# Patient Record
Sex: Female | Born: 1945
Health system: Southern US, Community
[De-identification: ages and names within clinical notes are randomized; demographics above are authoritative.]

## PROBLEM LIST (undated history)

## (undated) DIAGNOSIS — IMO0001 Reserved for inherently not codable concepts without codable children: Secondary | ICD-10-CM

## (undated) DIAGNOSIS — I10 Essential (primary) hypertension: Secondary | ICD-10-CM

## (undated) DIAGNOSIS — I739 Peripheral vascular disease, unspecified: Secondary | ICD-10-CM

## (undated) DIAGNOSIS — N289 Disorder of kidney and ureter, unspecified: Secondary | ICD-10-CM

## (undated) DIAGNOSIS — J439 Emphysema, unspecified: Secondary | ICD-10-CM

## (undated) DIAGNOSIS — K5790 Diverticulosis of intestine, part unspecified, without perforation or abscess without bleeding: Secondary | ICD-10-CM

## (undated) DIAGNOSIS — J449 Chronic obstructive pulmonary disease, unspecified: Secondary | ICD-10-CM

## (undated) DIAGNOSIS — F419 Anxiety disorder, unspecified: Secondary | ICD-10-CM

## (undated) HISTORY — DX: Essential (primary) hypertension: I10

## (undated) HISTORY — DX: Emphysema, unspecified: J43.9

## (undated) HISTORY — DX: Anxiety disorder, unspecified: F41.9

## (undated) HISTORY — DX: Peripheral vascular disease, unspecified: I73.9

---

## 2012-05-26 ENCOUNTER — Emergency Department (INDEPENDENT_AMBULATORY_CARE_PROVIDER_SITE_OTHER)
Admission: EM | Admit: 2012-05-26 | Discharge: 2012-05-26 | Disposition: A | Discharge status: Home / Self Care | Payer: Managed Care, Other (non HMO) | Source: Home / Self Care | Attending: Emergency Medicine | Admitting: Emergency Medicine

## 2012-05-26 DIAGNOSIS — J0100 Acute maxillary sinusitis, unspecified: Secondary | ICD-10-CM

## 2012-05-26 HISTORY — DX: Essential (primary) hypertension: I10

## 2012-05-26 HISTORY — DX: Chronic obstructive pulmonary disease, unspecified: J44.9

## 2012-05-26 MED ORDER — FLUTICASONE PROPIONATE 50 MCG/ACT NA SUSP: NASAL | Status: DC

## 2012-05-26 MED ORDER — SULFAMETHOXAZOLE-TRIMETHOPRIM 800-160 MG PO TABS: 1.0000 | ORAL_TABLET | Freq: Two times a day (BID) | ORAL | Status: AC

## 2012-05-26 NOTE — ED Provider Notes (Signed)
History     CSN: [id number]  Arrival date & time 05/26/12  1402   First MD Initiated Contact with Patient 05/26/12 1405      Chief Complaint  Patient presents with  . URI     The history is provided by the patient.  SINUSITIS  Onset: 3-4 days Facial/sinus pressure with discolored nasal mucus.    Severity: moderate Tried OTC meds without significant relief.  Symptoms:  + Fever  + URI prodrome with nasal congestion + Minimal swollen neck glands + mild Sinus Headache + mild ear pressure  No Allergy symptoms No significant Sore Throat No eye symptoms     No significant Cough No chest pain No shortness of breath  No wheezing She has a history of COPD, but she does not feel like this is an exacerbation. She quit smoking 18 months ago.  No Abdominal Pain No Nausea No Vomiting No diarrhea  No Myalgias No focal neurologic symptoms No syncope No Rash  No Urinary symptoms           Past Medical History  Diagnosis Date  . COPD (chronic obstructive pulmonary disease)   . Hypertension     History reviewed. No pertinent past surgical history.  Family History  Problem Relation Age of Onset  . Cancer Mother     History  Substance Use Topics  . Smoking status: Former Games developer  . Smokeless tobacco: Not on file  . Alcohol Use: No    OB History    Grav Para Term Preterm Abortions TAB SAB Ect Mult Living                  Review of Systems  All other systems reviewed and are negative.    Allergies  Codeine and Levaquin  Home Medications   Current Outpatient Rx  Name Route Sig Dispense Refill  . AMLODIPINE BESYLATE 10 MG PO TABS Oral Take 10 mg by mouth daily.    [name] TIOTROPIUM BROMIDE MONOHYDRATE 18 MCG IN CAPS Inhalation Place 18 mcg into inhaler and inhale daily.    [name] FLUTICASONE PROPIONATE 50 MCG/ACT NA SUSP  1 or 2 sprays each nostril twice a day 16 g 0  . SULFAMETHOXAZOLE-TRIMETHOPRIM 800-160 MG PO TABS Oral Take 1 tablet by mouth 2 (two)  times daily. 20 tablet 0    BP 172/76  Pulse 85  Temp 98 F (36.7 C) (Oral)  Resp 24  Ht 5\' 2"  (1.575 m)  Wt 106 lb 8 oz (48.308 kg)  BMI 19.48 kg/m2  SpO2 92% Repeat pulse ox 94% on room air, and she states her normal baseline is 92-94%. Physical Exam  Nursing note and vitals reviewed. Constitutional: She is oriented to person, place, and time. She appears well-developed and well-nourished. No distress.  HENT:  Head: Normocephalic and atraumatic.  Right Ear: Tympanic membrane, external ear and ear canal normal.  Left Ear: Tympanic membrane, external ear and ear canal normal.  Nose: Mucosal edema and rhinorrhea present. Right sinus exhibits maxillary sinus tenderness. Left sinus exhibits maxillary sinus tenderness.  Mouth/Throat: Oropharynx is clear and moist. No oral lesions. No oropharyngeal exudate.  Eyes: Right eye exhibits no discharge. Left eye exhibits no discharge. No scleral icterus.  Neck: Neck supple.  Cardiovascular: Normal rate, regular rhythm and normal heart sounds.   Pulmonary/Chest: Effort normal and breath sounds normal. She has no wheezes. She has no rales.  Lymphadenopathy:    She has no cervical adenopathy.  Neurological: She is alert and oriented  to person, place, and time.  Skin: Skin is warm and dry.   no cyanosis or edema in extremities.  ED Course  Procedures (including critical care time)  Labs Reviewed - No data to display No results found.   1. Acute maxillary sinusitis      MDM  We reviewed treatment options for the acute maxillary sinusitis. She is allergic to Levaquin, and she states that amoxicillin and penicillins and Z-Pak have not been effective for her in the past. After risks, benefits, alternatives discussed, I prescribed Septra DS twice a day x10 days. Flonase prescribed with instructions and precautions. Continue her other chronic baseline medications. Red flags discussed. She states that she and husband just moved to the area to  live closer to their children, and I advised that she establish with a PCP. Names and phone numbers of the PCPs here in the [facility] primary care office given.        Lajean Manes, MD 05/26/12 [phone]

## 2012-05-27 ENCOUNTER — Telehealth: Payer: Self-pay | Admitting: Emergency Medicine

## 2012-05-27 NOTE — ED Notes (Signed)
Patient called having severe abdominal pain from Septra.  Please prescribe something else.

## 2012-06-24 ENCOUNTER — Encounter: Payer: Self-pay | Admitting: Family Medicine

## 2012-06-24 ENCOUNTER — Ambulatory Visit (INDEPENDENT_AMBULATORY_CARE_PROVIDER_SITE_OTHER): Payer: Managed Care, Other (non HMO) | Admitting: Family Medicine

## 2012-06-24 VITALS — BP 148/76 | HR 93 | Wt 106.0 lb

## 2012-06-24 DIAGNOSIS — Z0000 Encounter for general adult medical examination without abnormal findings: Secondary | ICD-10-CM

## 2012-06-24 DIAGNOSIS — I739 Peripheral vascular disease, unspecified: Secondary | ICD-10-CM

## 2012-06-24 DIAGNOSIS — J439 Emphysema, unspecified: Secondary | ICD-10-CM

## 2012-06-24 DIAGNOSIS — J438 Other emphysema: Secondary | ICD-10-CM

## 2012-06-24 DIAGNOSIS — F419 Anxiety disorder, unspecified: Secondary | ICD-10-CM

## 2012-06-24 DIAGNOSIS — I10 Essential (primary) hypertension: Secondary | ICD-10-CM

## 2012-06-24 DIAGNOSIS — Z23 Encounter for immunization: Secondary | ICD-10-CM

## 2012-06-24 HISTORY — DX: Anxiety disorder, unspecified: F41.9

## 2012-06-24 HISTORY — DX: Essential (primary) hypertension: I10

## 2012-06-24 HISTORY — DX: Emphysema, unspecified: J43.9

## 2012-06-24 HISTORY — DX: Peripheral vascular disease, unspecified: I73.9

## 2012-06-24 NOTE — Progress Notes (Signed)
CC: Catherine Phillips is a 66 y.o. female is here for Establish Care   Subjective: HPI:  Pleasant 66 year old here to establish care, recently moved from [place].   Her only concern today is some mild transient tingling that occurs in all the fingertips and occasionally the toes is not precipitated by anything in particular, result was done without particular interventions. Has been present for weeks to years without any workup. She denies any other motor or sensory disturbances.  She has a history of emphysema and has been using spirivia without a daily cough nor shortness of breath at rest or with exertion. She's a former smoker stopped a little over a year ago after being hospitalized with pneumonia.  Get a history of essential hypertension for which she takes amlodipine. She denies headaches, chest pain, irregular heartbeat, peripheral edema, orthopnea, nor paroxysmal nocturnal dyspnea.  She tells me she has a history of left leg claudication, it sounds like she had ABIs done which she's unsure of the exact results. She tells me her former physician interpreted results as her only needing to start a daily walking regimen which she has abided by for years.  She denies any current claudication or rest pain and states that this is absent provided she walks on a daily basis. She's never been on medication for claudication. She can walk for over 30 minutes without pain in the legs, she can pretty much do whatever she wants without leg pain.  She's a history of anxiety that only bothers her once every one to 2 weeks and responds to Xanax. She tells me she uses this very sparingly, once every one to 2 weeks. She denies depression or mental disturbance or history of substance abuse/dependence other than tobacco.   Review Of Systems Outlined In HPI  Past Medical History  Diagnosis Date  . COPD (chronic obstructive pulmonary disease)   . Hypertension   . Emphysema of lung   . Anxiety  06/24/2012  . Essential hypertension 06/24/2012  . Emphysema 06/24/2012  . History of Left leg claudication 06/24/2012     Family History  Problem Relation Age of Onset  . Cancer Mother      History  Substance Use Topics  . Smoking status: Former Games developer  . Smokeless tobacco: Not on file  . Alcohol Use: No     Objective: Filed Vitals:   06/24/12 0958  BP: 148/76  Pulse: 93    General: Alert and Oriented, No Acute Distress HEENT: Pupils equal, round, reactive to light. Conjunctivae clear.  External ears unremarkable, canals clear with intact TMs with appropriate landmarks.  Middle ear appears open without effusion. Pink inferior turbinates.  Moist mucous membranes, pharynx without inflammation nor lesions.  Neck supple without palpable lymphadenopathy nor abnormal masses. Lungs: Clear to auscultation bilaterally, no wheezing/ronchi/rales.  Comfortable work of breathing. Good air movement. Cardiac: Regular rate and rhythm. Normal S1/S2.  No murmurs, rubs, nor gallops.  No carotid bruits. 1+ posterior tibialis pulse bilaterally. Extremities: No peripheral edema.  Mental Status: No depression, anxiety, nor agitation. Skin: Warm and dry.  Assessment & Plan: Catherine Phillips was seen today for establish care.  Diagnoses and associated orders for this visit:  Routine health maintenance - Lipid panel - COMPLETE METABOLIC PANEL WITH GFR - Flu vaccine greater than or equal to 3yo with preservative IM  History of left leg claudication  Emphysema  Essential hypertension  She'll return in approximately 2 weeks for a complete physical exam her blood pressure continues to be  elevated will adjust medications appropriately. She's due for cholesterol check in all rule out lateral abnormalities including calcium with a CMP due to her tingling. She will have these labs done right before her CPE. Her leg claudication some stable however went over signs and symptoms that would necessitate ABI testing,  we'll discuss aspirin use at her CPE visit. She continue on Spiriva I congratulated her on her tobacco cessation, emphysema sounds stable as she is asymptomatic.  Flu shot today   Return in about 2 weeks (around 07/08/2012) for CPE.

## 2012-06-25 LAB — COMPLETE METABOLIC PANEL WITH GFR
ALT: <8 U/L (ref 0–35)
CO2: 27 mEq/L (ref 19–32)
Creat: 0.77 mg/dL (ref 0.50–1.10)
GFR, Est African American: >89 mL/min
GFR, Est Non African American: 81 mL/min
Total Bilirubin: 0.4 mg/dL (ref 0.3–1.2)

## 2012-06-25 LAB — LIPID PANEL
HDL: 41 mg/dL (ref >39)
LDL Cholesterol: 201 mg/dL — ABNORMAL HIGH (ref 0–99)
Triglycerides: 298 mg/dL — ABNORMAL HIGH (ref <150)

## 2012-06-26 ENCOUNTER — Telehealth: Payer: Self-pay | Admitting: Family Medicine

## 2012-06-26 DIAGNOSIS — E785 Hyperlipidemia, unspecified: Secondary | ICD-10-CM | POA: Insufficient documentation

## 2012-06-26 MED ORDER — ATORVASTATIN CALCIUM 40 MG PO TABS: 40.0000 mg | ORAL_TABLET | Freq: Every day | ORAL | Status: DC

## 2012-06-26 NOTE — Telephone Encounter (Signed)
[place] 10 year risk of 13% if not considering her questionable history of PVD.  Agrees to start Lipitor.

## 2012-07-01 ENCOUNTER — Encounter: Payer: Self-pay | Admitting: Family Medicine

## 2012-07-01 ENCOUNTER — Ambulatory Visit (INDEPENDENT_AMBULATORY_CARE_PROVIDER_SITE_OTHER): Payer: Managed Care, Other (non HMO) | Admitting: Family Medicine

## 2012-07-01 VITALS — BP 134/68 | HR 74 | Temp 97.5°F | Resp 18 | Ht 60.5 in | Wt 106.0 lb

## 2012-07-01 DIAGNOSIS — Z0000 Encounter for general adult medical examination without abnormal findings: Secondary | ICD-10-CM

## 2012-07-01 MED ORDER — ZOSTER VACCINE LIVE 19400 UNT/0.65ML ~~LOC~~ SOLR: 0.6500 mL | Freq: Once | SUBCUTANEOUS | Status: DC

## 2012-07-01 NOTE — Progress Notes (Signed)
CC: Catherine Phillips is a 66 y.o. female is here for Annual Exam  Colonoscopy: Most recent was performed approximately 4 years ago patient is under the impression that she was given a 5 year clearance and will be due for colonoscopy next year. Papsmear:no history of abnormals, last one was two years ago, she is thinking about having this done with GYN Mammogram: no history of abnormals, she's due for this  DEXA: History of all normal, last one five years ago.  Will order 07/01/2012  Influenza Vaccine: received last week Pneumovax: Received after 65 Td/Tdap: She believes she had one 2 years ago Zoster: given rx 07/01/2012   Subjective: HPI:  She has no complaints today she is here for her annual physical exam.  Review of Systems - General ROS: negative for - chills, fever, night sweats, weight gain or weight loss Ophthalmic ROS: negative for - decreased vision Psychological ROS: negative for - anxiety or depression ENT ROS: negative for - hearing change, nasal congestion, tinnitus or allergies Hematological and Lymphatic ROS: negative for - bleeding problems, bruising or swollen lymph nodes Breast ROS: negative Respiratory ROS: no cough, shortness of breath, or wheezing Cardiovascular ROS: no chest pain or dyspnea on exertion Gastrointestinal ROS: no abdominal pain, change in bowel habits, or black or bloody stools Genito-Urinary ROS: negative for - genital discharge, genital ulcers, incontinence or abnormal bleeding from genitals Musculoskeletal ROS: negative for - joint pain or muscle pain Neurological ROS: negative for - headaches or memory loss Dermatological ROS: negative for lumps, mole changes, rash and skin lesion changes  Past Medical History  Diagnosis Date  . COPD (chronic obstructive pulmonary disease)   . Hypertension   . Emphysema of lung   . Anxiety 06/24/2012  . Essential hypertension 06/24/2012  . Emphysema 06/24/2012  . History of Left leg claudication 06/24/2012     Family History  Problem Relation Age of Onset  . Cancer Mother   . Heart attack Father      History  Substance Use Topics  . Smoking status: Former Smoker -- 1.5 packs/day for 30 years    Types: Cigarettes    Quit date: 12/30/2009  . Smokeless tobacco: Not on file  . Alcohol Use: No     Objective: Filed Vitals:   07/01/12 0934  BP: 134/68  Pulse: 74  Temp: 97.5 F (36.4 C)  Resp: 18    General: No Acute Distress HEENT: Atraumatic, normocephalic, conjunctivae normal without scleral icterus.  No nasal discharge, hearing grossly intact, TMs with good landmarks bilaterally with no middle ear abnormalities, posterior pharynx clear without oral lesions. Neck: Supple, trachea midline, no cervical nor supraclavicular adenopathy. Pulmonary: Clear to auscultation bilaterally without wheezing, rhonchi, nor rales. Cardiac: Regular rate and rhythm.  No murmurs, rubs, nor gallops. No peripheral edema.  2+ peripheral pulses bilaterally. Abdomen: Bowel sounds normal.  No masses.  Non-tender without rebound.  Negative Murphy's sign. GU: Declined by patient  MSK: Grossly intact, no signs of weakness.  Full strength throughout upper and lower extremities.  Full ROM in upper and lower extremities.  No midline spinal tenderness. Neuro: Gait unremarkable, CN II-XII grossly intact.  C5-C6 Reflex 2/4 Bilaterally, L4 Reflex 2/4 Bilaterally.  Cerebellar function intact. Skin: No rashes. Psych: Alert and oriented to person/place/time.  Thought process normal. No anxiety/depression.   Assessment & Plan: Catherine Phillips was seen today for annual exam.  Diagnoses and associated orders for this visit:  Encounter for health maintenance examination - zoster vaccine live, PF, (ZOSTAVAX) 16109  UNT/0.65ML injection; Inject 19,400 Units into the skin once. - MM Digital Screening; Future - DG Bone Density; Future  Other Orders - fluticasone (FLONASE) 50 MCG/ACT nasal spray; as needed. 1 or 2 sprays each  nostril twice a day    Health maintenance topics were updated about the history of present illness. She's due for mammogram, DEXA, Zostavax. We'll arrange that for her today and have the procedures done in the near future. Handout on health maintenance topics including diet exercise and healthy lifestyles was given to the patient along with discussing this face-to-face. Asked her to return in 3 months for fasting cholesterol checked since she started Lipitor last week.  Return in about 3 months (around 10/01/2012).

## 2012-07-01 NOTE — Patient Instructions (Addendum)
Dr. Erlene Devita's General Advice Following Your Complete Physical Exam  The Benefits of Regular Exercise: Unless you suffer from an uncontrolled cardiovascular condition, studies strongly suggest that regular exercise and physical activity will add to both the quality and length of your life.  The World Health Organization recommends 150 minutes of moderate intensity aerobic activity every week.  This is best split over 3-4 days a week, and can be as simple as a brisk walk for just over 35 minutes "most days of the week".  This type of exercise has been shown to lower LDL-Cholesterol, lower average blood sugars, lower blood pressure, lower cardiovascular disease risk, improve memory, and increase one's overall sense of wellbeing.  The addition of anaerobic (or "strength training") exercises offers additional benefits including but not limited to increased metabolism, prevention of osteoporosis, and improved overall cholesterol levels.  How Can I Strive For A Low-Fat Diet?: Current guidelines recommend that 25-35 percent of your daily energy (food) intake should come from fats.  One might ask how can this be achieved without having to dissect each meal on a daily basis?  Switch to skim or 1% milk instead of whole milk.  Focus on lean meats such as ground turkey, fresh fish, baked chicken, and lean cuts of beef as your source of dietary protein.  Consume less than 300mg/day of dietary cholesterol.  Limit trans fatty acid consumption primarily by limiting synthetic trans fats such as partially hydrogenated oils (Ex: fried fast foods).  Focus efforts on reducing your intake of "solid" fats (Ex: Butter).  Substitute olive or vegetable oil for solid fats where possible.  Moderation of Salt Intake: Provided you don't carry a diagnosis of congestive heart failure nor renal failure, I recommend a daily allowance of no more than 2300 mg of salt (sodium).  Keeping under this daily goal is associated with a  decreased risk of cardiovascular events, creeping above it can lead to elevated blood pressures and increases your risk of cardiovascular events.  Milligrams (mg) of salt is listed on all nutrition labels, and your daily intake can add up faster than you think.  Most canned and frozen dinners can pack in over half your daily salt allowance in one meal.    Lifestyle Health Risks: Certain lifestyle choices carry specific health risks.  As you may already know, tobacco use has been associated with increasing one's risk of cardiovascular disease, pulmonary disease, numerous cancers, among many other issues.  What you may not know is that there are medications and nicotine replacement strategies that can more than double your chances of successfully quitting.  I would be thrilled to help manage your quitting strategy if you currently use tobacco products.  When it comes to alcohol use, I've yet to find an "ideal" daily allowance.  Provided an individual does not have a medical condition that is exacerbated by alcohol consumption, general guidelines determine "safe drinking" as no more than two standard drinks for a man or no more than one standard drink for a female per day.  However, much debate still exists on whether any amount of alcohol consumption is technically "safe".  My general advice, keep alcohol consumption to a minimum for general health promotion.  If you or others believe that alcohol, tobacco, or recreational drug use is interfering with your life, I would be happy to provide confidential counseling regarding treatment options.  General "Over The Counter" Nutrition Advice: Postmenopausal women should aim for a daily calcium intake of 1200 mg, however a significant   portion of this might already be provided by diets including milk, yogurt, cheese, and other dairy products.  Vitamin D has been shown to help preserve bone density, prevent fatigue, and has even been shown to help reduce falls in the  elderly.  Ensuring a daily intake of 800 Units of Vitamin D is a good place to start to enjoy the above benefits, we can easily check your Vitamin D level to see if you'd potentially benefit from supplementation beyond 800 Units a day.  Folic Acid intake should be of particular concern to women of childbearing age.  Daily consumption of 400-800 mcg of Folic Acid is recommended to minimize the chance of spinal cord defects in a fetus should pregnancy occur.    For many adults, accidents still remain one of the most common culprits when it comes to cause of death.  Some of the simplest but most effective preventitive habits you can adopt include regular seatbelt use, proper helmet use, securing firearms, and regularly testing your smoke and carbon monoxide detectors.  Bryleigh Ottaway B. Vonnetta Akey DO [facility] [address], [address] [place], [place] [address] Phone: [phone]  

## 2012-07-04 ENCOUNTER — Ambulatory Visit (INDEPENDENT_AMBULATORY_CARE_PROVIDER_SITE_OTHER): Payer: Managed Care, Other (non HMO)

## 2012-07-04 DIAGNOSIS — Z1231 Encounter for screening mammogram for malignant neoplasm of breast: Secondary | ICD-10-CM

## 2012-07-04 DIAGNOSIS — Z0000 Encounter for general adult medical examination without abnormal findings: Secondary | ICD-10-CM

## 2012-08-29 ENCOUNTER — Encounter: Payer: Self-pay | Admitting: Sports Medicine

## 2012-08-29 ENCOUNTER — Ambulatory Visit (INDEPENDENT_AMBULATORY_CARE_PROVIDER_SITE_OTHER): Payer: Managed Care, Other (non HMO)

## 2012-08-29 ENCOUNTER — Ambulatory Visit (INDEPENDENT_AMBULATORY_CARE_PROVIDER_SITE_OTHER): Payer: Managed Care, Other (non HMO) | Admitting: Sports Medicine

## 2012-08-29 VITALS — BP 159/82 | HR 96 | Temp 97.8°F | Wt 109.0 lb

## 2012-08-29 DIAGNOSIS — R059 Cough, unspecified: Secondary | ICD-10-CM

## 2012-08-29 DIAGNOSIS — J439 Emphysema, unspecified: Secondary | ICD-10-CM

## 2012-08-29 DIAGNOSIS — R05 Cough: Secondary | ICD-10-CM

## 2012-08-29 DIAGNOSIS — J438 Other emphysema: Secondary | ICD-10-CM

## 2012-08-29 DIAGNOSIS — R918 Other nonspecific abnormal finding of lung field: Secondary | ICD-10-CM

## 2012-08-29 DIAGNOSIS — R509 Fever, unspecified: Secondary | ICD-10-CM

## 2012-08-29 MED ORDER — PREDNISONE 50 MG PO TABS: 50.0000 mg | ORAL_TABLET | Freq: Every day | ORAL | Status: DC

## 2012-08-29 MED ORDER — METHYLPREDNISOLONE ACETATE 40 MG/ML IJ SUSP
250.0000 mg | Freq: Once | INTRAMUSCULAR | Status: AC
  Administered 2012-08-29: 250 mg via INTRAMUSCULAR

## 2012-08-29 MED ORDER — BENZONATATE 200 MG PO CAPS: 200.0000 mg | ORAL_CAPSULE | Freq: Three times a day (TID) | ORAL | Status: DC | PRN

## 2012-08-29 MED ORDER — DOXYCYCLINE HYCLATE 100 MG PO TABS: 100.0000 mg | ORAL_TABLET | Freq: Two times a day (BID) | ORAL | Status: AC

## 2012-08-29 NOTE — Assessment & Plan Note (Addendum)
Currently in exacerbation. Solu-Medrol 250 mg intramuscular, duo nebs. Doxycycline, chest x-ray, prednisone for 5 days. Benzonatate for cough. She'll come back to see [place] if no better by tomorrow.

## 2012-08-29 NOTE — Progress Notes (Signed)
Subjective:    CC: Cough  HPI: This is a very pleasant 66 year old female with a history of emphysema comes in with a several-day history of increasing cough with sputum production, shortness of breath, and upper respiratory symptoms. The cough is fairly severe, causes for mild shortness of breath, and is productive of yellowish sputum without blood. It does bother her at night as well. She has also noted increased use of both her inhaler, and albuterol nebulizers. When not sick, she notes that she is asymptomatic. She denies fevers, chills, night sweats, weight loss, GI symptoms, rash.  Past medical history, Surgical history, Family history, Social history, Allergies, and medications have been entered into the medical record, reviewed, and no changes needed.   Review of Systems: No fevers, chills, night sweats, weight loss, chest pain, or shortness of breath.   Objective:    General: Well Developed, well nourished, and in no acute distress.  Neuro: Alert and oriented x3, extra-ocular muscles intact.  HEENT: Normocephalic, atraumatic, pupils equal round reactive to light, neck supple, no masses, no lymphadenopathy, thyroid nonpalpable. Oropharynx, nasopharynx, tympanic membranes are unremarkable to inspection. Skin: Warm and dry, no rashes. Cardiac: Regular rate and rhythm, no murmurs rubs or gallops.  Respiratory: Clear to auscultation bilaterally. Not using accessory muscles, speaking in full sentences. Coughing occasionally in exam room.  Solu-Medrol 250 mg intramuscular, and albuterol/Atrovent nebulizer treatment given in the office, patient felt better.  Oxygen saturation improved from 91 to 94%. This was on room air.  Impression and Recommendations:

## 2012-09-02 ENCOUNTER — Ambulatory Visit (INDEPENDENT_AMBULATORY_CARE_PROVIDER_SITE_OTHER): Payer: Managed Care, Other (non HMO) | Admitting: Family Medicine

## 2012-09-02 ENCOUNTER — Encounter: Payer: Self-pay | Admitting: Family Medicine

## 2012-09-02 VITALS — BP 140/80 | Wt 110.0 lb

## 2012-09-02 DIAGNOSIS — Z13820 Encounter for screening for osteoporosis: Secondary | ICD-10-CM

## 2012-09-02 DIAGNOSIS — J439 Emphysema, unspecified: Secondary | ICD-10-CM

## 2012-09-02 DIAGNOSIS — J438 Other emphysema: Secondary | ICD-10-CM

## 2012-09-02 NOTE — Progress Notes (Signed)
CC: Catherine Phillips is a 66 y.o. female is here for discuss oxygen for the home   Subjective: HPI:  Patient presents due to concerns of an oxygen saturation of 72% at the last visit, last week.  She has been on oxygen the past during her recovery from a pneumonia. Since starting doxycycline and prednisone last week she states her breathing is fantastic, she denies fevers, chills, shortness of breath, chest tightness, cough, chest pain, nor back pain. She's been using her albuterol 2 to 3 times a day, sometimes using the nebulizer because she believes works better. She continues to Spiriva twice a day. She does not believe that she's never been on an inhaled corticosteroid.   Review Of Systems Outlined In HPI  Past Medical History  Diagnosis Date  . COPD (chronic obstructive pulmonary disease)   . Hypertension   . Emphysema of lung   . Anxiety 06/24/2012  . Essential hypertension 06/24/2012  . Emphysema 06/24/2012  . History of Left leg claudication 06/24/2012     Family History  Problem Relation Age of Onset  . Cancer Mother   . Heart attack Father      History  Substance Use Topics  . Smoking status: Former Smoker -- 1.5 packs/day for 30 years    Types: Cigarettes    Quit date: 12/30/2009  . Smokeless tobacco: Not on file  . Alcohol Use: No     Objective: Filed Vitals:   09/02/12 0917  BP: 140/80    General: Alert and Oriented, No Acute Distress HEENT: Pupils equal, round, reactive to light. Conjunctivae clear.   Moist mucous membranes, pharynx without inflammation nor lesions.  Neck supple without palpable lymphadenopathy nor abnormal masses. Lungs: Clear to auscultation bilaterally, no wheezing/ronchi/rales.  Comfortable work of breathing. Good air movement. Cardiac: Regular rate and rhythm. Normal S1/S2.  No murmurs, rubs, nor gallops.   Extremities: No peripheral edema.  Strong peripheral pulses.  Mental Status: No depression, anxiety, nor agitation. Skin: Warm and  dry.  Assessment & Plan: Catherine Phillips was seen today for discuss oxygen for the home.  Diagnoses and associated orders for this visit:  Screening for osteoporosis - DG Bone Density; Future  Emphysema  Walking and resting sats favorable, discussed the patient insurance would unlikely cover oxygen, however I don't think she needs at this time from an objective standpoint especially without subjective shortness of breath right now.  If she starts needing albuterol 3 more times a day on a consistent basis I like her to return for consideration of inhaled corticosteroids. There was a scheduling issue with her last bone density, replaced ordered today    Return in about 4 weeks (around 09/30/2012) for Breathing Recheck.

## 2012-09-03 ENCOUNTER — Ambulatory Visit (INDEPENDENT_AMBULATORY_CARE_PROVIDER_SITE_OTHER): Payer: Managed Care, Other (non HMO)

## 2012-09-03 DIAGNOSIS — M810 Age-related osteoporosis without current pathological fracture: Secondary | ICD-10-CM

## 2012-09-03 DIAGNOSIS — Z13820 Encounter for screening for osteoporosis: Secondary | ICD-10-CM

## 2012-09-04 ENCOUNTER — Telehealth: Payer: Self-pay | Admitting: Family Medicine

## 2012-09-04 DIAGNOSIS — M810 Age-related osteoporosis without current pathological fracture: Secondary | ICD-10-CM | POA: Insufficient documentation

## 2012-09-04 MED ORDER — ALENDRONATE SODIUM 70 MG PO TABS: 70.0000 mg | ORAL_TABLET | ORAL | Status: DC

## 2012-09-04 NOTE — Telephone Encounter (Signed)
Notified patient of osteoporosis, starting fosamax

## 2012-09-30 ENCOUNTER — Other Ambulatory Visit: Payer: Self-pay | Admitting: *Deleted

## 2012-09-30 MED ORDER — AMLODIPINE BESYLATE 10 MG PO TABS: 5.0000 mg | ORAL_TABLET | Freq: Every day | ORAL | Status: DC

## 2012-10-04 ENCOUNTER — Encounter: Payer: Self-pay | Admitting: Family Medicine

## 2012-10-04 ENCOUNTER — Ambulatory Visit (INDEPENDENT_AMBULATORY_CARE_PROVIDER_SITE_OTHER): Payer: Managed Care, Other (non HMO) | Admitting: Family Medicine

## 2012-10-04 VITALS — BP 150/76 | HR 87 | Wt 111.0 lb

## 2012-10-04 DIAGNOSIS — M810 Age-related osteoporosis without current pathological fracture: Secondary | ICD-10-CM

## 2012-10-04 DIAGNOSIS — I10 Essential (primary) hypertension: Secondary | ICD-10-CM

## 2012-10-04 DIAGNOSIS — J438 Other emphysema: Secondary | ICD-10-CM

## 2012-10-04 DIAGNOSIS — J439 Emphysema, unspecified: Secondary | ICD-10-CM

## 2012-10-04 DIAGNOSIS — K219 Gastro-esophageal reflux disease without esophagitis: Secondary | ICD-10-CM | POA: Insufficient documentation

## 2012-10-04 MED ORDER — RANITIDINE HCL 150 MG PO TABS: 150.0000 mg | ORAL_TABLET | Freq: Two times a day (BID) | ORAL | Status: DC | PRN

## 2012-10-04 MED ORDER — AMLODIPINE BESYLATE 10 MG PO TABS: 10.0000 mg | ORAL_TABLET | Freq: Every day | ORAL | Status: DC

## 2012-10-04 NOTE — Progress Notes (Signed)
CC: Catherine Phillips is a 67 y.o. female is here for recheck breathing and bone density   Subjective: HPI:  Osteoporosis: Bone density anaplasty reflected osteoporosis. She started weekly Fosamax and is taking adequate calcium and vitamin D supplementation. She reports correct administration of Fosamax. Denies bone or joint discomfort since starting this regimen. Denies recent falls nor jaw pain.. Emphysema: Reports breathing is greatly improved since requiring steroids and antibiotic last month. Denies shortness of breath, wheezing, chest pain, chest tightness, cough, nor lightheadedness. Using albuterol only once a week usually when out in the community. No longer smoking. Reports a burning sensation in the back of her throat that radiates down to her chest to the epigastric region one to 2 times a week. Has been present for months, has not worsened since starting Fosamax. Notices its worse if she lies down after eating a large meal. Denies trouble swallowing or regurgitation. No nausea no vomiting no abdominal pain. Symptoms are described as mild in severity Essential hypertension: On multiple visits her blood pressure has been in stage I range. She tries to watch her she eats but admits to a high salt diet which she is unable to cut out. Taking amlodipine 5 mg a day. No outside blood pressures to report Denies motor or sensory disturbances, irregular heartbeat, chest pain, orthopnea, peripheral edema, nor PND   Review Of Systems Outlined In HPI  Past Medical History  Diagnosis Date  . COPD (chronic obstructive pulmonary disease)   . Hypertension   . Emphysema of lung   . Anxiety 06/24/2012  . Essential hypertension 06/24/2012  . Emphysema 06/24/2012  . History of Left leg claudication 06/24/2012     Family History  Problem Relation Age of Onset  . Cancer Mother   . Heart attack Father      History  Substance Use Topics  . Smoking status: Former Smoker -- 1.5 packs/day for 30 years   Types: Cigarettes    Quit date: 12/30/2009  . Smokeless tobacco: Not on file  . Alcohol Use: No     Objective: Filed Vitals:   10/04/12 0921  BP: 150/76  Pulse: 87    General: Alert and Oriented, No Acute Distress HEENT: Pupils equal, round, reactive to light. Conjunctivae clear. Moist mucous membranes, pharynx without inflammation nor lesions.  Neck supple without palpable lymphadenopathy nor abnormal masses. Lungs: Clear to auscultation bilaterally, no wheezing/ronchi/rales.  Comfortable work of breathing. Good air movement. Cardiac: Regular rate and rhythm. Normal S1/S2.  No murmurs, rubs, nor gallops.   Abdomen: Soft nontender Extremities: No peripheral edema.  Strong peripheral pulses.  Mental Status: No depression, anxiety, nor agitation. Skin: Warm and dry.  Assessment & Plan: Lisvet was seen today for recheck breathing and bone density.  Diagnoses and associated orders for this visit:  Osteoporosis  Emphysema  Essential hypertension - amLODipine (NORVASC) 10 MG tablet; Take 1 tablet (10 mg total) by mouth daily.  Gerd (gastroesophageal reflux disease)    Osteoporosis: Clinically stable, continue current bisphosphonate calcium and vitamin D Emphysema: Continue Spiriva and when necessary albuterol, controlled therefore we'll hold off on inhaled corticosteroid Essential hypertension: Uncontrolled, increasing amlodipine to 10 mg a day. GERD: Discussed lifestyle interventions to minimize reflux symptoms, advised to use ranitidine as needed  Return in about 4 weeks (around 11/01/2012).

## 2012-10-28 ENCOUNTER — Other Ambulatory Visit: Payer: Self-pay | Admitting: *Deleted

## 2012-10-28 MED ORDER — ALENDRONATE SODIUM 70 MG PO TABS: 70.0000 mg | ORAL_TABLET | ORAL | Status: DC

## 2012-11-04 ENCOUNTER — Ambulatory Visit (INDEPENDENT_AMBULATORY_CARE_PROVIDER_SITE_OTHER): Payer: Managed Care, Other (non HMO) | Admitting: Family Medicine

## 2012-11-04 ENCOUNTER — Encounter: Payer: Self-pay | Admitting: Family Medicine

## 2012-11-04 VITALS — BP 142/66 | HR 92 | Wt 115.0 lb

## 2012-11-04 DIAGNOSIS — I10 Essential (primary) hypertension: Secondary | ICD-10-CM

## 2012-11-04 DIAGNOSIS — I739 Peripheral vascular disease, unspecified: Secondary | ICD-10-CM

## 2012-11-04 DIAGNOSIS — E785 Hyperlipidemia, unspecified: Secondary | ICD-10-CM

## 2012-11-04 MED ORDER — HYDROCHLOROTHIAZIDE 25 MG PO TABS: 25.0000 mg | ORAL_TABLET | Freq: Every day | ORAL | Status: DC

## 2012-11-04 MED ORDER — PENTOXIFYLLINE ER 400 MG PO TBCR: 400.0000 mg | EXTENDED_RELEASE_TABLET | Freq: Three times a day (TID) | ORAL | Status: DC

## 2012-11-04 NOTE — Progress Notes (Signed)
CC: Catherine Phillips is a 67 y.o. female is here for Hypertension   Subjective: HPI:  Followup hypertension: No outside blood pressures to report, she denies side effects since increasing amlodipine to 10 mg. She denies headaches, vision changes, motor sensory disturbances, chest pain, orthopnea, peripheral edema, irregular heartbeat, headaches  Followup hyperlipidemia: In October she was found to have an LDL of 201. She has started Lipitor without right upper quadrant pain, myalgias, skin or scleral discoloration. No major diet changes, she tries to walk on a daily basis for exercise.  She complains of right leg pain that has been present for about a month. It only occurs when she walks long distances. Pain resolves within minutes after resting. Is described as a cramping and burning sensation in the anterior and posterior left lower extremity is nonradiating. It is moderate in severity. Nothing else makes better or worse. She tells me she was told there is narrowing of blood vessels in this leg but there have been no interventions as of yet. She believes it's been a year since her last ABI. She denies exertional chest pain. She denies skin changes at the site of her discomfort nor joint pain in the affected leg.  Review Of Systems Outlined In HPI  Past Medical History  Diagnosis Date  . COPD (chronic obstructive pulmonary disease)   . Hypertension   . Emphysema of lung   . Anxiety 06/24/2012  . Essential hypertension 06/24/2012  . Emphysema 06/24/2012  . History of Left leg claudication 06/24/2012     Family History  Problem Relation Age of Onset  . Cancer Mother   . Heart attack Father      History  Substance Use Topics  . Smoking status: Former Smoker -- 1.50 packs/day for 30 years    Types: Cigarettes    Quit date: 12/30/2009  . Smokeless tobacco: Not on file  . Alcohol Use: No     Objective: Filed Vitals:   11/04/12 0851  BP: 142/66  Pulse: 92    General: Alert and  Oriented, No Acute Distress HEENT: Pupils equal, round, reactive to light. Conjunctivae clear. Moist mucous membranes, pharynx without inflammation nor lesions.  Neck supple  Lungs: Clear to auscultation bilaterally, no wheezing/ronchi/rales.  Comfortable work of breathing. Good air movement. Cardiac: Regular rate and rhythm. Normal S1/S2.  No murmurs, rubs, nor gallops.   Extremities: No peripheral edema.  1+ left posterior tibialis pulse. No skin changes in the left lower extremity, full range of motion and strength in the left leg. Mental Status: No depression, anxiety, nor agitation. Skin: Warm and dry.  Assessment & Plan: Keneshia was seen today for hypertension.  Diagnoses and associated orders for this visit:  Left leg claudication - pentoxifylline (TRENTAL) 400 MG CR tablet; Take 1 tablet (400 mg total) by mouth 3 (three) times daily with meals. - aspirin EC 325 MG tablet; Take 1 tablet (325 mg total) by mouth daily. - Ambulatory referral to Vascular Surgery  Other and unspecified hyperlipidemia - Lipid panel  Essential hypertension, benign - hydrochlorothiazide (HYDRODIURIL) 25 MG tablet; Take 1 tablet (25 mg total) by mouth daily.    Left leg claudication: Worsening symptoms, starting pentoxifylline until she can be established with a local vascular surgeon team, she's been taking a baby aspirin a day I asked her to increase this to a full aspirin daily. Essential hypertension: Uncontrolled, continue amlodipine and start hydrochlorothiazide and Hyperlipidemia: Due for lipid panel with goal LDL less than 70  25 minutes spent  face-to-face during visit today of which at least 50% was counseling or coordinating care regarding leg claudication, hyperlipidemia, essential hypertension.   Return in about 4 weeks (around 12/02/2012).

## 2012-11-05 LAB — LIPID PANEL
HDL: 45 mg/dL (ref >39)
LDL Cholesterol: 108 mg/dL — ABNORMAL HIGH (ref 0–99)
Triglycerides: 176 mg/dL — ABNORMAL HIGH (ref <150)

## 2012-11-06 ENCOUNTER — Telehealth: Payer: Self-pay | Admitting: Family Medicine

## 2012-11-06 DIAGNOSIS — E785 Hyperlipidemia, unspecified: Secondary | ICD-10-CM

## 2012-11-06 MED ORDER — ATORVASTATIN CALCIUM 80 MG PO TABS: 80.0000 mg | ORAL_TABLET | Freq: Every day | ORAL | Status: DC

## 2012-11-06 NOTE — Telephone Encounter (Signed)
Pt was not at home when I spoke with husband will call again later

## 2012-11-06 NOTE — Telephone Encounter (Signed)
Catherine Phillips, Will you please let Catherine Phillips know that there has been a remarkable improvement in her LDL cholesterol and her cholesterol numbers overall.  She has cut her LDL cholesterol in half from 201 in October.  Her goal is less than 70, a more aggressive goal compared to her husband because of the arterial narrowing in her legs.  I've sent in a Rx for Lipitor 80mg  to replace her current reigmen of 40mg  in hopes that this will get her to goal.  I'm quite pleased with her improvement though. (there is an additional phone note for her husband today)

## 2012-11-08 NOTE — Telephone Encounter (Signed)
Pt.notified

## 2012-11-13 ENCOUNTER — Other Ambulatory Visit: Payer: Self-pay | Admitting: *Deleted

## 2012-11-13 DIAGNOSIS — I70219 Atherosclerosis of native arteries of extremities with intermittent claudication, unspecified extremity: Secondary | ICD-10-CM

## 2012-11-19 ENCOUNTER — Ambulatory Visit (INDEPENDENT_AMBULATORY_CARE_PROVIDER_SITE_OTHER): Payer: Managed Care, Other (non HMO) | Admitting: Family Medicine

## 2012-11-19 ENCOUNTER — Ambulatory Visit: Payer: Managed Care, Other (non HMO) | Admitting: Family Medicine

## 2012-11-19 ENCOUNTER — Encounter: Payer: Self-pay | Admitting: Family Medicine

## 2012-11-19 VITALS — BP 144/79 | HR 88 | Wt 113.0 lb

## 2012-11-19 DIAGNOSIS — E785 Hyperlipidemia, unspecified: Secondary | ICD-10-CM

## 2012-11-19 DIAGNOSIS — IMO0001 Reserved for inherently not codable concepts without codable children: Secondary | ICD-10-CM

## 2012-11-19 DIAGNOSIS — M791 Myalgia, unspecified site: Secondary | ICD-10-CM

## 2012-11-19 MED ORDER — PITAVASTATIN CALCIUM 4 MG PO TABS: ORAL_TABLET | ORAL | Status: DC

## 2012-11-19 NOTE — Progress Notes (Signed)
CC: Catherine Phillips is a 67 y.o. female is here for Generalized Body Aches   Subjective: HPI:  Patient presents with complaints of fatigue and generalized body aches. This occurred one to 2 days after she was switched from 40 mg to 80 mg of Lipitor. She describes her symptoms as moderate in severity. Interestingly this occurred on a daily basis except for today. Nothing seems to make the above symptoms better or worse, they're present 24 hours a day. She denies right upper quadrant pain, fevers, shortness of breath, cough, chills, right upper quadrant pain, muscle weakness, nor darkening color of the urine. She denies skin or scleral discoloration    Review Of Systems Outlined In HPI  Past Medical History  Diagnosis Date  . COPD (chronic obstructive pulmonary disease)   . Hypertension   . Emphysema of lung   . Anxiety 06/24/2012  . Essential hypertension 06/24/2012  . Emphysema 06/24/2012  . History of Left leg claudication 06/24/2012     Family History  Problem Relation Age of Onset  . Cancer Mother   . Heart attack Father      History  Substance Use Topics  . Smoking status: Former Smoker -- 1.50 packs/day for 30 years    Types: Cigarettes    Quit date: 12/30/2009  . Smokeless tobacco: Not on file  . Alcohol Use: No     Objective: Filed Vitals:   11/19/12 1052  BP: 144/79  Pulse: 88    General: Alert and Oriented, No Acute Distress HEENT: Pupils equal, round, reactive to light. Conjunctivae clear.  Moist mucous membranes Lungs: Clear to auscultation bilaterally, no wheezing/ronchi/rales.  Comfortable work of breathing. Good air movement. Cardiac: Regular rate and rhythm. Normal S1/S2.  No murmurs, rubs, nor gallops.   Abdomen: No right upper quadrant pain Extremities: No peripheral edema.  Strong peripheral pulses.  Mental Status: No depression, anxiety, nor agitation. Skin: Warm and dry.  Assessment & Plan: Catherine Phillips was seen today for generalized body  aches.  Diagnoses and associated orders for this visit:  Hyperlipidemia LDL goal <70 - Pitavastatin Calcium (LIVALO) 4 MG TABS; One tab daily for cholesterol control.    Discussed with patient that it is quite likely the increase in Lipitor was causing myalgias, we will see if insurance will cover livalo. If not we'll consider trying Zocor or returning back to 40 mg of Lipitor.  Discussed the possibility of getting creatinine kinase and LFTs however since this will not change management for medication adjustments today and since she is a symptomatic right now patient prefers not to have this done.  Return in about 4 weeks (around 12/17/2012).

## 2012-12-02 ENCOUNTER — Ambulatory Visit: Payer: Managed Care, Other (non HMO) | Admitting: Family Medicine

## 2012-12-03 ENCOUNTER — Encounter: Payer: Self-pay | Admitting: Vascular Surgery

## 2012-12-04 ENCOUNTER — Ambulatory Visit (INDEPENDENT_AMBULATORY_CARE_PROVIDER_SITE_OTHER): Payer: Managed Care, Other (non HMO) | Admitting: Vascular Surgery

## 2012-12-04 ENCOUNTER — Other Ambulatory Visit: Payer: Self-pay

## 2012-12-04 ENCOUNTER — Encounter (HOSPITAL_COMMUNITY): Payer: Self-pay | Admitting: Pharmacy Technician

## 2012-12-04 ENCOUNTER — Encounter (INDEPENDENT_AMBULATORY_CARE_PROVIDER_SITE_OTHER): Payer: Managed Care, Other (non HMO)

## 2012-12-04 ENCOUNTER — Encounter: Payer: Self-pay | Admitting: Vascular Surgery

## 2012-12-04 VITALS — BP 131/78 | HR 78 | Resp 16 | Ht 62.0 in | Wt 112.0 lb

## 2012-12-04 DIAGNOSIS — I739 Peripheral vascular disease, unspecified: Secondary | ICD-10-CM

## 2012-12-04 DIAGNOSIS — I70219 Atherosclerosis of native arteries of extremities with intermittent claudication, unspecified extremity: Secondary | ICD-10-CM | POA: Insufficient documentation

## 2012-12-04 DIAGNOSIS — M79609 Pain in unspecified limb: Secondary | ICD-10-CM | POA: Insufficient documentation

## 2012-12-04 NOTE — Progress Notes (Signed)
[facility]   Patient name: Catherine Phillips MRN: [id number] DOB: 11/22/1945 Sex: female  REASON FOR CONSULT: left lower extremity claudication referred by Dr. Sean Hommel  HPI: Taleeya Kosch is a 67 y.o. female who noted the gradual onset of left lower extremity claudication at proximally one year ago. Initially her symptoms were in the left calf. She now experiences symptoms in the calf and left thigh. Her symptoms occur approximately one block. They are brought on by ambulation and relieved with rest. They have been gradually progressive. She denies any history of rest pain or history of nonhealing ulcers. There are no other aggravating or alleviating factors.  She does have a history of mildly elevated cholesterol and is currently off of Lipitor as this caused nausea. In addition she has hypertension which is been well controlled. She denies any history of diabetes, history of previous myocardial infarction or history of congestive heart failure.  Past Medical History  Diagnosis Date  . COPD (chronic obstructive pulmonary disease)   . Hypertension   . Emphysema of lung   . Anxiety 06/24/2012  . Essential hypertension 06/24/2012  . Emphysema 06/24/2012  . History of Left leg claudication 06/24/2012    Family History  Problem Relation Age of Onset  . Cancer Mother   . Heart attack Father   . Deep vein thrombosis Father   . Hyperlipidemia Father   . Hypertension Father    She is unaware of any history of premature cardiovascular disease.  SOCIAL HISTORY: History  Substance Use Topics  . Smoking status: Former Smoker -- 1.50 packs/day for 30 years    Types: Cigarettes    Quit date: 12/30/2009  . Smokeless tobacco: Never Used  . Alcohol Use: No    Allergies  Allergen Reactions  . Codeine Nausea And Vomiting  . Levaquin (Levofloxacin In D5w) Itching and Rash    Current Outpatient Prescriptions  Medication Sig Dispense Refill  . albuterol (ACCUNEB)  1.25 MG/3ML nebulizer solution Take 1 ampule by nebulization every 6 (six) hours as needed.      . alendronate (FOSAMAX) 70 MG tablet Take 1 tablet (70 mg total) by mouth every 7 (seven) days. Take with a full glass of water on an empty stomach.  12 tablet  2  . ALPRAZolam (XANAX) 0.5 MG tablet Take 0.5 mg by mouth as needed.      . amLODipine (NORVASC) 10 MG tablet Take 1 tablet (10 mg total) by mouth daily.  90 tablet  3  . aspirin EC 325 MG tablet Take 1 tablet (325 mg total) by mouth daily.  100 tablet  3  . fluticasone (FLONASE) 50 MCG/ACT nasal spray as needed. 1 or 2 sprays each nostril twice a day      . hydrochlorothiazide (HYDRODIURIL) 25 MG tablet Take 1 tablet (25 mg total) by mouth daily.  90 tablet  3  . pentoxifylline (TRENTAL) 400 MG CR tablet Take 1 tablet (400 mg total) by mouth 3 (three) times daily with meals.  90 tablet  3  . Pitavastatin Calcium (LIVALO) 4 MG TABS One tab daily for cholesterol control.  30 tablet  5  . predniSONE (DELTASONE) 50 MG tablet Take 1 tablet (50 mg total) by mouth daily.  5 tablet  0  . ranitidine (ZANTAC) 150 MG tablet Take 1 tablet (150 mg total) by mouth 2 (two) times daily as needed for heartburn.  60 tablet  1  . tiotropium (SPIRIVA) 18 MCG inhalation capsule Place 18   mcg into inhaler and inhale daily.       No current facility-administered medications for this visit.    REVIEW OF SYSTEMS: [X ] denotes positive finding; [  ] denotes negative finding  CARDIOVASCULAR:  [ ] chest pain   [ ] chest pressure   [ ] palpitations   [ ] orthopnea   [ ] dyspnea on exertion   [X] claudication left leg  [ ] rest pain   [ ] DVT   [ ] phlebitis PULMONARY:   [ ] productive cough   [ ] asthma   [ ] wheezing NEUROLOGIC:   [ ] weakness  [ ] paresthesias  [ ] aphasia  [ ] amaurosis  [ ] dizziness HEMATOLOGIC:   [ ] bleeding problems   [ ] clotting disorders MUSCULOSKELETAL:  [ ] joint pain   [ ] joint swelling [ ] leg swelling GASTROINTESTINAL: [ ]  blood in  stool  [ ]  hematemesis GENITOURINARY:  [ ]  dysuria  [ ]  hematuria PSYCHIATRIC:  [ ] history of major depression INTEGUMENTARY:  [ ] rashes  [ ] ulcers CONSTITUTIONAL:  [ ] fever   [ ] chills  PHYSICAL EXAM: Filed Vitals:   12/04/12 0942  BP: 131/78  Pulse: 78  Resp: 16  Height: 5' 2" (1.575 m)  Weight: 112 lb (50.803 kg)  SpO2: 96%   Body mass index is 20.48 kg/(m^2). GENERAL: The patient is a well-nourished female, in no acute distress. The vital signs are documented above. CARDIOVASCULAR: There is a regular rate and rhythm. I do not detect carotid bruits. She has palpable radial pulses bilaterally. She has a palpable right femoral right popliteal and right dorsalis pedis pulse. I cannot palpate a posterior tibial pulse. On the left side I cannot palpate femoral, popliteal, dorsalis pedis, or posterior tibial pulse. Both feet appear adequately perfused. There is no significant lower extremity swelling. PULMONARY: There is good air exchange bilaterally without wheezing or rales. ABDOMEN: Soft and non-tender with normal pitched bowel sounds.  MUSCULOSKELETAL: There are no major deformities or cyanosis. NEUROLOGIC: No focal weakness or paresthesias are detected. SKIN: There are no ulcers or rashes noted. PSYCHIATRIC: The patient has a normal affect.  DATA:  I have reviewed her records from Dr. Hommel's office. She has well controlled blood pressure. Her Lipitor was stopped because of right upper quadrant pain, myalgias, and scleral discoloration. She was instructed on a structured walking program and has had minimal change in her symptoms since then. She is also being treated with Trental.  I have independently interpreted her arterial Doppler study in our office today. She has an ABI of 92% on the right and 57% on the left. On the right side she has a triphasic femoral signal and a biphasic posterior tibial signal with a triphasic dorsalis pedis signal. On the left side she has a  monophasic femoral, posterior tibial, and dorsalis pedis signal.   MEDICAL ISSUES:  Atherosclerosis of native arteries of the extremities with intermittent claudication Based on her exam and arterial Doppler study, this patient has evidence of iliac artery occlusive disease on the left. She could potentially have a left iliac artery occlusion or potentially significant stenosis. We have discussed the option of continuing with conservative treatment, that is a structured walking program and Trental. He feels her symptoms are significantly disabling and therefore we have discussed the option of proceeding with arteriography. If she has iliac stenosis amenable to angioplasty this could potentially   be addressed at the same time. If the iliac artery is occluded and she could be considered for a fem-fem bypass pending the results of her arteriogram.  I have reviewed with the patient the indications for arteriography. In addition, I have reviewed the potential complications of arteriography including but not limited to: Bleeding, arterial injury, arterial thrombosis, dye action, renal insufficiency, or other unpredictable medical problems. I have explained to the patient that if we find disease amenable to angioplasty we could potentially address this at the same time. I have discussed the potential complications of angioplasty and stenting, including but not limited to: Bleeding, arterial thrombosis, arterial injury, dissection, or the need for surgical intervention. Her arteriogram is scheduled for 12/09/2012. We will make further recommendations pending these results.    DICKSON,CHRISTOPHER S [facility] [place] Beeper: [phone]    

## 2012-12-04 NOTE — Assessment & Plan Note (Signed)
Based on her exam and arterial Doppler study, this patient has evidence of iliac artery occlusive disease on the left. She could potentially have a left iliac artery occlusion or potentially significant stenosis. We have discussed the option of continuing with conservative treatment, that is a structured walking program and Trental. He feels her symptoms are significantly disabling and therefore we have discussed the option of proceeding with arteriography. If she has iliac stenosis amenable to angioplasty this could potentially be addressed at the same time. If the iliac artery is occluded and she could be considered for a fem-fem bypass pending the results of her arteriogram.  I have reviewed with the patient the indications for arteriography. In addition, I have reviewed the potential complications of arteriography including but not limited to: Bleeding, arterial injury, arterial thrombosis, dye action, renal insufficiency, or other unpredictable medical problems. I have explained to the patient that if we find disease amenable to angioplasty we could potentially address this at the same time. I have discussed the potential complications of angioplasty and stenting, including but not limited to: Bleeding, arterial thrombosis, arterial injury, dissection, or the need for surgical intervention. Her arteriogram is scheduled for 12/09/2012. We will make further recommendations pending these results.

## 2012-12-09 ENCOUNTER — Other Ambulatory Visit: Payer: Self-pay

## 2012-12-09 ENCOUNTER — Encounter (HOSPITAL_COMMUNITY)
Admission: RE | Disposition: A | Discharge status: Home / Self Care | Payer: Self-pay | Source: Ambulatory Visit | Attending: Vascular Surgery

## 2012-12-09 ENCOUNTER — Telehealth: Payer: Self-pay | Admitting: Vascular Surgery

## 2012-12-09 ENCOUNTER — Ambulatory Visit (HOSPITAL_COMMUNITY)
Admission: RE | Admit: 2012-12-09 | Discharge: 2012-12-09 | Disposition: A | Discharge status: Home / Self Care | Payer: 59 | Source: Ambulatory Visit | Attending: Vascular Surgery | Admitting: Vascular Surgery

## 2012-12-09 DIAGNOSIS — Z87891 Personal history of nicotine dependence: Secondary | ICD-10-CM | POA: Insufficient documentation

## 2012-12-09 DIAGNOSIS — J438 Other emphysema: Secondary | ICD-10-CM | POA: Insufficient documentation

## 2012-12-09 DIAGNOSIS — Z885 Allergy status to narcotic agent status: Secondary | ICD-10-CM | POA: Insufficient documentation

## 2012-12-09 DIAGNOSIS — F411 Generalized anxiety disorder: Secondary | ICD-10-CM | POA: Insufficient documentation

## 2012-12-09 DIAGNOSIS — Z883 Allergy status to other anti-infective agents status: Secondary | ICD-10-CM | POA: Insufficient documentation

## 2012-12-09 DIAGNOSIS — I70219 Atherosclerosis of native arteries of extremities with intermittent claudication, unspecified extremity: Secondary | ICD-10-CM

## 2012-12-09 DIAGNOSIS — I10 Essential (primary) hypertension: Secondary | ICD-10-CM | POA: Insufficient documentation

## 2012-12-09 DIAGNOSIS — Z79899 Other long term (current) drug therapy: Secondary | ICD-10-CM | POA: Insufficient documentation

## 2012-12-09 DIAGNOSIS — I739 Peripheral vascular disease, unspecified: Secondary | ICD-10-CM

## 2012-12-09 DIAGNOSIS — Z7982 Long term (current) use of aspirin: Secondary | ICD-10-CM | POA: Insufficient documentation

## 2012-12-09 HISTORY — PX: ABDOMINAL AORTAGRAM: SHX5454

## 2012-12-09 HISTORY — PX: ABDOMINAL AORTAGRAM: SHX5706

## 2012-12-09 LAB — POCT I-STAT, CHEM 8
BUN: 22 mg/dL (ref 6–23)
Sodium: 141 mEq/L (ref 135–145)
TCO2: 29 mmol/L (ref 0–100)

## 2012-12-09 SURGERY — ABDOMINAL AORTAGRAM
Anesthesia: LOCAL

## 2012-12-09 MED ORDER — ACETAMINOPHEN 325 MG PO TABS: 650.0000 mg | ORAL_TABLET | ORAL | Status: DC | PRN

## 2012-12-09 MED ORDER — HEPARIN (PORCINE) IN NACL 2-0.9 UNIT/ML-% IJ SOLN
INTRAMUSCULAR | Status: AC
  Filled 2012-12-09: qty 1000

## 2012-12-09 MED ORDER — LIDOCAINE HCL (PF) 1 % IJ SOLN
INTRAMUSCULAR | Status: AC
  Filled 2012-12-09: qty 30

## 2012-12-09 MED ORDER — SODIUM CHLORIDE 0.9 % IV SOLN: 1.0000 mL/kg/h | INTRAVENOUS | Status: DC

## 2012-12-09 MED ORDER — FENTANYL CITRATE 0.05 MG/ML IJ SOLN
INTRAMUSCULAR | Status: AC
  Filled 2012-12-09: qty 2

## 2012-12-09 MED ORDER — ONDANSETRON HCL 4 MG/2ML IJ SOLN: 4.0000 mg | Freq: Four times a day (QID) | INTRAMUSCULAR | Status: DC | PRN

## 2012-12-09 MED ORDER — SODIUM CHLORIDE 0.9 % IV SOLN
INTRAVENOUS | Status: DC
  Administered 2012-12-09: 06:00:00 via INTRAVENOUS

## 2012-12-09 MED ORDER — MIDAZOLAM HCL 2 MG/2ML IJ SOLN
INTRAMUSCULAR | Status: AC
  Filled 2012-12-09: qty 2

## 2012-12-09 NOTE — Telephone Encounter (Addendum)
Message copied by Rosalyn Charters on Mon Dec 09, 2012  9:33 AM ------      Message from: Phillips Odor      Created: Mon Dec 09, 2012  9:16 AM      Regarding: FW: F/U                   ----- Message -----         From: Chuck Hint, MD         Sent: 12/09/2012   8:22 AM           To: Conley Simmonds Pullins, RN      Subject: F/U                                                      She needs a follow visit in 6 months with ABIs. Thank you. CD ------  Notified patient of fu appt. With dr. Edilia Bo on 01-22-13 3 pm

## 2012-12-09 NOTE — H&P (View-Only) (Signed)
[facility] [place]  Patient name: Catherine Phillips MRN: [id number] DOB: 1946/03/01 Sex: female  REASON FOR CONSULT: left lower extremity claudication referred by Dr. Laren Boom  HPI: Catherine Phillips is a 67 y.o. female who noted the gradual onset of left lower extremity claudication at proximally one year ago. Initially her symptoms were in the left calf. She now experiences symptoms in the calf and left thigh. Her symptoms occur approximately one block. They are brought on by ambulation and relieved with rest. They have been gradually progressive. She denies any history of rest pain or history of nonhealing ulcers. There are no other aggravating or alleviating factors.  She does have a history of mildly elevated cholesterol and is currently off of Lipitor as this caused nausea. In addition she has hypertension which is been well controlled. She denies any history of diabetes, history of previous myocardial infarction or history of congestive heart failure.  Past Medical History  Diagnosis Date  . COPD (chronic obstructive pulmonary disease)   . Hypertension   . Emphysema of lung   . Anxiety 06/24/2012  . Essential hypertension 06/24/2012  . Emphysema 06/24/2012  . History of Left leg claudication 06/24/2012    Family History  Problem Relation Age of Onset  . Cancer Mother   . Heart attack Father   . Deep vein thrombosis Father   . Hyperlipidemia Father   . Hypertension Father    She is unaware of any history of premature cardiovascular disease.  SOCIAL HISTORY: History  Substance Use Topics  . Smoking status: Former Smoker -- 1.50 packs/day for 30 years    Types: Cigarettes    Quit date: 12/30/2009  . Smokeless tobacco: Never Used  . Alcohol Use: No    Allergies  Allergen Reactions  . Codeine Nausea And Vomiting  . Levaquin (Levofloxacin In D5w) Itching and Rash    Current Outpatient Prescriptions  Medication Sig Dispense Refill  . albuterol (ACCUNEB)  1.25 MG/3ML nebulizer solution Take 1 ampule by nebulization every 6 (six) hours as needed.      [name] alendronate (FOSAMAX) 70 MG tablet Take 1 tablet (70 mg total) by mouth every 7 (seven) days. Take with a full glass of water on an empty stomach.  12 tablet  2  . ALPRAZolam (XANAX) 0.5 MG tablet Take 0.5 mg by mouth as needed.      [name] amLODipine (NORVASC) 10 MG tablet Take 1 tablet (10 mg total) by mouth daily.  90 tablet  3  . aspirin EC 325 MG tablet Take 1 tablet (325 mg total) by mouth daily.  100 tablet  3  . fluticasone (FLONASE) 50 MCG/ACT nasal spray as needed. 1 or 2 sprays each nostril twice a day      . hydrochlorothiazide (HYDRODIURIL) 25 MG tablet Take 1 tablet (25 mg total) by mouth daily.  90 tablet  3  . pentoxifylline (TRENTAL) 400 MG CR tablet Take 1 tablet (400 mg total) by mouth 3 (three) times daily with meals.  90 tablet  3  . Pitavastatin Calcium (LIVALO) 4 MG TABS One tab daily for cholesterol control.  30 tablet  5  . predniSONE (DELTASONE) 50 MG tablet Take 1 tablet (50 mg total) by mouth daily.  5 tablet  0  . ranitidine (ZANTAC) 150 MG tablet Take 1 tablet (150 mg total) by mouth 2 (two) times daily as needed for heartburn.  60 tablet  1  . tiotropium (SPIRIVA) 18 MCG inhalation capsule Place 18  mcg into inhaler and inhale daily.       No current facility-administered medications for this visit.    REVIEW OF SYSTEMS: Arly.Keller ] denotes positive finding; [  ] denotes negative finding  CARDIOVASCULAR:  [ ]  chest pain   [ ]  chest pressure   [ ]  palpitations   [ ]  orthopnea   [ ]  dyspnea on exertion   [X]  claudication left leg  [ ]  rest pain   [ ]  DVT   [ ]  phlebitis PULMONARY:   [ ]  productive cough   [ ]  asthma   [ ]  wheezing NEUROLOGIC:   [ ]  weakness  [ ]  paresthesias  [ ]  aphasia  [ ]  amaurosis  [ ]  dizziness HEMATOLOGIC:   [ ]  bleeding problems   [ ]  clotting disorders MUSCULOSKELETAL:  [ ]  joint pain   [ ]  joint swelling [ ]  leg swelling GASTROINTESTINAL: [ ]   blood in  stool  [ ]   hematemesis GENITOURINARY:  [ ]   dysuria  [ ]   hematuria PSYCHIATRIC:  [ ]  history of major depression INTEGUMENTARY:  [ ]  rashes  [ ]  ulcers CONSTITUTIONAL:  [ ]  fever   [ ]  chills  PHYSICAL EXAM: Filed Vitals:   12/04/12 0942  BP: 131/78  Pulse: 78  Resp: 16  Height: 5\' 2"  (1.575 m)  Weight: 112 lb (50.803 kg)  SpO2: 96%   Body mass index is 20.48 kg/(m^2). GENERAL: The patient is a well-nourished female, in no acute distress. The vital signs are documented above. CARDIOVASCULAR: There is a regular rate and rhythm. I do not detect carotid bruits. She has palpable radial pulses bilaterally. She has a palpable right femoral right popliteal and right dorsalis pedis pulse. I cannot palpate a posterior tibial pulse. On the left side I cannot palpate femoral, popliteal, dorsalis pedis, or posterior tibial pulse. Both feet appear adequately perfused. There is no significant lower extremity swelling. PULMONARY: There is good air exchange bilaterally without wheezing or rales. ABDOMEN: Soft and non-tender with normal pitched bowel sounds.  MUSCULOSKELETAL: There are no major deformities or cyanosis. NEUROLOGIC: No focal weakness or paresthesias are detected. SKIN: There are no ulcers or rashes noted. PSYCHIATRIC: The patient has a normal affect.  DATA:  I have reviewed her records from Dr. Genelle Bal office. She has well controlled blood pressure. Her Lipitor was stopped because of right upper quadrant pain, myalgias, and scleral discoloration. She was instructed on a structured walking program and has had minimal change in her symptoms since then. She is also being treated with Trental.  I have independently interpreted her arterial Doppler study in our office today. She has an ABI of 92% on the right and 57% on the left. On the right side she has a triphasic femoral signal and a biphasic posterior tibial signal with a triphasic dorsalis pedis signal. On the left side she has a  monophasic femoral, posterior tibial, and dorsalis pedis signal.   MEDICAL ISSUES:  Atherosclerosis of native arteries of the extremities with intermittent claudication Based on her exam and arterial Doppler study, this patient has evidence of iliac artery occlusive disease on the left. She could potentially have a left iliac artery occlusion or potentially significant stenosis. We have discussed the option of continuing with conservative treatment, that is a structured walking program and Trental. He feels her symptoms are significantly disabling and therefore we have discussed the option of proceeding with arteriography. If she has iliac stenosis amenable to angioplasty this could potentially  be addressed at the same time. If the iliac artery is occluded and she could be considered for a fem-fem bypass pending the results of her arteriogram.  I have reviewed with the patient the indications for arteriography. In addition, I have reviewed the potential complications of arteriography including but not limited to: Bleeding, arterial injury, arterial thrombosis, dye action, renal insufficiency, or other unpredictable medical problems. I have explained to the patient that if we find disease amenable to angioplasty we could potentially address this at the same time. I have discussed the potential complications of angioplasty and stenting, including but not limited to: Bleeding, arterial thrombosis, arterial injury, dissection, or the need for surgical intervention. Her arteriogram is scheduled for 12/09/2012. We will make further recommendations pending these results.    DICKSON,CHRISTOPHER S [facility] [place] Beeper: [phone]

## 2012-12-09 NOTE — Interval H&P Note (Signed)
History and Physical Interval Note:  12/09/2012 7:24 AM  Catherine Phillips  has presented today for surgery, with the diagnosis of pvd/aiod  The various methods of treatment have been discussed with the patient and family. After consideration of risks, benefits and other options for treatment, the patient has consented to  Procedure(s): ABDOMINAL AORTAGRAM (N/A) as a surgical intervention .  The patient's history has been reviewed, patient examined, no change in status, stable for surgery.  I have reviewed the patient's chart and labs.  Questions were answered to the patient's satisfaction.     DICKSON,CHRISTOPHER S

## 2012-12-09 NOTE — Op Note (Signed)
PATIENT: Catherine Phillips   MRN: [id number] DOB: 05-Jul-1946    DATE OF PROCEDURE: 12/09/2012  INDICATIONS: Victoriya Pol is a 67 y.o. female presented with left lower extremity claudication. She had evidence of left iliac artery occlusive disease. She is brought in for diagnostic arteriography and possible angioplasty.  PROCEDURE:  1. Ultrasound-guided access the right common femoral artery 2. Aortogram with bilateral iliac arteriogram and bilateral lower extremity runoff  SURGEON: Di Kindle. Edilia Bo, MD, FACS  ANESTHESIA: local with sedation   EBL: minimal  TECHNIQUE: The patient was brought to the peripheral vascular lab and sedated with 1 mg of Versed and 25 mcg of fentanyl. Both groins were prepped and draped in the usual sterile fashion. Under ultrasound guidance, and after the skin was anesthetized, the right common femoral artery was cannulated and a guidewire introduced into the infrarenal aorta under fluoroscopic control. A 5 French sheath was introduced over the wire. Pigtail catheter was positioned at the L1 vertebral body. Flush aortogram was obtained. The catheter was positioned at the aortic bifurcation and oblique iliac projections were obtained. Next bilateral lower extremity runoff was obtained.  FINDINGS:  1. Single renal arteries bilaterally with no significant renal artery stenosis. 2. Patent infrarenal aorta, right common iliac artery, right hypogastric artery, and right external iliac artery. 3. Patent left common iliac artery and hypogastric artery. The left external iliac artery is occluded at its origin with reconstitution of the common femoral artery just above the inguinal ligament. 4. On the right side, the common femoral, superficial femoral, deep femoral, popliteal, anterior tibial, tibial peroneal trunk, posterior tibial, and peroneal arteries are patent. 5. Likewise, on the left side there is no significant infrainguinal arterial occlusive disease. The left common  femoral, superficial femoral, deep femoral, popliteal, anterior tibial, tibial peroneal trunk, posterior tibial artery, and peroneal artery are patent.  Waverly Ferrari, MD, FACS [facility]  DATE OF DICTATION:   12/09/2012

## 2013-01-21 ENCOUNTER — Encounter: Payer: Self-pay | Admitting: Vascular Surgery

## 2013-01-22 ENCOUNTER — Ambulatory Visit (INDEPENDENT_AMBULATORY_CARE_PROVIDER_SITE_OTHER): Payer: 59 | Admitting: Vascular Surgery

## 2013-01-22 ENCOUNTER — Encounter (INDEPENDENT_AMBULATORY_CARE_PROVIDER_SITE_OTHER): Payer: Managed Care, Other (non HMO) | Admitting: *Deleted

## 2013-01-22 ENCOUNTER — Encounter: Payer: Self-pay | Admitting: Vascular Surgery

## 2013-01-22 ENCOUNTER — Other Ambulatory Visit: Payer: Self-pay | Admitting: *Deleted

## 2013-01-22 VITALS — BP 139/80 | HR 81 | Resp 16 | Ht 62.0 in | Wt 113.0 lb

## 2013-01-22 DIAGNOSIS — M79609 Pain in unspecified limb: Secondary | ICD-10-CM

## 2013-01-22 DIAGNOSIS — I739 Peripheral vascular disease, unspecified: Secondary | ICD-10-CM

## 2013-01-22 DIAGNOSIS — I70219 Atherosclerosis of native arteries of extremities with intermittent claudication, unspecified extremity: Secondary | ICD-10-CM

## 2013-01-22 NOTE — Progress Notes (Signed)
[facility] [place]  Patient name: Catherine Phillips MRN: [id number] DOB: August 11, 1946 Sex: female  REASON FOR VISIT: follow up after aortogram.  HPI: Catherine Phillips is a 67 y.o. female who had presented with left lower extremity claudication. She had evidence of left iliac artery occlusive disease. She underwent an arteriogram on 12/09/2012. This showed a chronic left external iliac artery occlusion. She had some mild infrainguinal arterial occlusive disease on the left.  Since her procedure, she states that she's been walking more in her symptoms are actually improved. She denies any history of rest pain or history of nonhealing ulcers.  REVIEW OF SYSTEMS: Arly.Keller ] denotes positive finding; [  ] denotes negative finding  CARDIOVASCULAR:  [ ]  chest pain   [ ]  dyspnea on exertion    CONSTITUTIONAL:  [ ]  fever   [ ]  chills  PHYSICAL EXAM: Filed Vitals:   01/22/13 1549  BP: 139/80  Pulse: 81  Resp: 16  Height: 5\' 2"  (1.575 m)  Weight: 113 lb (51.256 kg)  SpO2: 95%   Body mass index is 20.66 kg/(m^2). GENERAL: The patient is a well-nourished female, in no acute distress. The vital signs are documented above. CARDIOVASCULAR: There is a regular rate and rhythm. Has a palpable right femoral pulse. I cannot palpate a left femoral pulse. The feet are warm and well-perfused. PULMONARY: There is good air exchange bilaterally without wheezing or rales. There is no hematoma in the right groin.  MEDICAL ISSUES:  Atherosclerosis of native arteries of the extremities with intermittent claudication This patient has a chronic left external iliac artery occlusion. Her symptoms are quite tolerable and she is not a smoker currently. She seems very motivated to get on a structured walking program and has been walking quite a bit lately. Her symptoms are improving. This reason, I would not recommend revascularization unless her symptoms progressed significantly. She would potentially be a  candidate for a right to left fem-fem bypass graft if necessary. I plan on seeing her back in one year. I've ordered follow up ABIs for that time. She knows to call sooner if she has problems.   DICKSON,CHRISTOPHER S [facility] [place] Beeper: [phone]

## 2013-01-22 NOTE — Assessment & Plan Note (Signed)
This patient has a chronic left external iliac artery occlusion. Her symptoms are quite tolerable and she is not a smoker currently. She seems very motivated to get on a structured walking program and has been walking quite a bit lately. Her symptoms are improving. This reason, I would not recommend revascularization unless her symptoms progressed significantly. She would potentially be a candidate for a right to left fem-fem bypass graft if necessary. I plan on seeing her back in one year. I've ordered follow up ABIs for that time. She knows to call sooner if she has problems.

## 2013-02-12 ENCOUNTER — Encounter: Payer: Self-pay | Admitting: Family Medicine

## 2013-02-12 ENCOUNTER — Ambulatory Visit (INDEPENDENT_AMBULATORY_CARE_PROVIDER_SITE_OTHER): Payer: 59 | Admitting: Family Medicine

## 2013-02-12 VITALS — BP 137/73 | HR 79 | Temp 98.3°F | Wt 112.0 lb

## 2013-02-12 DIAGNOSIS — R1013 Epigastric pain: Secondary | ICD-10-CM

## 2013-02-12 DIAGNOSIS — E785 Hyperlipidemia, unspecified: Secondary | ICD-10-CM

## 2013-02-12 DIAGNOSIS — K219 Gastro-esophageal reflux disease without esophagitis: Secondary | ICD-10-CM

## 2013-02-12 LAB — CBC
Platelets: 422 10*3/uL — ABNORMAL HIGH (ref 150–400)
RBC: 4.85 MIL/uL (ref 3.87–5.11)
RDW: 13.8 % (ref 11.5–15.5)
WBC: 12.8 10*3/uL — ABNORMAL HIGH (ref 4.0–10.5)

## 2013-02-12 LAB — LIPID PANEL
HDL: 54 mg/dL (ref >39)
LDL Cholesterol: 87 mg/dL (ref 0–99)
Total CHOL/HDL Ratio: 3.3 Ratio
Triglycerides: 185 mg/dL — ABNORMAL HIGH (ref <150)
VLDL: 37 mg/dL (ref 0–40)

## 2013-02-12 LAB — COMPLETE METABOLIC PANEL WITH GFR
ALT: 10 U/L (ref 0–35)
AST: 17 U/L (ref 0–37)
Albumin: 4.7 g/dL (ref 3.5–5.2)
CO2: 27 mEq/L (ref 19–32)
Calcium: 10 mg/dL (ref 8.4–10.5)
Chloride: 104 mEq/L (ref 96–112)
GFR, Est African American: 63 mL/min
Potassium: 4.5 mEq/L (ref 3.5–5.3)
Total Protein: 7.1 g/dL (ref 6.0–8.3)

## 2013-02-12 MED ORDER — PANTOPRAZOLE SODIUM 40 MG PO TBEC: 40.0000 mg | DELAYED_RELEASE_TABLET | Freq: Every day | ORAL | Status: DC

## 2013-02-12 MED ORDER — TRAMADOL HCL 50 MG PO TABS: 50.0000 mg | ORAL_TABLET | Freq: Four times a day (QID) | ORAL | Status: DC | PRN

## 2013-02-12 NOTE — Progress Notes (Signed)
CC: Catherine Phillips is a 67 y.o. female is here for Abdominal Pain   Subjective: HPI:  Patient complains of epigastric pain is been present on a daily basis for 2 weeks. It is moderate in severity after eating food improves with an empty stomach. Radiates into the back wrapping around the right side. Occurs with every single meal despite changing her diet. Significantly improves with TUMS, she's unsure whether or not ranitidine is helping. Describes the pain as a burning. Other than above nothing makes better or worse. She has a history of gastric ulcers and does admit to frequent use of nonsteroidal anti-inflammatories for limb claudication.  Denies nausea, vomiting, fevers, chills, rapid heartbeat, chest pain, shortness of breath, tar-like stool, blood in stool, unintentional weight loss, diarrhea, constipation.    Review Of Systems Outlined In HPI  Past Medical History  Diagnosis Date  . COPD (chronic obstructive pulmonary disease)   . Hypertension   . Emphysema of lung   . Anxiety 06/24/2012  . Essential hypertension 06/24/2012  . Emphysema 06/24/2012  . History of Left leg claudication 06/24/2012     Family History  Problem Relation Age of Onset  . Cancer Mother   . Heart attack Father   . Deep vein thrombosis Father   . Hyperlipidemia Father   . Hypertension Father      History  Substance Use Topics  . Smoking status: Former Smoker -- 1.50 packs/day for 30 years    Types: Cigarettes    Quit date: 12/30/2009  . Smokeless tobacco: Never Used  . Alcohol Use: No     Objective: Filed Vitals:   02/12/13 0820  BP: 137/73  Pulse: 79  Temp: 98.3 F (36.8 C)    General: Alert and Oriented, No Acute Distress HEENT: Pupils equal, round, reactive to light. Conjunctivae clear.  Moist mucous membranes Lungs: Clear to auscultation bilaterally, no wheezing/ronchi/rales.  Comfortable work of breathing. Good air movement. Cardiac: Regular rate and rhythm. Normal S1/S2.  No murmurs,  rubs, nor gallops.   Abdomen: Normal bowel sounds, soft without palpable masses, negative Murphy's, pain is reproduced with epigastric palpation no pain with palpation of remainder of abdomen Extremities: No peripheral edema.  Strong peripheral pulses.  Mental Status: No depression, anxiety, nor agitation. Skin: Warm and dry.  Assessment & Plan: Catherine Phillips was seen today for abdominal pain.  Diagnoses and associated orders for this visit:  Hyperlipidemia LDL goal <70 - Lipid panel  GERD (gastroesophageal reflux disease) - pantoprazole (PROTONIX) 40 MG tablet; Take 1 tablet (40 mg total) by mouth daily.  Abdominal pain, epigastric - CBC - COMPLETE METABOLIC PANEL WITH GFR - Lipase - Gamma GT - H. pylori antibody, IgG  Other Orders - traMADol (ULTRAM) 50 MG tablet; Take 1 tablet (50 mg total) by mouth every 6 (six) hours as needed for pain.    Concerned that her abdominal pain may be due to gastric ulcer versus pancreatitis versus hepatitis versus cholelithiasis, we'll screen for both conditions with above labs. Start daily PPI. Further management will be dictated based on above labs, she was given stool card to screen for bleeding gastric ulcer. Stop nonsteroidal anti-inflammatory use and substitute tramadol She's a little over 2 months out from switching statins to livalo therefore repeat LDL  Return in about 2 weeks (around 02/26/2013).

## 2013-02-20 ENCOUNTER — Telehealth: Payer: Self-pay | Admitting: *Deleted

## 2013-02-20 DIAGNOSIS — K219 Gastro-esophageal reflux disease without esophagitis: Secondary | ICD-10-CM

## 2013-02-20 MED ORDER — ESOMEPRAZOLE MAGNESIUM 40 MG PO CPDR: 40.0000 mg | DELAYED_RELEASE_CAPSULE | Freq: Every day | ORAL | Status: DC

## 2013-02-20 NOTE — Telephone Encounter (Signed)
Pt calls today & wanted to let you know that she is out of the nexuim samples you gave her & would like for you to call in rx.

## 2013-02-20 NOTE — Telephone Encounter (Signed)
Sue Lush or Abby Potash, Will you please let Mrs. Yarbough know that I also sent an Rx for pantoprazole at the time of her last visit that works just like nexium.  If she's already filled the pantoprazole, she only needs to take that, however if nexium is cheaper she can fill that script sent today, she should only take one or the other.

## 2013-02-20 NOTE — Telephone Encounter (Signed)
Pt.notified

## 2013-02-24 ENCOUNTER — Telehealth: Payer: Self-pay | Admitting: *Deleted

## 2013-02-24 DIAGNOSIS — K219 Gastro-esophageal reflux disease without esophagitis: Secondary | ICD-10-CM

## 2013-02-24 NOTE — Telephone Encounter (Signed)
Pt calls and states that the Nexium cost too much 75.00.  She wants to know if you want to change it or have any coupon cards to decrease the cost. Barry Dienes, LPN

## 2013-02-24 NOTE — Telephone Encounter (Signed)
Patient advised.

## 2013-02-24 NOTE — Telephone Encounter (Signed)
It looks like we got some savings cards over the weekend, I'll place it along with some samples at the front desk for her to pickup.  If for some reason it is still too expensive, ask the pharmacist if there is anything cheaper on her formulary, something that our offic edoesn't have access to.

## 2013-03-12 ENCOUNTER — Telehealth: Payer: Self-pay | Admitting: Emergency Medicine

## 2013-03-12 MED ORDER — PITAVASTATIN CALCIUM 4 MG PO TABS: 1.0000 | ORAL_TABLET | Freq: Every day | ORAL | Status: DC

## 2013-04-07 ENCOUNTER — Other Ambulatory Visit: Payer: Self-pay | Admitting: *Deleted

## 2013-04-07 ENCOUNTER — Other Ambulatory Visit: Payer: Self-pay | Admitting: Family Medicine

## 2013-04-07 MED ORDER — PANTOPRAZOLE SODIUM 20 MG PO TBEC: DELAYED_RELEASE_TABLET | ORAL | Status: DC

## 2013-05-21 ENCOUNTER — Encounter: Payer: Self-pay | Admitting: Family Medicine

## 2013-05-21 ENCOUNTER — Ambulatory Visit (INDEPENDENT_AMBULATORY_CARE_PROVIDER_SITE_OTHER): Payer: 59 | Admitting: Family Medicine

## 2013-05-21 ENCOUNTER — Telehealth: Payer: Self-pay | Admitting: *Deleted

## 2013-05-21 VITALS — BP 142/82 | HR 96 | Wt 110.0 lb

## 2013-05-21 DIAGNOSIS — M79671 Pain in right foot: Secondary | ICD-10-CM

## 2013-05-21 DIAGNOSIS — M79609 Pain in unspecified limb: Secondary | ICD-10-CM

## 2013-05-21 DIAGNOSIS — R208 Other disturbances of skin sensation: Secondary | ICD-10-CM

## 2013-05-21 DIAGNOSIS — M79673 Pain in unspecified foot: Secondary | ICD-10-CM

## 2013-05-21 DIAGNOSIS — R209 Unspecified disturbances of skin sensation: Secondary | ICD-10-CM

## 2013-05-21 LAB — COMPLETE METABOLIC PANEL WITH GFR
ALT: 10 U/L (ref 0–35)
Alkaline Phosphatase: 45 U/L (ref 39–117)
CO2: 21 mEq/L (ref 19–32)
Creat: 1.21 mg/dL — ABNORMAL HIGH (ref 0.50–1.10)
GFR, Est African American: 54 mL/min — ABNORMAL LOW
Total Bilirubin: 0.4 mg/dL (ref 0.3–1.2)

## 2013-05-21 MED ORDER — AMITRIPTYLINE HCL 25 MG PO TABS: 25.0000 mg | ORAL_TABLET | Freq: Every day | ORAL | Status: DC

## 2013-05-21 MED ORDER — HYDROCODONE-ACETAMINOPHEN 5-325 MG PO TABS: 1.0000 | ORAL_TABLET | Freq: Three times a day (TID) | ORAL | Status: DC | PRN

## 2013-05-21 NOTE — Progress Notes (Addendum)
CC: Catherine Phillips is a 67 y.o. female is here for feet and hands hurt   Subjective: HPI:  Patient complains of severe burning affecting both feet has been present for 3 days slowly worsening seems to be worse with rest, it has woken her from her sleep the past 2 nights. No interventions as of yet nothing particularly makes it better it affects both dorsal and plantar side of feet symmetrically. She's never had this before. She's been on her feet much more now working at [employer] no other changes to lifestyle or medications. She denies skin changes focal joint pain nor motor or sensory disturbances in the extremities other than that described above. Denies joint swelling redness nor warmth overall denies skin changes and extremities. Denies fevers, chills, rapid heartbeat, flushing, nausea, back pain, saddle paresthesia or bowel bladder incontinence   Review Of Systems Outlined In HPI  Past Medical History  Diagnosis Date  . COPD (chronic obstructive pulmonary disease)   . Hypertension   . Emphysema of lung   . Anxiety 06/24/2012  . Essential hypertension 06/24/2012  . Emphysema 06/24/2012  . History of Left leg claudication 06/24/2012     Family History  Problem Relation Age of Onset  . Cancer Mother   . Heart attack Father   . Deep vein thrombosis Father   . Hyperlipidemia Father   . Hypertension Father      History  Substance Use Topics  . Smoking status: Former Smoker -- 1.50 packs/day for 30 years    Types: Cigarettes    Quit date: 12/30/2009  . Smokeless tobacco: Never Used  . Alcohol Use: No     Objective: Filed Vitals:   05/21/13 0820  BP: 142/82  Pulse: 96    General: Alert and Oriented, No Acute Distress HEENT: Pupils equal, round, reactive to light. Conjunctivae clear. Moist mucous membranes Cardiac: Regular rate and rhythm. Normal S1/S2.  No murmurs, rubs, nor gallops.   Extremities: No peripheral edema.  Strong peripheral pulses. Full range of motion and  strength in both lower extremities.  Monofilament sensation is absent bilaterally vibratory sensation is intact in both feet. L4 and S1 DTRs two over four bilaterally.  Mental Status: No depression, anxiety, nor agitation. Skin: Warm and dry without gross skin abnormalities on both feet  Assessment & Plan: Catherine Phillips was seen today for feet and hands hurt.  Diagnoses and associated orders for this visit:  Foot pain, bilateral - COMPLETE METABOLIC PANEL WITH GFR - B12 - amitriptyline (ELAVIL) 25 MG tablet; Take 1 tablet (25 mg total) by mouth at bedtime.  Burning sensation of feet - COMPLETE METABOLIC PANEL WITH GFR - B12 - amitriptyline (ELAVIL) 25 MG tablet; Take 1 tablet (25 mg total) by mouth at bedtime.    Foot pain: Will look into B12 deficiency and/or metabolic deficiency above start amitriptyline we'll refer to nerve conduction studies if not improving after one-2 weeks or worsening  Return if symptoms worsen or fail to improve.

## 2013-05-21 NOTE — Telephone Encounter (Signed)
Pt states she wants you to call her in something different for pain. States she took 1 of the Amitriptyline this am and says it has not helped and she is at work. She also says that she seen that it is for depression. I informed pt that it can also be used for other things. Please advise.  Meyer Cory, LPN

## 2013-05-21 NOTE — Telephone Encounter (Signed)
Left message on vm and faxed rx

## 2013-05-21 NOTE — Telephone Encounter (Signed)
Catherine Phillips, Will you please let [name] know she can try hydrocodone-acetaminophen, rx in your inbox ready for faxing or pickup.  I would still encourage her to take the amitriptyline on a daily basis, this medication was initially designed for depression but it actually does a lousy job with that but is well known for neuropathic pain like she's experiencing.  It will take up to a week before she starts noticing a big difference.

## 2013-05-26 ENCOUNTER — Telehealth: Payer: Self-pay | Admitting: *Deleted

## 2013-05-26 DIAGNOSIS — R202 Paresthesia of skin: Secondary | ICD-10-CM

## 2013-05-26 DIAGNOSIS — R208 Other disturbances of skin sensation: Secondary | ICD-10-CM

## 2013-05-26 DIAGNOSIS — G629 Polyneuropathy, unspecified: Secondary | ICD-10-CM

## 2013-05-26 MED ORDER — PREGABALIN 50 MG PO CAPS: 50.0000 mg | ORAL_CAPSULE | Freq: Three times a day (TID) | ORAL | Status: DC

## 2013-05-26 NOTE — Telephone Encounter (Signed)
Pt notified;samples up front

## 2013-05-26 NOTE — Telephone Encounter (Signed)
Pt states that the amitripityline you put her on is not helping. She states her feet are still burning and hurting. She also has sore throat and feels like she is coming down with a cold. Pt states when she came home from work last night she rubbed her feet in peroxide and states that around her toes that it foamed up and is concerned she has an infection. Please advise.  Meyer Cory, LPN

## 2013-05-26 NOTE — Telephone Encounter (Signed)
Sue Lush, Will you please let Carie know that she may as well stop the amitriptyline since it's not helping.  Lyrica is another option for her nerve pain, I'll set aside some samples for her if she'd like to give it a try.  I'm still suspicious that her pain is from a neuropathy of the small nerve fibers in her foot.  This can be further investigated with a nerve conduction test which I've placed a referral for.  Peroxide would cause bubbling even if she does not have an infection since our toes have so much bacteria living on the skin.  If she notices any worsening redness or swelling that would certainly be suspicious for an infection.

## 2013-06-04 ENCOUNTER — Other Ambulatory Visit: Payer: Self-pay | Admitting: *Deleted

## 2013-06-04 ENCOUNTER — Telehealth: Payer: Self-pay | Admitting: *Deleted

## 2013-06-04 DIAGNOSIS — R202 Paresthesia of skin: Secondary | ICD-10-CM

## 2013-06-04 DIAGNOSIS — R208 Other disturbances of skin sensation: Secondary | ICD-10-CM

## 2013-06-04 MED ORDER — GABAPENTIN 100 MG PO CAPS: ORAL_CAPSULE | ORAL | Status: DC

## 2013-06-04 NOTE — Telephone Encounter (Signed)
Pt notified. i realeased order and per dr. Ivan Anchors. She has not but I called [facility] and they refaxed order and demo,insurance cards to [phone] and they will fax [place] appt time and date after they notify patient

## 2013-06-04 NOTE — Telephone Encounter (Signed)
Pt has been on lyrica for the past week. Pt states she felt lightheaded and dizzy. She did actually fall once. Pt was wondering if she could try the gabapentin for her neuropathy instead of lyrica. Pt states the lyrica did help but she didn't like the SE. She is almost out of medicine and wanted to know if this gabapentin would be an option. Also she states the gabapentin is less expensive for her

## 2013-06-04 NOTE — Telephone Encounter (Signed)
Yes, Gabapentin is in the same family as lyrica, and Rx has been sent to [facility] on [address].  Has she heard any news from [facility] about a nerve conduction study?

## 2013-06-09 ENCOUNTER — Telehealth: Payer: Self-pay | Admitting: *Deleted

## 2013-06-09 MED ORDER — PREGABALIN 50 MG PO CAPS: 50.0000 mg | ORAL_CAPSULE | Freq: Three times a day (TID) | ORAL | Status: DC

## 2013-06-09 NOTE — Telephone Encounter (Signed)
Andrea, Rx placed in in-box ready for pickup/faxing.  

## 2013-06-09 NOTE — Telephone Encounter (Signed)
rx faxed, spouse notified

## 2013-06-09 NOTE — Telephone Encounter (Signed)
Pt called and left a message stating she could not take the gabapentin. She would like a r x for the lyrica

## 2013-06-26 ENCOUNTER — Ambulatory Visit (INDEPENDENT_AMBULATORY_CARE_PROVIDER_SITE_OTHER): Payer: 59 | Admitting: Sports Medicine

## 2013-06-26 DIAGNOSIS — Z23 Encounter for immunization: Secondary | ICD-10-CM

## 2013-06-26 NOTE — Progress Notes (Signed)
Influenza vaccine given. I was present for all essential parts of this visit and procedure. Ihor Austin. Benjamin Stain, M.D.

## 2013-06-26 NOTE — Addendum Note (Signed)
Addended by: Monica Becton on: 06/26/2013 05:48 PM   Modules accepted: Level of Service

## 2013-07-01 ENCOUNTER — Telehealth: Payer: Self-pay | Admitting: Family Medicine

## 2013-07-01 NOTE — Telephone Encounter (Signed)
EMG complete at [facility] waiting on transcript

## 2013-07-03 ENCOUNTER — Telehealth: Payer: Self-pay | Admitting: *Deleted

## 2013-07-03 ENCOUNTER — Ambulatory Visit (INDEPENDENT_AMBULATORY_CARE_PROVIDER_SITE_OTHER): Payer: 59 | Admitting: Family Medicine

## 2013-07-03 ENCOUNTER — Encounter: Payer: Self-pay | Admitting: Family Medicine

## 2013-07-03 VITALS — BP 138/70 | HR 73 | Wt 112.0 lb

## 2013-07-03 DIAGNOSIS — R609 Edema, unspecified: Secondary | ICD-10-CM

## 2013-07-03 DIAGNOSIS — R7989 Other specified abnormal findings of blood chemistry: Secondary | ICD-10-CM

## 2013-07-03 DIAGNOSIS — G629 Polyneuropathy, unspecified: Secondary | ICD-10-CM

## 2013-07-03 DIAGNOSIS — G609 Hereditary and idiopathic neuropathy, unspecified: Secondary | ICD-10-CM

## 2013-07-03 LAB — D-DIMER, QUANTITATIVE: D-Dimer, Quant: 1.15 ug/mL-FEU — ABNORMAL HIGH (ref 0.00–0.48)

## 2013-07-03 NOTE — Telephone Encounter (Signed)
Contacted patient's husband, he confirmed that she's unable to get ultrasound done as she can't leave work, discussed signs that would warrant emergent need for duplex at local ED, he confirms understanding of scheduling for duplex at [facility] at 8am tomorrow.

## 2013-07-03 NOTE — Progress Notes (Signed)
CC: Catherine Phillips is a 67 y.o. female is here for Foot Swelling   Subjective: HPI:  Nerve conduction studies from [name] are reportedly negative for large fiber neuropathy cannot rule out small fiber neuropathy recommendation of skin biopsy through [name] for definitive diagnosis.  Patient states that symptoms of burning in both feet and numbness are completely gone after using Lyrica for 2 weeks she is extremely happy with symptom resolution. She expresses concerns that she occasionally gets dizzy soon after taking a dose of Lyrica.  Patient complains of right leg swelling: periodic for the last month, fine in the morning but worse at the end of the day improves with elevation.  On feet all day at work and worse those days, not as noticeable on days she's not at work and resting with legs elevated. She does not believe she had this before starting we're. She denies swelling elsewhere. It is localized to the foot and distal shin. It is not associated with pain redness nor warmth at the site of swelling. She denies recent injury or vein manipulation in that extremity. Denies fevers, chills, rapid heartbeat, shortness of breath, nor limb claudication  Recent immobility: no Recent bedridden status:no Swelling greater than 3 cm relative to left: no Swelling of entire leg: no Known active cancer:  no Tenderness along the deep veins:no Collateral superficial veins: no Pitting edema on the affected leg: yes Alternate diagnosis more likely: no     Review Of Systems Outlined In HPI  Past Medical History  Diagnosis Date  . COPD (chronic obstructive pulmonary disease)   . Hypertension   . Emphysema of lung   . Anxiety 06/24/2012  . Essential hypertension 06/24/2012  . Emphysema 06/24/2012  . History of Left leg claudication 06/24/2012     Family History  Problem Relation Age of Onset  . Cancer Mother   . Heart attack Father   . Deep vein thrombosis Father   . Hyperlipidemia  Father   . Hypertension Father      History  Substance Use Topics  . Smoking status: Former Smoker -- 1.50 packs/day for 30 years    Types: Cigarettes    Quit date: 12/30/2009  . Smokeless tobacco: Never Used  . Alcohol Use: No     Objective: Filed Vitals:   07/03/13 0820  BP: 138/70  Pulse: 73    General: Alert and Oriented, No Acute Distress HEENT: Pupils equal, round, reactive to light. Conjunctivae clear.  Moist membranes pharynx unremarkable Lungs: Clear to auscultation bilaterally, no wheezing/ronchi/rales.  Comfortable work of breathing. Good air movement. Cardiac: Regular rate and rhythm. Normal S1/S2.  No murmurs, rubs, nor gallops.   Extremities: There is trace pitting edema from the forefoot oxalate to the midshin on the right lower leg.  Strong peripheral pulses.  The right calf is measuring at 29-1/2 cm relative to 29 cm on the left 10 cm distal from the tibial tuberosity. Mental Status: No depression, anxiety, nor agitation. Skin: Warm and dry.  Assessment & Plan: Shalin was seen today for foot swelling.  Diagnoses and associated orders for this visit:  Edema - D-dimer, quantitative  Peripheral neuropathy    Edema: Wells criteria scores were at 1 moderate probability of DVT warranting d-dimer, patient is agreeable to venous ultrasound if d-dimer is not negative. Peripheral neuropathy: Improved given side effects of Lyrica take dosages of 50 mg only twice a day  Return if symptoms worsen or fail to improve.

## 2013-07-03 NOTE — Telephone Encounter (Signed)
Pt's D- dimer came back elevated. Per Dr. Ivan Anchors pt needs to be set up for a venous duplex US of the rt lower extremity. I called pt but she was already at work per her husband. Notified her husband and let him know that Dr. Ivan Anchors says its ok if she is at work but the next step would be to have this U/S done no later than tomorrow am. Will get pt set up at [facility]  Pt is scheduled tomorrow am @ 745. Pt's spouse notified. Order faxed to [facility] [phone]

## 2013-07-03 NOTE — Telephone Encounter (Signed)
Request sent via fax.

## 2013-07-03 NOTE — Telephone Encounter (Signed)
Sue Lush, Can you please request records from [facility] for Caylen's EMG/NCV from earlier this week, I can see that Dr. Dan Humphreys performed it through care everywhere but I don't see any results yet.

## 2013-07-04 NOTE — Telephone Encounter (Signed)
Pt notified that venous ultrasound negative. Suspect venous insufficiency +/- side effect of lyrica

## 2013-07-09 ENCOUNTER — Telehealth: Payer: Self-pay | Admitting: *Deleted

## 2013-07-09 NOTE — Telephone Encounter (Signed)
Pt called and left a message that she is having muscle spasms in her rt hip. She has tried a heating pad and advil and this has not helped. Pt wants to know if there is anything else that can be done

## 2013-07-10 ENCOUNTER — Ambulatory Visit (INDEPENDENT_AMBULATORY_CARE_PROVIDER_SITE_OTHER): Payer: 59 | Admitting: Family Medicine

## 2013-07-10 ENCOUNTER — Encounter: Payer: Self-pay | Admitting: Family Medicine

## 2013-07-10 VITALS — BP 148/69 | HR 82 | Wt 113.0 lb

## 2013-07-10 DIAGNOSIS — S76011A Strain of muscle, fascia and tendon of right hip, initial encounter: Secondary | ICD-10-CM

## 2013-07-10 DIAGNOSIS — IMO0002 Reserved for concepts with insufficient information to code with codable children: Secondary | ICD-10-CM

## 2013-07-10 MED ORDER — ORPHENADRINE CITRATE ER 100 MG PO TB12: 100.0000 mg | ORAL_TABLET | Freq: Two times a day (BID) | ORAL | Status: DC

## 2013-07-10 MED ORDER — MELOXICAM 15 MG PO TABS: 15.0000 mg | ORAL_TABLET | Freq: Every day | ORAL | Status: DC

## 2013-07-10 MED ORDER — KETOROLAC TROMETHAMINE 60 MG/2ML IM SOLN
60.0000 mg | Freq: Once | INTRAMUSCULAR | Status: AC
  Administered 2013-07-10: 60 mg via INTRAMUSCULAR

## 2013-07-10 NOTE — Progress Notes (Signed)
CC: Catherine Phillips is a 67 y.o. female is here for Hip Pain   Subjective: HPI:  Patient complain some right buttock pain is described as a soreness moderate in severity worse with standing or climbing, improves with rest lying down or sitting. Has been present for approximately 2-3 days no better or worse since onset. onset was gradual. Slight improvement with ibuprofen nothing else makes better or worse. Denies any recent over exertion or trauma. She denies hip pain, groin pain, nor leg discomfort otherwise. Denies motor or sensory disturbances in the leg nor low back pain.    Review Of Systems Outlined In HPI  Past Medical History  Diagnosis Date  . COPD (chronic obstructive pulmonary disease)   . Hypertension   . Emphysema of lung   . Anxiety 06/24/2012  . Essential hypertension 06/24/2012  . Emphysema 06/24/2012  . History of Left leg claudication 06/24/2012     Family History  Problem Relation Age of Onset  . Cancer Mother   . Heart attack Father   . Deep vein thrombosis Father   . Hyperlipidemia Father   . Hypertension Father      History  Substance Use Topics  . Smoking status: Former Smoker -- 1.50 packs/day for 30 years    Types: Cigarettes    Quit date: 12/30/2009  . Smokeless tobacco: Never Used  . Alcohol Use: No     Objective: Filed Vitals:   07/10/13 0818  BP: 148/69  Pulse: 82    General: Alert and Oriented, No Acute Distress   Abdomen: Soft nontender Extremities: No peripheral edema.  Strong peripheral pulses. Full range of motion strength of both lower extremities, L4-S1 DTRs two over four bilaterally. On the right straight leg raise is negative log roll negative, FABER/FADIR negative.  No pain with palpation of ischial tuberosity, pain is reproduced with palpation of superior lateral gluteus palpation however no pain over right greater trochanter.  Mental Status: No depression, anxiety, nor agitation. Skin: Warm and dry.  Assessment & Plan: Alara  was seen today for hip pain.  Diagnoses and associated orders for this visit:  Strain of gluteus medius, right, initial encounter - meloxicam (MOBIC) 15 MG tablet; Take 1 tablet (15 mg total) by mouth daily. - orphenadrine (NORFLEX) 100 MG tablet; Take 1 tablet (100 mg total) by mouth 2 (two) times daily. For buttock spasm/pain. - ketorolac (TORADOL) injection 60 mg; Inject 2 mLs (60 mg total) into the muscle once.     encourage relative rest however start gluteal strain exercise/home exercise plan. Mobic and Norflex as needed Toradol given today as she is wanting to go work after the appointment.  Return if symptoms worsen or fail to improve.

## 2013-07-10 NOTE — Telephone Encounter (Signed)
Appears to have an appt today.

## 2013-07-17 NOTE — Telephone Encounter (Signed)
closed

## 2013-08-19 ENCOUNTER — Telehealth: Payer: Self-pay | Admitting: *Deleted

## 2013-08-19 DIAGNOSIS — G629 Polyneuropathy, unspecified: Secondary | ICD-10-CM

## 2013-08-19 MED ORDER — PREGABALIN 50 MG PO CAPS: 50.0000 mg | ORAL_CAPSULE | Freq: Two times a day (BID) | ORAL | Status: DC

## 2013-08-19 NOTE — Telephone Encounter (Signed)
[facility] called and wanted to know if they could change Lyrica rx to  # 270 disp for a 3 month supply. This is cheaper for pt. I did give them a verbal ok to Disp #270 with no additional refills

## 2013-08-22 ENCOUNTER — Encounter: Payer: Self-pay | Admitting: Physician Assistant

## 2013-08-22 ENCOUNTER — Ambulatory Visit (INDEPENDENT_AMBULATORY_CARE_PROVIDER_SITE_OTHER): Payer: 59 | Admitting: Physician Assistant

## 2013-08-22 VITALS — BP 115/58 | HR 85 | Temp 97.6°F | Wt 111.0 lb

## 2013-08-22 DIAGNOSIS — J441 Chronic obstructive pulmonary disease with (acute) exacerbation: Secondary | ICD-10-CM

## 2013-08-22 DIAGNOSIS — J439 Emphysema, unspecified: Secondary | ICD-10-CM | POA: Insufficient documentation

## 2013-08-22 DIAGNOSIS — J438 Other emphysema: Secondary | ICD-10-CM

## 2013-08-22 DIAGNOSIS — J069 Acute upper respiratory infection, unspecified: Secondary | ICD-10-CM

## 2013-08-22 MED ORDER — PREDNISONE 50 MG PO TABS: ORAL_TABLET | ORAL | Status: DC

## 2013-08-22 MED ORDER — AZITHROMYCIN 250 MG PO TABS: ORAL_TABLET | ORAL | Status: DC

## 2013-08-22 NOTE — Patient Instructions (Signed)
Mucinex, rest.   Upper Respiratory Infection, Adult An upper respiratory infection (URI) is also known as the common cold. It is often caused by a type of germ (virus). Colds are easily spread (contagious). You can pass it to others by kissing, coughing, sneezing, or drinking out of the same glass. Usually, you get better in 1 or 2 weeks.  HOME CARE   Only take medicine as told by your doctor.  Use a warm mist humidifier or breathe in steam from a hot shower.  Drink enough water and fluids to keep your pee (urine) clear or pale yellow.  Get plenty of rest.  Return to work when your temperature is back to normal or as told by your doctor. You may use a face mask and wash your hands to stop your cold from spreading. GET HELP RIGHT AWAY IF:   After the first few days, you feel you are getting worse.  You have questions about your medicine.  You have chills, shortness of breath, or brown or red spit (mucus).  You have yellow or brown snot (nasal discharge) or pain in the face, especially when you bend forward.  You have a fever, puffy (swollen) neck, pain when you swallow, or white spots in the back of your throat.  You have a bad headache, ear pain, sinus pain, or chest pain.  You have a high-pitched whistling sound when you breathe in and out (wheezing).  You have a lasting cough or cough up blood.  You have sore muscles or a stiff neck. MAKE SURE YOU:   Understand these instructions.  Will watch your condition.  Will get help right away if you are not doing well or get worse. Document Released: 02/21/2008 Document Revised: 11/27/2011 Document Reviewed: 01/09/2011 [facility] Patient Information 2014 [place], [place].

## 2013-08-22 NOTE — Progress Notes (Signed)
   Subjective:    Patient ID: Catherine Phillips, female    DOB: 05/28/1946, 67 y.o.   MRN: [id number]  HPI Patient is a 67 year old female who presents to the clinic with cough for the last 2 days. Patient lost her voice at the beginning of the week. She has progressively picked up a cough that seems to be going into her chest. She denies any shortness of breath or mucus production at this time. She has been using Spiriva regularly for her COPD/emphysema. She has not had to use any rescue inhalers. She denies any fever, chills, nausea, vomiting, sinus pressure or pain. She's concerned because of the weekend and her COPD can decompensate rapidly. She has not tried anything over-the-counter to make better. She does work at [employer] and exposed to a lot of different viruses and germs. She is a former smoker but has not smoked since 2001.    Review of Systems     Objective:   Physical Exam  Constitutional: She is oriented to person, place, and time. She appears well-developed and well-nourished.  HENT:  Head: Normocephalic and atraumatic.  Right Ear: External ear normal.  Left Ear: External ear normal.  Nose: Nose normal.  Mouth/Throat: Oropharynx is clear and moist.  Eyes: Conjunctivae are normal. Right eye exhibits no discharge. Left eye exhibits no discharge.  Neck: Normal range of motion. Neck supple.  Cardiovascular: Normal rate, regular rhythm and normal heart sounds.   Pulmonary/Chest: Breath sounds normal.  Coarse breath sounds. No wheezing or rhonchi.  Lymphadenopathy:    She has no cervical adenopathy.  Neurological: She is alert and oriented to person, place, and time.  Skin: Skin is warm and dry.  Psychiatric: She has a normal mood and affect. Her behavior is normal.          Assessment & Plan:  COPD exacerbation/URI- I. do not feel at this time patient needs an antibiotic I think that this is a virus that is potentially leaving some inflammation of the chest. I did give her  prednisone for 5 days. Discuss with patient that if production increases or spikes a fever that she could use zpak that was printed off. Encouraged her to use guaifenesin to help loosen up any sputum and head or chest. If chest becomes tight can use short acting albuterol to help relieve symptoms.

## 2013-09-01 ENCOUNTER — Telehealth: Payer: Self-pay

## 2013-09-01 DIAGNOSIS — R05 Cough: Secondary | ICD-10-CM

## 2013-09-01 MED ORDER — BENZONATATE 100 MG PO CAPS: 100.0000 mg | ORAL_CAPSULE | Freq: Three times a day (TID) | ORAL | Status: DC | PRN

## 2013-09-01 NOTE — Telephone Encounter (Signed)
Sue Lush, Will you please let Mrs. Liskey know that I've [facility]on [facility] [address] which should help her lingering symptoms.

## 2013-09-01 NOTE — Telephone Encounter (Signed)
She still has sore throat, cough and now she has stomach pain. She finished the Zpack and prednisone. Denies fever, chills and sweats. What else can she take for her sore throat and cough.

## 2013-09-01 NOTE — Telephone Encounter (Signed)
Pt.notified

## 2013-10-24 ENCOUNTER — Other Ambulatory Visit: Payer: Self-pay | Admitting: Family Medicine

## 2013-10-27 ENCOUNTER — Other Ambulatory Visit: Payer: Self-pay | Admitting: Family Medicine

## 2013-10-28 ENCOUNTER — Ambulatory Visit (INDEPENDENT_AMBULATORY_CARE_PROVIDER_SITE_OTHER): Payer: 59 | Admitting: Family Medicine

## 2013-10-28 ENCOUNTER — Encounter: Payer: Self-pay | Admitting: Family Medicine

## 2013-10-28 VITALS — BP 129/76 | HR 79 | Wt 114.0 lb

## 2013-10-28 DIAGNOSIS — G629 Polyneuropathy, unspecified: Secondary | ICD-10-CM

## 2013-10-28 DIAGNOSIS — Z5181 Encounter for therapeutic drug level monitoring: Secondary | ICD-10-CM

## 2013-10-28 DIAGNOSIS — I10 Essential (primary) hypertension: Secondary | ICD-10-CM

## 2013-10-28 DIAGNOSIS — E785 Hyperlipidemia, unspecified: Secondary | ICD-10-CM

## 2013-10-28 DIAGNOSIS — I739 Peripheral vascular disease, unspecified: Secondary | ICD-10-CM

## 2013-10-28 DIAGNOSIS — Z79899 Other long term (current) drug therapy: Secondary | ICD-10-CM

## 2013-10-28 DIAGNOSIS — G609 Hereditary and idiopathic neuropathy, unspecified: Secondary | ICD-10-CM

## 2013-10-28 LAB — COMPLETE METABOLIC PANEL WITH GFR
ALT: 10 U/L (ref 0–35)
AST: 18 U/L (ref 0–37)
Albumin: 4.4 g/dL (ref 3.5–5.2)
Alkaline Phosphatase: 53 U/L (ref 39–117)
BILIRUBIN TOTAL: 0.4 mg/dL (ref 0.2–1.2)
BUN: 16 mg/dL (ref 6–23)
CO2: 28 meq/L (ref 19–32)
Calcium: 9.8 mg/dL (ref 8.4–10.5)
Chloride: 102 mEq/L (ref 96–112)
Creat: 0.99 mg/dL (ref 0.50–1.10)
GFR, EST AFRICAN AMERICAN: 68 mL/min
GFR, EST NON AFRICAN AMERICAN: 59 mL/min — AB
GLUCOSE: 86 mg/dL (ref 70–99)
Potassium: 4 mEq/L (ref 3.5–5.3)
Sodium: 141 mEq/L (ref 135–145)
Total Protein: 6.7 g/dL (ref 6.0–8.3)

## 2013-10-28 LAB — LIPID PANEL
Cholesterol: 263 mg/dL — ABNORMAL HIGH (ref 0–200)
HDL: 60 mg/dL (ref >39)
LDL Cholesterol: 164 mg/dL — ABNORMAL HIGH (ref 0–99)
Total CHOL/HDL Ratio: 4.4 Ratio
Triglycerides: 195 mg/dL — ABNORMAL HIGH (ref <150)
VLDL: 39 mg/dL (ref 0–40)

## 2013-10-28 MED ORDER — PREGABALIN 50 MG PO CAPS: 50.0000 mg | ORAL_CAPSULE | Freq: Three times a day (TID) | ORAL | Status: DC

## 2013-10-28 NOTE — Progress Notes (Signed)
CC: Catherine Phillips is a 68 y.o. female is here for Hypertension and Hyperlipidemia   Subjective: HPI:  Followup peripheral neuropathy: She continues to take Lyrica 50 mg 3 times a day reports that her pain is 98% resolved since I saw her before starting Lyrica. She is extremely happy with her current state of health with respect to peripheral neuropathy. Symptoms come on about half an hour before she is due for every 8 hour dosing he described as burning on the top and bottom of the feet and tingling which is mild in severity. She's on her feet the majority of the day she continues to work at [employer]. Denies any sensory disturbance in the feet can still feel hot and cold through the bottom of her feet  Followup essential hypertension: Continues on amlodipine and hydrochlorothiazide no outside blood pressures to report your denies chest pain shortness of breath orthopnea nor peripheral edema   history of leg claudication: States that at rest and with exertion she denies any cramping or discomfort in either leg.  Hyperlipidemia: She stopped taking Livalo about 2 or 3 months ago for no particular reason she denies any known side effects. No formal exercise routine she admits that she tries to eat healthy however does crave infrequently eats ice cream at night.   Review Of Systems Outlined In HPI  Past Medical History  Diagnosis Date  . COPD (chronic obstructive pulmonary disease)   . Hypertension   . Emphysema of lung   . Anxiety 06/24/2012  . Essential hypertension 06/24/2012  . Emphysema 06/24/2012  . History of Left leg claudication 06/24/2012    Past Surgical History  Procedure Laterality Date  . Abdominal aortagram  12/09/12   Family History  Problem Relation Age of Onset  . Cancer Mother   . Heart attack Father   . Deep vein thrombosis Father   . Hyperlipidemia Father   . Hypertension Father     History   Social History  . Marital Status: Married    Spouse Name: N/A   Number of Children: N/A  . Years of Education: N/A   Occupational History  . Not on file.   Social History Main Topics  . Smoking status: Former Smoker -- 1.50 packs/day for 30 years    Types: Cigarettes    Quit date: 12/30/2009  . Smokeless tobacco: Never Used  . Alcohol Use: No  . Drug Use: No  . Sexual Activity: Not on file   Other Topics Concern  . Not on file   Social History Narrative  . No narrative on file     Objective: BP 129/76  Pulse 79  Wt 114 lb (51.71 kg)  General: Alert and Oriented, No Acute Distress HEENT: Pupils equal, round, reactive to light. Conjunctivae clear.  moist membranes pharynx unremarkable  Lungs: Clear to auscultation bilaterally, no wheezing/ronchi/rales.  Comfortable work of breathing. Good air movement. Cardiac: Regular rate and rhythm. Normal S1/S2.  No murmurs, rubs, nor gallops.   Extremities: No peripheral edema.  Strong peripheral pulses.  Mental Status: No depression, anxiety, nor agitation. Skin: Warm and dry.  Assessment & Plan: Emmery was seen today for hypertension and hyperlipidemia.  Diagnoses and associated orders for this visit:  Peripheral neuropathy - pregabalin (LYRICA) 50 MG capsule; Take 1 capsule (50 mg total) by mouth 3 (three) times daily.  Essential hypertension - COMPLETE METABOLIC PANEL WITH GFR  History of Left leg claudication  Hyperlipidemia LDL goal <70 - Lipid panel  Encounter for monitoring  statin therapy - Lipid panel    Peripheral neuropathy: Controlled continue Lyrica 3 times a day   essential hypertension: Checking renal function continue amlodipine and hydrochlorothiazide History of left leg claudication: Stable continue aspirin Hyperlipidemia: Recheck cholesterol: She needs to restart a statin we will determine whether or not restart Lipitor or Livalo based on how high her LDL is  Return in about 3 months (around 01/25/2014).

## 2013-10-29 ENCOUNTER — Encounter: Payer: Self-pay | Admitting: Family Medicine

## 2013-10-29 ENCOUNTER — Telehealth: Payer: Self-pay | Admitting: Family Medicine

## 2013-10-29 MED ORDER — PITAVASTATIN CALCIUM 2 MG PO TABS: 2.0000 mg | ORAL_TABLET | Freq: Every day | ORAL | Status: DC

## 2013-10-29 NOTE — Telephone Encounter (Signed)
Left message on home phone.

## 2013-10-29 NOTE — Telephone Encounter (Signed)
Left message on home phone number with results

## 2013-10-29 NOTE — Telephone Encounter (Signed)
Seth Bake, Will you please let Mrs. Buescher know that her cholesterol is high enough to where I would strongly encourage her to restart Livalo.  We'll need to adjust this to her only needing to take 2mg  daily.  [facility], she may want to come by and pick up a savings card to see if this cuts down on the price.  REturn in 3 months for recheck. No other abnormalites on her lab work.

## 2013-11-24 ENCOUNTER — Other Ambulatory Visit: Payer: Self-pay | Admitting: Family Medicine

## 2013-12-24 ENCOUNTER — Telehealth: Payer: Self-pay | Admitting: Family Medicine

## 2013-12-24 MED ORDER — PITAVASTATIN CALCIUM 2 MG PO TABS: 2.0000 mg | ORAL_TABLET | Freq: Every day | ORAL | Status: DC

## 2013-12-24 NOTE — Telephone Encounter (Signed)
rquesting refills

## 2014-01-09 ENCOUNTER — Encounter: Payer: Self-pay | Admitting: Family Medicine

## 2014-01-09 ENCOUNTER — Ambulatory Visit (INDEPENDENT_AMBULATORY_CARE_PROVIDER_SITE_OTHER): Payer: 59

## 2014-01-09 ENCOUNTER — Ambulatory Visit (INDEPENDENT_AMBULATORY_CARE_PROVIDER_SITE_OTHER): Payer: 59 | Admitting: Family Medicine

## 2014-01-09 VITALS — BP 127/63 | HR 85 | Temp 97.6°F | Wt 114.0 lb

## 2014-01-09 DIAGNOSIS — R05 Cough: Secondary | ICD-10-CM

## 2014-01-09 DIAGNOSIS — R059 Cough, unspecified: Secondary | ICD-10-CM

## 2014-01-09 DIAGNOSIS — R079 Chest pain, unspecified: Secondary | ICD-10-CM

## 2014-01-09 DIAGNOSIS — R350 Frequency of micturition: Secondary | ICD-10-CM

## 2014-01-09 LAB — POCT URINALYSIS DIPSTICK
Bilirubin, UA: NEGATIVE
Blood, UA: NEGATIVE
GLUCOSE UA: NEGATIVE
Ketones, UA: NEGATIVE
Leukocytes, UA: NEGATIVE
NITRITE UA: NEGATIVE
Protein, UA: NEGATIVE
Spec Grav, UA: 1.01
UROBILINOGEN UA: 0.2
pH, UA: 6.5

## 2014-01-09 MED ORDER — SULFAMETHOXAZOLE-TRIMETHOPRIM 800-160 MG PO TABS
ORAL_TABLET | ORAL | Status: AC
Start: 2014-01-09 — End: 2014-01-14

## 2014-01-09 NOTE — Progress Notes (Signed)
CC: Catherine Phillips is a 68 y.o. female is here for urinary frequency and lower back pain   Subjective: HPI:  Complains of right flank pain that has been present for the past 4 days. Accompanied by urinary urgency and frequency without dysuria. Pain is described as a dullness mild in severity. Worse with coughing. Improves with rest. Nothing else particularly makes better or worse. Mild radiation of pain into posterior right chest. Has been accompanied by mild increase in frequency of chronic cough but no sputum production. Denies blood in urine. Denies fevers, chills, abdominal pain, nausea, vomiting, diarrhea, constipation, shortness of breath or wheezing.   Review Of Systems Outlined In HPI  Past Medical History  Diagnosis Date  . COPD (chronic obstructive pulmonary disease)   . Hypertension   . Emphysema of lung   . Anxiety 06/24/2012  . Essential hypertension 06/24/2012  . Emphysema 06/24/2012  . History of Left leg claudication 06/24/2012    Past Surgical History  Procedure Laterality Date  . Abdominal aortagram  12/09/12   Family History  Problem Relation Age of Onset  . Cancer Mother   . Heart attack Father   . Deep vein thrombosis Father   . Hyperlipidemia Father   . Hypertension Father     History   Social History  . Marital Status: Married    Spouse Name: N/A    Number of Children: N/A  . Years of Education: N/A   Occupational History  . Not on file.   Social History Main Topics  . Smoking status: Former Smoker -- 1.50 packs/day for 30 years    Types: Cigarettes    Quit date: 12/30/2009  . Smokeless tobacco: Never Used  . Alcohol Use: No  . Drug Use: No  . Sexual Activity: Not on file   Other Topics Concern  . Not on file   Social History Narrative  . No narrative on file     Objective: BP 127/63  Pulse 85  Temp(Src) 97.6 F (36.4 C) (Oral)  Wt 114 lb (51.71 kg)  General: Alert and Oriented, No Acute Distress HEENT: Pupils equal, round, reactive  to light. Conjunctivae clear.  Moist heat his membranes pharynx unremarkable Lungs: Distant breath sounds. Clear to auscultation bilaterally, no wheezing/ronchi/rales.  Comfortable work of breathing. . Cardiac: Regular rate and rhythm. Normal S1/S2.  No murmurs, rubs, nor gallops.   Abdomen: Soft and nontender Back: No midline spinous process tenderness in the thoracic region, there is mild right CVA tenderness Extremities: No peripheral edema.  Strong peripheral pulses.  Mental Status: No depression, anxiety, nor agitation. Skin: Warm and dry.  Assessment & Plan: Sherlon was seen today for urinary frequency and lower back pain.  Diagnoses and associated orders for this visit:  Chest pain - DG Chest 2 View; Future  Urinary frequency - sulfamethoxazole-trimethoprim (SEPTRA DS) 800-160 MG per tablet; One tab twice a day for five days. - Urinalysis Dipstick    Urinary frequency with flank pain: She was unable to provide a urine sample today but will bring one in from home. I've asked her to hold off on starting Bactrim until urine sample is obtained then start 5 day course of Bactrim pending urine culture. Suspect mild pyelonephritis. Chest pain: Chest x-ray was obtained to rule out pulmonary process causing right flank pain, personal interpretation shows no acute cardiopulmonary abnormalities.   Return in about 4 weeks (around 02/06/2014) for Lipid check.

## 2014-01-09 NOTE — Addendum Note (Signed)
Addended by: Terance Hart on: 01/09/2014 02:45 PM   Modules accepted: Orders

## 2014-01-11 LAB — URINE CULTURE: Colony Count: 50000

## 2014-01-21 ENCOUNTER — Ambulatory Visit: Payer: 59 | Admitting: Vascular Surgery

## 2014-01-21 ENCOUNTER — Encounter (HOSPITAL_COMMUNITY): Payer: 59

## 2014-01-26 ENCOUNTER — Other Ambulatory Visit: Payer: Self-pay | Admitting: Family Medicine

## 2014-04-21 ENCOUNTER — Ambulatory Visit (INDEPENDENT_AMBULATORY_CARE_PROVIDER_SITE_OTHER): Payer: 59 | Admitting: Family Medicine

## 2014-04-21 ENCOUNTER — Encounter: Payer: Self-pay | Admitting: Family Medicine

## 2014-04-21 ENCOUNTER — Ambulatory Visit (INDEPENDENT_AMBULATORY_CARE_PROVIDER_SITE_OTHER): Payer: 59

## 2014-04-21 ENCOUNTER — Telehealth: Payer: Self-pay | Admitting: Family Medicine

## 2014-04-21 VITALS — BP 148/72 | HR 93 | Wt 108.0 lb

## 2014-04-21 DIAGNOSIS — E785 Hyperlipidemia, unspecified: Secondary | ICD-10-CM

## 2014-04-21 DIAGNOSIS — J441 Chronic obstructive pulmonary disease with (acute) exacerbation: Secondary | ICD-10-CM

## 2014-04-21 DIAGNOSIS — J438 Other emphysema: Secondary | ICD-10-CM

## 2014-04-21 DIAGNOSIS — R911 Solitary pulmonary nodule: Secondary | ICD-10-CM

## 2014-04-21 DIAGNOSIS — J189 Pneumonia, unspecified organism: Secondary | ICD-10-CM

## 2014-04-21 DIAGNOSIS — J8410 Pulmonary fibrosis, unspecified: Secondary | ICD-10-CM

## 2014-04-21 DIAGNOSIS — J439 Emphysema, unspecified: Secondary | ICD-10-CM

## 2014-04-21 MED ORDER — ALBUTEROL SULFATE HFA 108 (90 BASE) MCG/ACT IN AERS: INHALATION_SPRAY | RESPIRATORY_TRACT | Status: DC

## 2014-04-21 MED ORDER — TIOTROPIUM BROMIDE MONOHYDRATE 18 MCG IN CAPS: 18.0000 ug | ORAL_CAPSULE | Freq: Every day | RESPIRATORY_TRACT | Status: DC

## 2014-04-21 MED ORDER — IPRATROPIUM-ALBUTEROL 0.5-2.5 (3) MG/3ML IN SOLN
3.0000 mL | Freq: Once | RESPIRATORY_TRACT | Status: DC
  Administered 2014-04-21: 3 mL via RESPIRATORY_TRACT

## 2014-04-21 MED ORDER — PREDNISONE 20 MG PO TABS: ORAL_TABLET | ORAL | Status: AC

## 2014-04-21 MED ORDER — METHYLPREDNISOLONE SODIUM SUCC 125 MG IJ SOLR
125.0000 mg | Freq: Once | INTRAMUSCULAR | Status: AC
  Administered 2014-04-21: 125 mg via INTRAMUSCULAR

## 2014-04-21 MED ORDER — CEFDINIR 300 MG PO CAPS: 300.0000 mg | ORAL_CAPSULE | Freq: Two times a day (BID) | ORAL | Status: AC

## 2014-04-21 MED ORDER — ALBUTEROL SULFATE (2.5 MG/3ML) 0.083% IN NEBU: 2.5000 mg | INHALATION_SOLUTION | RESPIRATORY_TRACT | Status: DC | PRN

## 2014-04-21 MED ORDER — AZITHROMYCIN 250 MG PO TABS: ORAL_TABLET | ORAL | Status: AC

## 2014-04-21 NOTE — Addendum Note (Signed)
Addended by: Terance Hart on: 04/21/2014 02:53 PM   Modules accepted: Orders

## 2014-04-21 NOTE — Telephone Encounter (Signed)
Catherine Phillips, Will you please let [name] know that her xray showed what could be a pneumonia in the left upper lung.  I've sent in a combo of antibiotics for this, azithromycin and cefdinir, and I'd still like her to f/u with me later this week.  The radiologist has recommended a CT scan of her lungs to help determine why there is so much scar tissue in and around this site of pneumonia however there's no change to the medication regimen between now and when she gets called and scheduled for the CT scan

## 2014-04-21 NOTE — Telephone Encounter (Signed)
Pt.notified

## 2014-04-21 NOTE — Progress Notes (Signed)
CC: Catherine Phillips is a 68 y.o. female is here for Shortness of Breath   Subjective: HPI:  Complains of nonproductive cough, mild wheezing, shortness of breath that has been present for the last 3 days moderate in severity worse when at work and on her feet. Slightly improved when using albuterol inhaler however not much benefit from nebulizer though that formulation of albuterol expired one year ago. Has had subjective chills but denies fevers. Symptoms are present all hours of the day.  denies chest pain, confusion, limb claudication blood and sputum nor productive cough. Review of systems is positive for lightheadedness when shortness of breath is present.  Continues to use Spiriva on a daily basis.  Review Of Systems Outlined In HPI  Past Medical History  Diagnosis Date  . COPD (chronic obstructive pulmonary disease)   . Hypertension   . Emphysema of lung   . Anxiety 06/24/2012  . Essential hypertension 06/24/2012  . Emphysema 06/24/2012  . History of Left leg claudication 06/24/2012    Past Surgical History  Procedure Laterality Date  . Abdominal aortagram  12/09/12   Family History  Problem Relation Age of Onset  . Cancer Mother   . Heart attack Father   . Deep vein thrombosis Father   . Hyperlipidemia Father   . Hypertension Father     History   Social History  . Marital Status: Married    Spouse Name: N/A    Number of Children: N/A  . Years of Education: N/A   Occupational History  . Not on file.   Social History Main Topics  . Smoking status: Former Smoker -- 1.50 packs/day for 30 years    Types: Cigarettes    Quit date: 12/30/2009  . Smokeless tobacco: Never Used  . Alcohol Use: No  . Drug Use: No  . Sexual Activity: Not on file   Other Topics Concern  . Not on file   Social History Narrative  . No narrative on file     Objective: BP 148/72  Pulse 93  Wt 108 lb (48.988 kg)  SpO2 90%  General: Alert and Oriented, No Acute Distress HEENT: Pupils  equal, round, reactive to light. Conjunctivae clear.  External ears unremarkable, canals clear with intact TMs with appropriate landmarks.  Middle ear appears open without effusion. Pink inferior turbinates.  Moist mucous membranes, pharynx without inflammation nor lesions.  Neck supple without palpable lymphadenopathy nor abnormal masses. Lungs:  on initial exam distal breath sounds in all lung fields with trace end expiratory wheezing in all lung fields amplified when coughing. No rales or rhonchi  Cardiac: Regular rate and rhythm. Normal S1/S2.  No murmurs, rubs, nor gallops.   Extremities: No peripheral edema.  Strong peripheral pulses.  Mental Status: No depression, anxiety, nor agitation. Skin: Warm and dry.  Assessment & Plan: Catherine Phillips was seen today for shortness of breath.  Diagnoses and associated orders for this visit:  Pulmonary emphysema, unspecified emphysema type - albuterol (PROVENTIL) (2.5 MG/3ML) 0.083% nebulizer solution; Take 3 mLs (2.5 mg total) by nebulization every 4 (four) hours as needed for wheezing or shortness of breath. - tiotropium (SPIRIVA) 18 MCG inhalation capsule; Place 1 capsule (18 mcg total) into inhaler and inhale daily. - albuterol (PROVENTIL HFA;VENTOLIN HFA) 108 (90 BASE) MCG/ACT inhaler; Inhale two puffs every 4-6 hours only as needed for shortness of breath or wheezing.  COPD exacerbation - ipratropium-albuterol (DUONEB) 0.5-2.5 (3) MG/3ML nebulizer solution 3 mL; Take 3 mLs by nebulization once. - DG Chest  2 View; Future - predniSONE (DELTASONE) 20 MG tablet; Three tabs at once daily for five days.  Hyperlipidemia LDL goal <70 - Lipid panel    Following a DuoNeb treatment the patient states that she feels that she's breathing better, wheezing is absent when I listen to her. Oxygen saturation is brought up to 90% on room air. I've encouraged her to avoid going to work for the next 2 days, she received Solu-Medrol 125 mg IM today and instructed to  start prednisone burst today as well. Albuterol nebulizer at home every 4-6 hours scheduled for the next 48 hours. Chest x-ray today and followup in 2-3 days to recheck breathing status.Signs and symptoms requring emergent/urgent reevaluation were discussed with the patient.  COPD meds refers to only use non-expired medication for now on  She's due for a lipid panel and has been fasting today therefore we'll take advantage of the opportunity  Return in about 3 days (around 04/24/2014) for Breathing Recheck.

## 2014-04-22 ENCOUNTER — Telehealth: Payer: Self-pay | Admitting: Family Medicine

## 2014-04-22 LAB — LIPID PANEL
Cholesterol: 133 mg/dL (ref 0–200)
HDL: 52 mg/dL (ref >39)
LDL Cholesterol: 57 mg/dL (ref 0–99)
Total CHOL/HDL Ratio: 2.6 Ratio
Triglycerides: 120 mg/dL (ref <150)
VLDL: 24 mg/dL (ref 0–40)

## 2014-04-22 MED ORDER — PITAVASTATIN CALCIUM 2 MG PO TABS: 2.0000 mg | ORAL_TABLET | Freq: Every day | ORAL | Status: DC

## 2014-04-22 NOTE — Telephone Encounter (Signed)
Message left on vm 

## 2014-04-22 NOTE — Telephone Encounter (Signed)
Seth Bake, Will you please let patient know that her cholesterol looks fantastic and is at goal.  I'd recommend she continue taking Livalo and I've sent in more refills to her [facility].

## 2014-04-23 ENCOUNTER — Telehealth: Payer: Self-pay

## 2014-04-23 ENCOUNTER — Other Ambulatory Visit: Payer: Self-pay | Admitting: Family Medicine

## 2014-04-23 NOTE — Telephone Encounter (Signed)
Approval # [id number] for CT chest w/o contrast good through April 22, 2014 - October 5,2015./ Estil Daft

## 2014-04-24 ENCOUNTER — Ambulatory Visit: Payer: 59 | Admitting: Family Medicine

## 2014-04-28 ENCOUNTER — Encounter: Payer: Self-pay | Admitting: Family Medicine

## 2014-04-28 ENCOUNTER — Ambulatory Visit (INDEPENDENT_AMBULATORY_CARE_PROVIDER_SITE_OTHER): Payer: 59

## 2014-04-28 ENCOUNTER — Ambulatory Visit (INDEPENDENT_AMBULATORY_CARE_PROVIDER_SITE_OTHER): Payer: 59 | Admitting: Family Medicine

## 2014-04-28 VITALS — BP 129/74 | HR 78 | Temp 97.5°F | Wt 108.0 lb

## 2014-04-28 DIAGNOSIS — B370 Candidal stomatitis: Secondary | ICD-10-CM

## 2014-04-28 DIAGNOSIS — R918 Other nonspecific abnormal finding of lung field: Secondary | ICD-10-CM | POA: Insufficient documentation

## 2014-04-28 DIAGNOSIS — J189 Pneumonia, unspecified organism: Secondary | ICD-10-CM

## 2014-04-28 DIAGNOSIS — J449 Chronic obstructive pulmonary disease, unspecified: Secondary | ICD-10-CM

## 2014-04-28 DIAGNOSIS — J4489 Other specified chronic obstructive pulmonary disease: Secondary | ICD-10-CM

## 2014-04-28 DIAGNOSIS — R911 Solitary pulmonary nodule: Secondary | ICD-10-CM

## 2014-04-28 MED ORDER — NYSTATIN 100000 UNIT/ML MT SUSP: 5.0000 mL | Freq: Four times a day (QID) | OROMUCOSAL | Status: DC

## 2014-04-28 NOTE — Progress Notes (Signed)
CC: Catherine Phillips is a 68 y.o. female is here for recheck breathing   Subjective: HPI:  Followup community acquired pneumonia: Patient states that she believes her breathing is back to normal, she's no longer having any cough or wheezing. She's having some fatigue but this is only present on the days that she works from 4 AM until 5 PM at [employer]. She is the next 2 days off. She denies fevers, chills, chest pain, shortness of breath, orthopnea, peripheral edema nor motor or sensory disturbances. Her appetite is back and she is craving ice cream.   She complains of sore throat and a slight burning sensation in the back of the mouth at the present for the past 2-3 days. Nothing particularly makes it better or worse. It is mild in severity   Review Of Systems Outlined In HPI  Past Medical History  Diagnosis Date  . COPD (chronic obstructive pulmonary disease)   . Hypertension   . Emphysema of lung   . Anxiety 06/24/2012  . Essential hypertension 06/24/2012  . Emphysema 06/24/2012  . History of Left leg claudication 06/24/2012    Past Surgical History  Procedure Laterality Date  . Abdominal aortagram  12/09/12   Family History  Problem Relation Age of Onset  . Cancer Mother   . Heart attack Father   . Deep vein thrombosis Father   . Hyperlipidemia Father   . Hypertension Father     History   Social History  . Marital Status: Married    Spouse Name: N/A    Number of Children: N/A  . Years of Education: N/A   Occupational History  . Not on file.   Social History Main Topics  . Smoking status: Former Smoker -- 1.50 packs/day for 30 years    Types: Cigarettes    Quit date: 12/30/2009  . Smokeless tobacco: Never Used  . Alcohol Use: No  . Drug Use: No  . Sexual Activity: Not on file   Other Topics Concern  . Not on file   Social History Narrative  . No narrative on file     Objective: BP 129/74  Pulse 78  Temp(Src) 97.5 F (36.4 C) (Oral)  Wt 108 lb (48.988 kg)   SpO2 93%  General: Alert and Oriented, No Acute Distress HEENT: Pupils equal, round, reactive to light. Conjunctivae clear.  External ears unremarkable, canals clear with intact TMs with appropriate landmarks.  Middle ear appears open without effusion. Pink inferior turbinates.  Moist mucous membranes, pharynx with mild inflammation of the soft palate and white hue to the back of the tongue.  Neck supple without palpable lymphadenopathy nor abnormal masses. Lungs: Distant breath sounds without wheezing rhonchi or rales. Cardiac: Regular rate and rhythm. Normal S1/S2.  No murmurs, rubs, nor gallops.   Extremities: No peripheral edema.  Strong peripheral pulses.  Mental Status: No depression, anxiety, nor agitation. Skin: Warm and dry.  Assessment & Plan: Catherine Phillips was seen today for recheck breathing.  Diagnoses and associated orders for this visit:  Thrush - nystatin (MYCOSTATIN) 100000 UNIT/ML suspension; Take 5 mLs (500,000 Units total) by mouth 4 (four) times daily. Use for 3-4 days after symptoms resolve.  CAP (community acquired pneumonia)    Community acquired pneumonia: Resolving continue full regimen of Omnicef, followup downstairs today for CT scan due to recent abnormal x-ray Thrush: Start nystatin throughout the course of finishing Omnicef Return if symptoms worsen or fail to improve.

## 2014-06-11 ENCOUNTER — Ambulatory Visit (INDEPENDENT_AMBULATORY_CARE_PROVIDER_SITE_OTHER): Payer: 59 | Admitting: Family Medicine

## 2014-06-11 ENCOUNTER — Encounter: Payer: Self-pay | Admitting: Family Medicine

## 2014-06-11 VITALS — BP 144/66 | HR 71 | Ht 62.0 in | Wt 108.0 lb

## 2014-06-11 DIAGNOSIS — B070 Plantar wart: Secondary | ICD-10-CM

## 2014-06-11 DIAGNOSIS — Z23 Encounter for immunization: Secondary | ICD-10-CM

## 2014-06-11 NOTE — Progress Notes (Signed)
CC: Catherine Phillips is a 68 y.o. female is here for left foot pain   Subjective: HPI:  Pain of the left heel it has been present for the past 2-3 months a daily basis. Present only when standing never with rest or elevation. Interventions have included shoe inserts, heel cups, offloading stickers all of which have been partially effective however only temporarily. She's never had this before. No other interventions other than above. There has been swelling and a growth at the site of discomfort. She denies skin changes elsewhere. She has chronic joint pain in his feet but this pain is much different than described as a point sitting into her foot. There's been no chills, fevers, bruising, nor joint swelling    Review Of Systems Outlined In HPI  Past Medical History  Diagnosis Date  . COPD (chronic obstructive pulmonary disease)   . Hypertension   . Emphysema of lung   . Anxiety 06/24/2012  . Essential hypertension 06/24/2012  . Emphysema 06/24/2012  . History of Left leg claudication 06/24/2012    Past Surgical History  Procedure Laterality Date  . Abdominal aortagram  12/09/12   Family History  Problem Relation Age of Onset  . Cancer Mother   . Heart attack Father   . Deep vein thrombosis Father   . Hyperlipidemia Father   . Hypertension Father     History   Social History  . Marital Status: Married    Spouse Name: N/A    Number of Children: N/A  . Years of Education: N/A   Occupational History  . Not on file.   Social History Main Topics  . Smoking status: Former Smoker -- 1.50 packs/day for 30 years    Types: Cigarettes    Quit date: 12/30/2009  . Smokeless tobacco: Never Used  . Alcohol Use: No  . Drug Use: No  . Sexual Activity: Not on file   Other Topics Concern  . Not on file   Social History Narrative  . No narrative on file     Objective: BP 144/66  Pulse 71  Ht 5\' 2"  (1.575 m)  Wt 108 lb (48.988 kg)  BMI 19.75 kg/m2  Vital signs  reviewed. General: Alert and Oriented, No Acute Distress HEENT: Pupils equal, round, reactive to light. Conjunctivae clear.  External ears unremarkable.  Moist mucous membranes. Lungs: Clear and comfortable work of breathing, speaking in full sentences without accessory muscle use. Cardiac: Regular rate and rhythm.  Neuro: CN II-XII grossly intact, gait normal. Extremities: No peripheral edema.  Strong peripheral pulses.  Mental Status: No depression, anxiety, nor agitation. Logical though process. Skin: Warm and dry. Plantar wart with moderate callus on the plantar surface of the left heel  Assessment & Plan: Nailyn was seen today for left foot pain.  Diagnoses and associated orders for this visit:  Plantar wart    Verbal consent obtained, chlorhexidine used to clean the heel, plantar wart was removed using a 10 blade scalpel and gentle shaving of the callus. There was scant bleeding after the core of the wart was easily removed. She had immediate pain relief and after the procedure she was able to walk out of the office without any pain whatsoever.  Flu shot received today  25 minutes spent face-to-face during visit today of which at least 50% was counseling or coordinating care regarding: 1. Plantar wart       Return if symptoms worsen or fail to improve.

## 2014-06-22 ENCOUNTER — Other Ambulatory Visit: Payer: Self-pay | Admitting: *Deleted

## 2014-06-22 DIAGNOSIS — G629 Polyneuropathy, unspecified: Secondary | ICD-10-CM

## 2014-06-22 MED ORDER — PREGABALIN 50 MG PO CAPS: 50.0000 mg | ORAL_CAPSULE | Freq: Three times a day (TID) | ORAL | Status: DC

## 2014-06-22 NOTE — Telephone Encounter (Signed)
Dr. Ileene Rubens said that it was okay to reorder this medication. Margette Fast, CMA

## 2014-07-09 ENCOUNTER — Ambulatory Visit (INDEPENDENT_AMBULATORY_CARE_PROVIDER_SITE_OTHER): Payer: 59 | Admitting: Family Medicine

## 2014-07-09 ENCOUNTER — Encounter: Payer: Self-pay | Admitting: Family Medicine

## 2014-07-09 VITALS — BP 146/72 | HR 75 | Temp 97.7°F | Wt 108.0 lb

## 2014-07-09 DIAGNOSIS — K5732 Diverticulitis of large intestine without perforation or abscess without bleeding: Secondary | ICD-10-CM

## 2014-07-09 DIAGNOSIS — R300 Dysuria: Secondary | ICD-10-CM

## 2014-07-09 LAB — POCT URINALYSIS DIPSTICK
Bilirubin, UA: NEGATIVE
GLUCOSE UA: NEGATIVE
Ketones, UA: NEGATIVE
NITRITE UA: NEGATIVE
PROTEIN UA: NEGATIVE
Spec Grav, UA: 1.01
UROBILINOGEN UA: 0.2
pH, UA: 6

## 2014-07-09 MED ORDER — METRONIDAZOLE 500 MG PO TABS: 500.0000 mg | ORAL_TABLET | Freq: Three times a day (TID) | ORAL | Status: DC

## 2014-07-09 MED ORDER — CIPROFLOXACIN HCL 500 MG PO TABS: 500.0000 mg | ORAL_TABLET | Freq: Two times a day (BID) | ORAL | Status: DC

## 2014-07-09 NOTE — Progress Notes (Signed)
CC: Catherine Phillips is a 68 y.o. female is here for pain in left side   Subjective: HPI:  Dysuria localized deep in the pelvis mild to moderate in severity it has been present for one to 2 weeks. Nothing seems to make it better or worse. Accompanied by chills. Interventions have included ibuprofen and TUMS. Accompanied by left lower quadrant pain that radiates into the back it is improved with either ibuprofen or TUMS. This began earlier this week and comes and goes throughout the day nothing particularly makes it worse. No changed bowel movements. she was awoken from this pain earlier today which is new to her.  Denies blood in urine or stool. Denies melena. Denies decreased appetite constipation, diarrhea, nausea or vomiting. Denies flank pain. Denies cough shortness of breath nor rapid heartbeat  Review Of Systems Outlined In HPI  Past Medical History  Diagnosis Date  . COPD (chronic obstructive pulmonary disease)   . Hypertension   . Emphysema of lung   . Anxiety 06/24/2012  . Essential hypertension 06/24/2012  . Emphysema 06/24/2012  . History of Left leg claudication 06/24/2012    Past Surgical History  Procedure Laterality Date  . Abdominal aortagram  12/09/12   Family History  Problem Relation Age of Onset  . Cancer Mother   . Heart attack Father   . Deep vein thrombosis Father   . Hyperlipidemia Father   . Hypertension Father     History   Social History  . Marital Status: Married    Spouse Name: N/A    Number of Children: N/A  . Years of Education: N/A   Occupational History  . Not on file.   Social History Main Topics  . Smoking status: Former Smoker -- 1.50 packs/day for 30 years    Types: Cigarettes    Quit date: 12/30/2009  . Smokeless tobacco: Never Used  . Alcohol Use: No  . Drug Use: No  . Sexual Activity: Not on file   Other Topics Concern  . Not on file   Social History Narrative  . No narrative on file     Objective: BP 146/72  Pulse 75   Temp(Src) 97.7 F (36.5 C) (Oral)  Wt 108 lb (48.988 kg)  General: Alert and Oriented, No Acute Distress HEENT: Pupils equal, round, reactive to light. Conjunctivae clear.  Moist membranes pharynx unremarkable Lungs: Clear to auscultation bilaterally, no wheezing/ronchi/rales.  Comfortable work of breathing. Good air movement. Cardiac: Regular rate and rhythm. Normal S1/S2.  No murmurs, rubs, nor gallops.   Abdomen: Soft without guarding. She has moderate left lower quadrant pain also with rebound tenderness. No discomfort in palpation of all other quadrants. Extremities: No peripheral edema.  Strong peripheral pulses.  Mental Status: No depression, anxiety, nor agitation. Skin: Warm and dry.  Assessment & Plan: Catherine Phillips was seen today for pain in left side.  Diagnoses and associated orders for this visit:  Dysuria - Urinalysis Dipstick - ciprofloxacin (CIPRO) 500 MG tablet; Take 1 tablet (500 mg total) by mouth 2 (two) times daily. - Urine culture  Diverticulitis of colon - ciprofloxacin (CIPRO) 500 MG tablet; Take 1 tablet (500 mg total) by mouth 2 (two) times daily. - metroNIDAZOLE (FLAGYL) 500 MG tablet; Take 1 tablet (500 mg total) by mouth 3 (three) times daily.    Dysuria: High suspicion for urinary tract infection, I've asked her to write [place] with a urine sample so we get a culture. She was unable to provide here in the clinic and  instead will provide a sample when she gets home. I've asked her to make sure she collects this prior to taking the antibiotics for diverticulitis. History and exam is suspicious for diverticulitis which she has had in the past. No red flags for imaging at this point but start Cipro and metronidazole both of which she reports no intolerance to in the past.Signs and symptoms requring emergent/urgent reevaluation were discussed with the patient.   Return if symptoms worsen or fail to improve.

## 2014-07-11 ENCOUNTER — Emergency Department (HOSPITAL_BASED_OUTPATIENT_CLINIC_OR_DEPARTMENT_OTHER)
Admission: EM | Admit: 2014-07-11 | Discharge: 2014-07-11 | Disposition: A | Discharge status: Home / Self Care | Payer: 59 | Attending: Emergency Medicine | Admitting: Emergency Medicine

## 2014-07-11 ENCOUNTER — Encounter (HOSPITAL_BASED_OUTPATIENT_CLINIC_OR_DEPARTMENT_OTHER): Payer: Self-pay | Admitting: Emergency Medicine

## 2014-07-11 ENCOUNTER — Emergency Department (HOSPITAL_BASED_OUTPATIENT_CLINIC_OR_DEPARTMENT_OTHER): Payer: 59

## 2014-07-11 DIAGNOSIS — Z87891 Personal history of nicotine dependence: Secondary | ICD-10-CM | POA: Diagnosis not present

## 2014-07-11 DIAGNOSIS — I10 Essential (primary) hypertension: Secondary | ICD-10-CM | POA: Insufficient documentation

## 2014-07-11 DIAGNOSIS — Z792 Long term (current) use of antibiotics: Secondary | ICD-10-CM | POA: Diagnosis not present

## 2014-07-11 DIAGNOSIS — N390 Urinary tract infection, site not specified: Secondary | ICD-10-CM | POA: Insufficient documentation

## 2014-07-11 DIAGNOSIS — R109 Unspecified abdominal pain: Secondary | ICD-10-CM

## 2014-07-11 DIAGNOSIS — J449 Chronic obstructive pulmonary disease, unspecified: Secondary | ICD-10-CM | POA: Insufficient documentation

## 2014-07-11 DIAGNOSIS — Z79899 Other long term (current) drug therapy: Secondary | ICD-10-CM | POA: Insufficient documentation

## 2014-07-11 DIAGNOSIS — F419 Anxiety disorder, unspecified: Secondary | ICD-10-CM | POA: Diagnosis not present

## 2014-07-11 DIAGNOSIS — Z7982 Long term (current) use of aspirin: Secondary | ICD-10-CM | POA: Insufficient documentation

## 2014-07-11 LAB — CBC WITH DIFFERENTIAL/PLATELET
BASOS PCT: 0 % (ref 0–1)
Basophils Absolute: 0.1 10*3/uL (ref 0.0–0.1)
EOS ABS: 0.4 10*3/uL (ref 0.0–0.7)
EOS PCT: 3 % (ref 0–5)
HCT: 44.5 % (ref 36.0–46.0)
HEMOGLOBIN: 15.3 g/dL — AB (ref 12.0–15.0)
Lymphocytes Relative: 24 % (ref 12–46)
Lymphs Abs: 2.9 10*3/uL (ref 0.7–4.0)
MCH: 31.7 pg (ref 26.0–34.0)
MCHC: 34.4 g/dL (ref 30.0–36.0)
MCV: 92.1 fL (ref 78.0–100.0)
MONOS PCT: 8 % (ref 3–12)
Monocytes Absolute: 1 10*3/uL (ref 0.1–1.0)
NEUTROS PCT: 65 % (ref 43–77)
Neutro Abs: 8.1 10*3/uL — ABNORMAL HIGH (ref 1.7–7.7)
PLATELETS: 363 10*3/uL (ref 150–400)
RBC: 4.83 MIL/uL (ref 3.87–5.11)
RDW: 12.7 % (ref 11.5–15.5)
WBC: 12.5 10*3/uL — ABNORMAL HIGH (ref 4.0–10.5)

## 2014-07-11 LAB — COMPREHENSIVE METABOLIC PANEL
ALBUMIN: 4.2 g/dL (ref 3.5–5.2)
ALT: <5 U/L (ref 0–35)
ANION GAP: 17 — AB (ref 5–15)
AST: 18 U/L (ref 0–37)
Alkaline Phosphatase: 53 U/L (ref 39–117)
BUN: 19 mg/dL (ref 6–23)
CALCIUM: 10.3 mg/dL (ref 8.4–10.5)
CO2: 24 mEq/L (ref 19–32)
Chloride: 99 mEq/L (ref 96–112)
Creatinine, Ser: 0.9 mg/dL (ref 0.50–1.10)
GFR calc non Af Amer: 64 mL/min — ABNORMAL LOW (ref >90)
GFR, EST AFRICAN AMERICAN: 74 mL/min — AB (ref >90)
GLUCOSE: 91 mg/dL (ref 70–99)
Potassium: 3.8 mEq/L (ref 3.7–5.3)
Sodium: 140 mEq/L (ref 137–147)
TOTAL PROTEIN: 7.7 g/dL (ref 6.0–8.3)
Total Bilirubin: 0.4 mg/dL (ref 0.3–1.2)

## 2014-07-11 LAB — LIPASE, BLOOD: Lipase: 27 U/L (ref 11–59)

## 2014-07-11 MED ORDER — ONDANSETRON HCL 4 MG/2ML IJ SOLN
4.0000 mg | Freq: Once | INTRAMUSCULAR | Status: AC
  Administered 2014-07-11: 4 mg via INTRAVENOUS
  Filled 2014-07-11: qty 2

## 2014-07-11 MED ORDER — SODIUM CHLORIDE 0.9 % IV BOLUS (SEPSIS)
500.0000 mL | Freq: Once | INTRAVENOUS | Status: AC
  Administered 2014-07-11: 500 mL via INTRAVENOUS

## 2014-07-11 MED ORDER — SODIUM CHLORIDE 0.9 % IV SOLN: Freq: Once | INTRAVENOUS | Status: DC

## 2014-07-11 MED ORDER — MORPHINE SULFATE 4 MG/ML IJ SOLN
4.0000 mg | INTRAMUSCULAR | Status: DC | PRN
  Administered 2014-07-11: 2 mg via INTRAVENOUS
  Filled 2014-07-11: qty 1

## 2014-07-11 NOTE — ED Provider Notes (Signed)
CSN: [id number]     Arrival date & time 07/11/14  1239 History   First MD Initiated Contact with Patient 07/11/14 1255     Chief Complaint  Patient presents with  . Abdominal Pain      HPI  Is presents for evaluation of left-sided abdominal pain. Saw her doctor on Thursday after 24 hours of dysuria. Started developing some left-sided abdominal pain yesterday. Given Cipro and Flagyl. States she was told by a physician she had urinary tract infection but there was concern for diverticulitis. Presents here today because her pain is no better. Not worse. No fever no nausea vomiting. Normal bowel habits today.  Past Medical History  Diagnosis Date  . COPD (chronic obstructive pulmonary disease)   . Hypertension   . Emphysema of lung   . Anxiety 06/24/2012  . Essential hypertension 06/24/2012  . Emphysema 06/24/2012  . History of Left leg claudication 06/24/2012   Past Surgical History  Procedure Laterality Date  . Abdominal aortagram  12/09/12   Family History  Problem Relation Age of Onset  . Cancer Mother   . Heart attack Father   . Deep vein thrombosis Father   . Hyperlipidemia Father   . Hypertension Father    History  Substance Use Topics  . Smoking status: Former Smoker -- 1.50 packs/day for 30 years    Types: Cigarettes    Quit date: 12/30/2009  . Smokeless tobacco: Never Used  . Alcohol Use: No   OB History   Grav Para Term Preterm Abortions TAB SAB Ect Mult Living                 Review of Systems  Constitutional: Negative for fever, chills, diaphoresis, appetite change and fatigue.  HENT: Negative for mouth sores, sore throat and trouble swallowing.   Eyes: Negative for visual disturbance.  Respiratory: Negative for cough, chest tightness, shortness of breath and wheezing.   Cardiovascular: Negative for chest pain.  Gastrointestinal: Positive for abdominal pain. Negative for nausea, vomiting, diarrhea and abdominal distention.  Endocrine: Negative for  polydipsia, polyphagia and polyuria.  Genitourinary: Positive for dysuria, urgency and frequency. Negative for hematuria.  Musculoskeletal: Negative for gait problem.  Skin: Negative for color change, pallor and rash.  Neurological: Negative for dizziness, syncope, light-headedness and headaches.  Hematological: Does not bruise/bleed easily.  Psychiatric/Behavioral: Negative for behavioral problems and confusion.      Allergies  Codeine and Levaquin  Home Medications   Prior to Admission medications   Medication Sig Start Date End Date Taking? Authorizing Provider  albuterol (PROVENTIL HFA;VENTOLIN HFA) 108 (90 BASE) MCG/ACT inhaler Inhale two puffs every 4-6 hours only as needed for shortness of breath or wheezing. 04/21/14 04/21/15  Sean Hommel, DO  albuterol (PROVENTIL) (2.5 MG/3ML) 0.083% nebulizer solution Take 3 mLs (2.5 mg total) by nebulization every 4 (four) hours as needed for wheezing or shortness of breath. 04/21/14   Sean Hommel, DO  amLODipine (NORVASC) 10 MG tablet TAKE 1 TABLET EVERY DAY    Sean Hommel, DO  aspirin EC 325 MG tablet Take 1 tablet (325 mg total) by mouth daily. 11/04/12   Marcial Pacas, DO  calcium carbonate (TUMS) 500 MG chewable tablet Chew 1 tablet by mouth daily as needed for heartburn.    Historical Provider, MD  Calcium Carbonate-Vitamin D (CALCIUM-VITAMIN D) 500-200 MG-UNIT per tablet Take 1 tablet by mouth daily.    Historical Provider, MD  ciprofloxacin (CIPRO) 500 MG tablet Take 1 tablet (500 mg total) by mouth  2 (two) times daily. 07/09/14   Sean Hommel, DO  esomeprazole (NEXIUM) 40 MG capsule Take 1 capsule (40 mg total) by mouth daily. 02/24/13   Sean Hommel, DO  hydrochlorothiazide (HYDRODIURIL) 25 MG tablet TAKE 1 TABLET EVERY DAY 10/24/13   Marcial Pacas, DO  metroNIDAZOLE (FLAGYL) 500 MG tablet Take 1 tablet (500 mg total) by mouth 3 (three) times daily. 07/09/14   Sean Hommel, DO  nystatin (MYCOSTATIN) 100000 UNIT/ML suspension Take 5 mLs (500,000 Units  total) by mouth 4 (four) times daily. Use for 3-4 days after symptoms resolve. 04/28/14   Marcial Pacas, DO  Pitavastatin Calcium 2 MG TABS Take 1 tablet (2 mg total) by mouth daily. 04/22/14   Sean Hommel, DO  pregabalin (LYRICA) 50 MG capsule Take 1 capsule (50 mg total) by mouth 3 (three) times daily. 06/22/14   Sean Hommel, DO  tiotropium (SPIRIVA) 18 MCG inhalation capsule Place 1 capsule (18 mcg total) into inhaler and inhale daily. 04/21/14   Sean Hommel, DO   BP 135/52  Pulse 68  Temp(Src) 97.8 F (36.6 C) (Oral)  Resp 18  Wt 108 lb (48.988 kg)  SpO2 85% Physical Exam  Constitutional: She is oriented to person, place, and time. She appears well-developed and well-nourished. No distress.  HENT:  Head: Normocephalic.  Eyes: Conjunctivae are normal. Pupils are equal, round, and reactive to light. No scleral icterus.  Neck: Normal range of motion. Neck supple. No thyromegaly present.  Cardiovascular: Normal rate and regular rhythm.  Exam reveals no gallop and no friction rub.   No murmur heard. Pulmonary/Chest: Effort normal and breath sounds normal. No respiratory distress. She has no wheezes. She has no rales.  Abdominal: Soft. Bowel sounds are normal. She exhibits no distension. There is no tenderness. There is no rebound.    Musculoskeletal: Normal range of motion.       Back:  Neurological: She is alert and oriented to person, place, and time.  Skin: Skin is warm and dry. No rash noted.  Psychiatric: She has a normal mood and affect. Her behavior is normal.    ED Course  Procedures (including critical care time) Labs Review Labs Reviewed  CBC WITH DIFFERENTIAL - Abnormal; Notable for the following:    WBC 12.5 (*)    Hemoglobin 15.3 (*)    Neutro Abs 8.1 (*)    All other components within normal limits  COMPREHENSIVE METABOLIC PANEL - Abnormal; Notable for the following:    GFR calc non Af Amer 64 (*)    GFR calc Af Amer 74 (*)    Anion gap 17 (*)    All other components  within normal limits  LIPASE, BLOOD    Imaging Review Ct Abdomen Pelvis Wo Contrast  07/11/2014   CLINICAL DATA:  Left-sided abdominal and back pain. Question perforated diverticulum.  EXAM: CT ABDOMEN AND PELVIS WITHOUT CONTRAST  TECHNIQUE: Multidetector CT imaging of the abdomen and pelvis was performed following the standard protocol without IV contrast.  COMPARISON:  CT chest 04/28/2014  FINDINGS: There are emphysematous changes of the lung bases. No airspace consolidation or pleural effusion.  Noncontrast appearance of the liver, gallbladder, spleen, adrenal glands, and pancreas is within normal limits. 2 mm focal calcification associated with the cortex of the lower pole of the right kidney. This is favored to be a cortical calcification rather than a stone, given its position. There is no hydronephrosis on the right. The right ureter is normal in caliber. Negative for right ureteral stone.  There are two approximately 2 mm calcifications associated with the left kidney, one in the superior pole and one in the midpole. These are likely tiny nonobstructing stones. There is no hydronephrosis on the left. The left ureter is normal in caliber. No ureteral stones are seen on the left.  Urinary bladder is not very distended in appears within normal limits.  There is fairly heavy atherosclerotic calcification of the normal caliber abdominal aorta and of the iliac vasculature bilaterally. There is heavy atherosclerotic calcification at the origin of the superior mesenteric artery and in the more distal superior mesenteric artery and there is atherosclerotic calcification at the origin of the renal arteries, left greater than right.  The stomach is decompressed. Small bowel loops are normal in caliber. No bowel wall thickening is appreciated. The appendix is normal. The colon is normal in caliber. Patient has multiple colonic diverticula, most prominent in the sigmoid colon. No discrete stranding or fluid is  seen adjacent to the diverticula to suggest acute diverticulitis. There is no extraluminal gas or pneumoperitoneum.  The uterus is present and unremarkable. No adnexal mass. Negative for ascites. Negative for lymphadenopathy.  There is grade 1 anterolisthesis of L5 on S1, likely due to facet joint degenerative change.  IMPRESSION: 1. No definite acute findings in the abdomen or pelvis to explain the patient's pain. 2. Colonic diverticulosis, most prominent in the sigmoid region. There are no findings on CT to suggest acute diverticulitis or ruptured diverticulum. 3. 2 2 mm calcifications in the left kidney are likely nonobstructing renal calculi. Alternatively, they could be tiny cortical calcifications. Negative for urinary tract obstruction. 4. Tiny cortical calcification of the right kidney. No urinary tract obstruction on the right. 5. Heavy atherosclerotic calcification of the abdominal aorta and iliac vasculature, and areas of atherosclerotic calcification involving the aortic branch vessels.   Electronically Signed   By: Curlene Dolphin M.D.   On: 07/11/2014 14:27     EKG Interpretation None      MDM   Final diagnoses:  Abdominal pain  UTI (lower urinary tract infection)      CT shows diverticuli. No signs of acute abdominal infection or diverticulitis. She'll stop her Flagyl. Continue her Cipro for her recently diagnosed UTI. Declines prescription for pain medicine at home. Recheck acute changes and routine primary care follow-up.  Tanna Furry, MD 07/11/14 [phone]

## 2014-07-11 NOTE — ED Notes (Signed)
Patient here with general abdominal pain that is increasing with diarrhea since Thursday. Saw her MD on Thursday and started antibiotics for UTI and bacterial infection

## 2014-07-11 NOTE — Discharge Instructions (Signed)
Abdominal Pain Many things can cause abdominal pain. Usually, abdominal pain is not caused by a disease and will improve without treatment. It can often be observed and treated at home. Your health care provider will do a physical exam and possibly order blood tests and X-rays to help determine the seriousness of your pain. However, in many cases, more time must pass before a clear cause of the pain can be found. Before that point, your health care provider may not know if you need more testing or further treatment. HOME CARE INSTRUCTIONS  Monitor your abdominal pain for any changes. The following actions may help to alleviate any discomfort you are experiencing:  Only take over-the-counter or prescription medicines as directed by your health care provider.  Do not take laxatives unless directed to do so by your health care provider.  Try a clear liquid diet (broth, tea, or water) as directed by your health care provider. Slowly move to a bland diet as tolerated. SEEK MEDICAL CARE IF:  You have unexplained abdominal pain.  You have abdominal pain associated with nausea or diarrhea.  You have pain when you urinate or have a bowel movement.  You experience abdominal pain that wakes you in the night.  You have abdominal pain that is worsened or improved by eating food.  You have abdominal pain that is worsened with eating fatty foods.  You have a fever. SEEK IMMEDIATE MEDICAL CARE IF:   Your pain does not go away within 2 hours.  You keep throwing up (vomiting).  Your pain is felt only in portions of the abdomen, such as the right side or the left lower portion of the abdomen.  You pass bloody or black tarry stools. MAKE SURE YOU:  Understand these instructions.  Urinary Tract Infection A urinary tract infection (UTI) can occur any place along the urinary tract. The tract includes the kidneys, ureters, bladder, and urethra. A type of germ called bacteria often causes a UTI. UTIs  are often helped with antibiotic medicine.  HOME CARE  If given, take antibiotics as told by your doctor. Finish them even if you start to feel better. Drink enough fluids to keep your pee (urine) clear or pale yellow. Avoid tea, drinks with caffeine, and bubbly (carbonated) drinks. Pee often. Avoid holding your pee in for a long time. Pee before and after having sex (intercourse). Wipe from front to back after you poop (bowel movement) if you are a woman. Use each tissue only once. GET HELP RIGHT AWAY IF:  You have back pain. You have lower belly (abdominal) pain. You have chills. You feel sick to your stomach (nauseous). You throw up (vomit). Your burning or discomfort with peeing does not go away. You have a fever. Your symptoms are not better in 3 days. MAKE SURE YOU:  Understand these instructions. Will watch your condition. Will get help right away if you are not doing well or get worse. Document Released: 02/21/2008 Document Revised: 05/29/2012 Document Reviewed: 04/04/2012 [facility] Patient Information 2015 [place], [place]. This information is not intended to replace advice given to you by your health care provider. Make sure you discuss any questions you have with your health care provider.   Will watch your condition.   Will get help right away if you are not doing well or get worse.  Document Released: 06/14/2005 Document Revised: 09/09/2013 Document Reviewed: 05/14/2013 [facility] Patient Information 2015 [place], [place]. This information is not intended to replace advice given to you by your health  care provider. Make sure you discuss any questions you have with your health care provider.

## 2014-07-12 LAB — URINE CULTURE

## 2014-07-16 ENCOUNTER — Telehealth: Payer: Self-pay | Admitting: *Deleted

## 2014-07-16 NOTE — Telephone Encounter (Signed)
Husband calls that Catherine Phillips is still experiencing a lot of abdominal pain and she was in the ER on 07/11/14. He said that she was doubled over in pain and is unable to eat or keep anything down. I told him that I suggested she go back to the ER and be reevaluated that I did not have appts available and if she was experiencing this much pain that is where she needed to be. She did have an abnormal CT scan last week. Margette Fast, RMA

## 2014-07-16 NOTE — Telephone Encounter (Signed)
Agree needs to go to ED

## 2014-07-23 ENCOUNTER — Other Ambulatory Visit: Payer: Self-pay | Admitting: Family Medicine

## 2014-08-09 ENCOUNTER — Encounter (HOSPITAL_BASED_OUTPATIENT_CLINIC_OR_DEPARTMENT_OTHER): Payer: Self-pay

## 2014-08-09 ENCOUNTER — Emergency Department (HOSPITAL_BASED_OUTPATIENT_CLINIC_OR_DEPARTMENT_OTHER): Payer: 59

## 2014-08-09 ENCOUNTER — Inpatient Hospital Stay (HOSPITAL_BASED_OUTPATIENT_CLINIC_OR_DEPARTMENT_OTHER)
Admission: EM | Admit: 2014-08-09 | Discharge: 2014-08-11 | DRG: 193 | Disposition: A | Discharge status: Home / Self Care | Payer: 59 | Attending: Internal Medicine | Admitting: Internal Medicine

## 2014-08-09 DIAGNOSIS — J849 Interstitial pulmonary disease, unspecified: Secondary | ICD-10-CM | POA: Diagnosis present

## 2014-08-09 DIAGNOSIS — R918 Other nonspecific abnormal finding of lung field: Secondary | ICD-10-CM | POA: Diagnosis present

## 2014-08-09 DIAGNOSIS — G629 Polyneuropathy, unspecified: Secondary | ICD-10-CM

## 2014-08-09 DIAGNOSIS — Z9981 Dependence on supplemental oxygen: Secondary | ICD-10-CM

## 2014-08-09 DIAGNOSIS — J189 Pneumonia, unspecified organism: Principal | ICD-10-CM

## 2014-08-09 DIAGNOSIS — M810 Age-related osteoporosis without current pathological fracture: Secondary | ICD-10-CM | POA: Diagnosis present

## 2014-08-09 DIAGNOSIS — K219 Gastro-esophageal reflux disease without esophagitis: Secondary | ICD-10-CM | POA: Diagnosis present

## 2014-08-09 DIAGNOSIS — T380X5A Adverse effect of glucocorticoids and synthetic analogues, initial encounter: Secondary | ICD-10-CM | POA: Diagnosis present

## 2014-08-09 DIAGNOSIS — Z881 Allergy status to other antibiotic agents status: Secondary | ICD-10-CM

## 2014-08-09 DIAGNOSIS — Z87891 Personal history of nicotine dependence: Secondary | ICD-10-CM | POA: Diagnosis not present

## 2014-08-09 DIAGNOSIS — R911 Solitary pulmonary nodule: Secondary | ICD-10-CM | POA: Diagnosis present

## 2014-08-09 DIAGNOSIS — N179 Acute kidney failure, unspecified: Secondary | ICD-10-CM | POA: Diagnosis present

## 2014-08-09 DIAGNOSIS — J441 Chronic obstructive pulmonary disease with (acute) exacerbation: Secondary | ICD-10-CM | POA: Diagnosis present

## 2014-08-09 DIAGNOSIS — I10 Essential (primary) hypertension: Secondary | ICD-10-CM

## 2014-08-09 DIAGNOSIS — R0602 Shortness of breath: Secondary | ICD-10-CM

## 2014-08-09 DIAGNOSIS — I739 Peripheral vascular disease, unspecified: Secondary | ICD-10-CM | POA: Diagnosis present

## 2014-08-09 DIAGNOSIS — E785 Hyperlipidemia, unspecified: Secondary | ICD-10-CM

## 2014-08-09 DIAGNOSIS — J9621 Acute and chronic respiratory failure with hypoxia: Secondary | ICD-10-CM | POA: Diagnosis present

## 2014-08-09 DIAGNOSIS — J440 Chronic obstructive pulmonary disease with acute lower respiratory infection: Secondary | ICD-10-CM | POA: Diagnosis present

## 2014-08-09 DIAGNOSIS — Z7982 Long term (current) use of aspirin: Secondary | ICD-10-CM

## 2014-08-09 DIAGNOSIS — Z885 Allergy status to narcotic agent status: Secondary | ICD-10-CM | POA: Diagnosis not present

## 2014-08-09 DIAGNOSIS — F419 Anxiety disorder, unspecified: Secondary | ICD-10-CM | POA: Diagnosis present

## 2014-08-09 HISTORY — DX: Reserved for inherently not codable concepts without codable children: IMO0001

## 2014-08-09 LAB — URINALYSIS, ROUTINE W REFLEX MICROSCOPIC
Bilirubin Urine: NEGATIVE
Glucose, UA: NEGATIVE mg/dL
HGB URINE DIPSTICK: NEGATIVE
Ketones, ur: NEGATIVE mg/dL
Leukocytes, UA: NEGATIVE
Nitrite: NEGATIVE
PROTEIN: NEGATIVE mg/dL
Specific Gravity, Urine: 1.011 (ref 1.005–1.030)
UROBILINOGEN UA: 0.2 mg/dL (ref 0.0–1.0)
pH: 5.5 (ref 5.0–8.0)

## 2014-08-09 LAB — BASIC METABOLIC PANEL
ANION GAP: 13 (ref 5–15)
BUN: 25 mg/dL — ABNORMAL HIGH (ref 6–23)
CHLORIDE: 103 meq/L (ref 96–112)
CO2: 27 meq/L (ref 19–32)
Calcium: 10.1 mg/dL (ref 8.4–10.5)
Creatinine, Ser: 1.2 mg/dL — ABNORMAL HIGH (ref 0.50–1.10)
GFR calc Af Amer: 53 mL/min — ABNORMAL LOW (ref >90)
GFR calc non Af Amer: 45 mL/min — ABNORMAL LOW (ref >90)
Glucose, Bld: 113 mg/dL — ABNORMAL HIGH (ref 70–99)
POTASSIUM: 4.7 meq/L (ref 3.7–5.3)
SODIUM: 143 meq/L (ref 137–147)

## 2014-08-09 LAB — TROPONIN I

## 2014-08-09 LAB — CREATININE, SERUM
Creatinine, Ser: 1.07 mg/dL (ref 0.50–1.10)
GFR calc Af Amer: 60 mL/min — ABNORMAL LOW (ref >90)
GFR calc non Af Amer: 52 mL/min — ABNORMAL LOW (ref >90)

## 2014-08-09 LAB — CBC WITH DIFFERENTIAL/PLATELET
Basophils Absolute: 0 10*3/uL (ref 0.0–0.1)
Basophils Relative: 0 % (ref 0–1)
EOS ABS: 4.3 10*3/uL — AB (ref 0.0–0.7)
Eosinophils Relative: 17 % — ABNORMAL HIGH (ref 0–5)
HCT: 45 % (ref 36.0–46.0)
HEMOGLOBIN: 14.9 g/dL (ref 12.0–15.0)
LYMPHS ABS: 1.4 10*3/uL (ref 0.7–4.0)
LYMPHS PCT: 6 % — AB (ref 12–46)
MCH: 31.6 pg (ref 26.0–34.0)
MCHC: 33.1 g/dL (ref 30.0–36.0)
MCV: 95.3 fL (ref 78.0–100.0)
Monocytes Absolute: 1.1 10*3/uL — ABNORMAL HIGH (ref 0.1–1.0)
Monocytes Relative: 4 % (ref 3–12)
NEUTROS PCT: 73 % (ref 43–77)
Neutro Abs: 18.1 10*3/uL — ABNORMAL HIGH (ref 1.7–7.7)
Platelets: 353 10*3/uL (ref 150–400)
RBC: 4.72 MIL/uL (ref 3.87–5.11)
RDW: 13.6 % (ref 11.5–15.5)
WBC: 24.8 10*3/uL — ABNORMAL HIGH (ref 4.0–10.5)

## 2014-08-09 LAB — CBC
HEMATOCRIT: 39.7 % (ref 36.0–46.0)
HEMOGLOBIN: 13.2 g/dL (ref 12.0–15.0)
MCH: 31.8 pg (ref 26.0–34.0)
MCHC: 33.2 g/dL (ref 30.0–36.0)
MCV: 95.7 fL (ref 78.0–100.0)
Platelets: 333 10*3/uL (ref 150–400)
RBC: 4.15 MIL/uL (ref 3.87–5.11)
RDW: 13.5 % (ref 11.5–15.5)
WBC: 14.7 10*3/uL — ABNORMAL HIGH (ref 4.0–10.5)

## 2014-08-09 LAB — OSMOLALITY: Osmolality: 297 mOsm/kg (ref 275–300)

## 2014-08-09 LAB — PRO B NATRIURETIC PEPTIDE: PRO B NATRI PEPTIDE: 431 pg/mL — AB (ref 0–125)

## 2014-08-09 MED ORDER — AMLODIPINE BESYLATE 10 MG PO TABS
10.0000 mg | ORAL_TABLET | Freq: Every day | ORAL | Status: DC
  Administered 2014-08-10 – 2014-08-11 (×2): 10 mg via ORAL
  Filled 2014-08-09 (×3): qty 1

## 2014-08-09 MED ORDER — ONDANSETRON HCL 4 MG/2ML IJ SOLN: 4.0000 mg | Freq: Four times a day (QID) | INTRAMUSCULAR | Status: DC | PRN

## 2014-08-09 MED ORDER — DIPHENHYDRAMINE HCL 25 MG PO CAPS
25.0000 mg | ORAL_CAPSULE | Freq: Once | ORAL | Status: AC
  Administered 2014-08-09: 25 mg via ORAL
  Filled 2014-08-09: qty 1

## 2014-08-09 MED ORDER — ALBUTEROL SULFATE (2.5 MG/3ML) 0.083% IN NEBU
2.5000 mg | INHALATION_SOLUTION | Freq: Once | RESPIRATORY_TRACT | Status: AC
  Administered 2014-08-09: 2.5 mg via RESPIRATORY_TRACT
  Filled 2014-08-09: qty 3

## 2014-08-09 MED ORDER — DEXTROSE 5 % IV SOLN
500.0000 mg | INTRAVENOUS | Status: DC
  Administered 2014-08-10 – 2014-08-11 (×2): 500 mg via INTRAVENOUS
  Filled 2014-08-09 (×2): qty 500

## 2014-08-09 MED ORDER — PREDNISONE 50 MG PO TABS
60.0000 mg | ORAL_TABLET | Freq: Once | ORAL | Status: AC
  Administered 2014-08-09: 60 mg via ORAL
  Filled 2014-08-09 (×2): qty 1

## 2014-08-09 MED ORDER — CALCIUM-VITAMIN D 500-200 MG-UNIT PO TABS: 1.0000 | ORAL_TABLET | Freq: Every day | ORAL | Status: DC

## 2014-08-09 MED ORDER — CEFTRIAXONE SODIUM IN DEXTROSE 20 MG/ML IV SOLN
1.0000 g | INTRAVENOUS | Status: DC
  Administered 2014-08-10 – 2014-08-11 (×2): 1 g via INTRAVENOUS
  Filled 2014-08-09 (×3): qty 50

## 2014-08-09 MED ORDER — ONDANSETRON HCL 4 MG PO TABS
4.0000 mg | ORAL_TABLET | Freq: Four times a day (QID) | ORAL | Status: DC | PRN
  Filled 2014-08-09 (×2): qty 1

## 2014-08-09 MED ORDER — ALBUTEROL SULFATE (2.5 MG/3ML) 0.083% IN NEBU
2.5000 mg | INHALATION_SOLUTION | Freq: Four times a day (QID) | RESPIRATORY_TRACT | Status: DC
  Administered 2014-08-09 – 2014-08-10 (×3): 2.5 mg via RESPIRATORY_TRACT
  Filled 2014-08-09 (×3): qty 3

## 2014-08-09 MED ORDER — ASPIRIN EC 325 MG PO TBEC
325.0000 mg | DELAYED_RELEASE_TABLET | Freq: Every day | ORAL | Status: DC
  Administered 2014-08-09 – 2014-08-11 (×3): 325 mg via ORAL
  Filled 2014-08-09 (×3): qty 1

## 2014-08-09 MED ORDER — POLYETHYLENE GLYCOL 3350 17 G PO PACK: 17.0000 g | PACK | Freq: Every day | ORAL | Status: DC | PRN

## 2014-08-09 MED ORDER — GUAIFENESIN-DM 100-10 MG/5ML PO SYRP: 5.0000 mL | ORAL_SOLUTION | ORAL | Status: DC | PRN

## 2014-08-09 MED ORDER — SODIUM CHLORIDE 0.9 % IV SOLN
INTRAVENOUS | Status: DC
  Administered 2014-08-09 – 2014-08-10 (×2): via INTRAVENOUS

## 2014-08-09 MED ORDER — NYSTATIN 100000 UNIT/ML MT SUSP
5.0000 mL | Freq: Four times a day (QID) | OROMUCOSAL | Status: DC
  Administered 2014-08-09 – 2014-08-11 (×6): 500000 [IU] via ORAL
  Filled 2014-08-09 (×11): qty 5

## 2014-08-09 MED ORDER — IPRATROPIUM-ALBUTEROL 0.5-2.5 (3) MG/3ML IN SOLN
3.0000 mL | Freq: Once | RESPIRATORY_TRACT | Status: AC
  Administered 2014-08-09: 3 mL via RESPIRATORY_TRACT
  Filled 2014-08-09: qty 3

## 2014-08-09 MED ORDER — PANTOPRAZOLE SODIUM 40 MG PO TBEC
40.0000 mg | DELAYED_RELEASE_TABLET | Freq: Every day | ORAL | Status: DC
  Administered 2014-08-09 – 2014-08-11 (×3): 40 mg via ORAL
  Filled 2014-08-09 (×3): qty 1

## 2014-08-09 MED ORDER — CALCIUM CARBONATE-VITAMIN D 500-200 MG-UNIT PO TABS
1.0000 | ORAL_TABLET | Freq: Every day | ORAL | Status: DC
  Administered 2014-08-11: 1 via ORAL
  Filled 2014-08-09 (×3): qty 1

## 2014-08-09 MED ORDER — METHYLPREDNISOLONE SODIUM SUCC 125 MG IJ SOLR
60.0000 mg | Freq: Two times a day (BID) | INTRAMUSCULAR | Status: DC
  Administered 2014-08-09 – 2014-08-10 (×2): 60 mg via INTRAVENOUS
  Filled 2014-08-09: qty 2
  Filled 2014-08-09 (×4): qty 0.96

## 2014-08-09 MED ORDER — PREGABALIN 50 MG PO CAPS
50.0000 mg | ORAL_CAPSULE | Freq: Three times a day (TID) | ORAL | Status: DC
  Administered 2014-08-09 – 2014-08-11 (×5): 50 mg via ORAL
  Filled 2014-08-09 (×5): qty 1

## 2014-08-09 MED ORDER — CEFTRIAXONE SODIUM 1 G IJ SOLR
INTRAMUSCULAR | Status: AC
  Filled 2014-08-09: qty 10

## 2014-08-09 MED ORDER — ALBUTEROL SULFATE (2.5 MG/3ML) 0.083% IN NEBU
5.0000 mg | INHALATION_SOLUTION | Freq: Once | RESPIRATORY_TRACT | Status: AC
  Administered 2014-08-09: 5 mg via RESPIRATORY_TRACT
  Filled 2014-08-09: qty 6

## 2014-08-09 MED ORDER — DEXTROSE 5 % IV SOLN
1.0000 g | Freq: Once | INTRAVENOUS | Status: AC
  Administered 2014-08-09: 1 g via INTRAVENOUS

## 2014-08-09 MED ORDER — HYDROCHLOROTHIAZIDE 25 MG PO TABS
25.0000 mg | ORAL_TABLET | Freq: Every day | ORAL | Status: DC
  Administered 2014-08-10 – 2014-08-11 (×2): 25 mg via ORAL
  Filled 2014-08-09 (×2): qty 1

## 2014-08-09 MED ORDER — HYDROCODONE-ACETAMINOPHEN 5-325 MG PO TABS: 1.0000 | ORAL_TABLET | ORAL | Status: DC | PRN

## 2014-08-09 MED ORDER — AZITHROMYCIN 250 MG PO TABS
500.0000 mg | ORAL_TABLET | Freq: Once | ORAL | Status: AC
  Administered 2014-08-09: 500 mg via ORAL
  Filled 2014-08-09: qty 2

## 2014-08-09 MED ORDER — ALUM & MAG HYDROXIDE-SIMETH 200-200-20 MG/5ML PO SUSP: 30.0000 mL | Freq: Four times a day (QID) | ORAL | Status: DC | PRN

## 2014-08-09 MED ORDER — HEPARIN SODIUM (PORCINE) 5000 UNIT/ML IJ SOLN
5000.0000 [IU] | Freq: Three times a day (TID) | INTRAMUSCULAR | Status: DC
  Administered 2014-08-09 – 2014-08-11 (×6): 5000 [IU] via SUBCUTANEOUS
  Filled 2014-08-09 (×6): qty 1

## 2014-08-09 MED ORDER — TIOTROPIUM BROMIDE MONOHYDRATE 18 MCG IN CAPS
18.0000 ug | ORAL_CAPSULE | Freq: Every day | RESPIRATORY_TRACT | Status: DC
  Administered 2014-08-10: 18 ug via RESPIRATORY_TRACT
  Filled 2014-08-09: qty 5

## 2014-08-09 MED ORDER — CALCIUM CARBONATE ANTACID 500 MG PO CHEW: 1.0000 | CHEWABLE_TABLET | Freq: Every day | ORAL | Status: DC | PRN

## 2014-08-09 MED ORDER — TIOTROPIUM BROMIDE MONOHYDRATE 18 MCG IN CAPS
18.0000 ug | ORAL_CAPSULE | Freq: Every day | RESPIRATORY_TRACT | Status: DC
  Administered 2014-08-11: 18 ug via RESPIRATORY_TRACT
  Filled 2014-08-09: qty 5

## 2014-08-09 MED ORDER — PRAVASTATIN SODIUM 20 MG PO TABS
20.0000 mg | ORAL_TABLET | Freq: Every day | ORAL | Status: DC
  Administered 2014-08-10: 20 mg via ORAL
  Filled 2014-08-09 (×3): qty 1

## 2014-08-09 NOTE — H&P (Signed)
Patient Demographics  Catherine Phillips, is a 68 y.o. female  MRN: [id number]   DOB - 01-Oct-1945  Admit Date - 08/09/2014  Outpatient Primary MD for the patient is Marcial Pacas, DO   With History of -  Past Medical History  Diagnosis Date  . COPD (chronic obstructive pulmonary disease)   . Hypertension   . Emphysema of lung   . Anxiety 06/24/2012  . Essential hypertension 06/24/2012  . Emphysema 06/24/2012  . History of Left leg claudication 06/24/2012      Past Surgical History  Procedure Laterality Date  . Abdominal aortagram  12/09/12    in for   Chief Complaint  Patient presents with  . Shortness of Breath     HPI  Catherine Phillips  is a 68 y.o. female, with history of advanced COPD with predominantly emphysema, essential hypertension, history of lung claudication, remote history of smoking quit 4 years ago, osteoporosis, GERD presents to the hospital with 24-36 hour history of dry cough along with shortness of breath, denies any orthopnea, no history of travel, no chest pain or palpitations, no fevers, no swelling in legs. Presented to [facility] where she was diagnosed with community acquired pneumonia along with COPD exacerbation and then transferred here for further care.    Review of Systems    In addition to the HPI above,   No Fever-chills, No Headache, No changes with Vision or hearing, No problems swallowing food or Liquids, No Chest pain, Cough but +ve Shortness of Breath, No Abdominal pain, No Nausea or Vommitting, Bowel movements are regular, No Blood in stool or Urine, No dysuria, No new skin rashes or bruises, No new joints pains-aches,  No new weakness, tingling, numbness in any extremity, No recent weight gain or loss, No polyuria, polydypsia or polyphagia, No  significant Mental Stressors.  A full 10 point Review of Systems was done, except as stated above, all other Review of Systems were negative.   Social History History  Substance Use Topics  . Smoking status: Former Smoker -- 1.50 packs/day for 30 years    Types: Cigarettes    Quit date: 12/30/2009  . Smokeless tobacco: Never Used  . Alcohol Use: No      Family History Family History  Problem Relation Age of Onset  . Cancer Mother   . Heart attack Father   . Deep vein thrombosis Father   . Hyperlipidemia Father   . Hypertension Father       Prior to Admission medications   Medication Sig Start Date End Date Taking? Authorizing Provider  albuterol (PROVENTIL) (2.5 MG/3ML) 0.083% nebulizer solution Take 3 mLs (2.5 mg total) by nebulization every 4 (four) hours as needed for wheezing or shortness of breath. 04/21/14   Sean Hommel, DO  amLODipine (NORVASC) 10 MG tablet TAKE 1 TABLET EVERY DAY 07/23/14   Marcial Pacas, DO  aspirin EC 325 MG tablet Take 1 tablet (325 mg total)  by mouth daily. 11/04/12   Marcial Pacas, DO  calcium carbonate (TUMS) 500 MG chewable tablet Chew 1 tablet by mouth daily as needed for heartburn.    Historical Provider, MD  Calcium Carbonate-Vitamin D (CALCIUM-VITAMIN D) 500-200 MG-UNIT per tablet Take 1 tablet by mouth daily.    Historical Provider, MD  esomeprazole (NEXIUM) 40 MG capsule Take 1 capsule (40 mg total) by mouth daily. 02/24/13   Sean Hommel, DO  hydrochlorothiazide (HYDRODIURIL) 25 MG tablet TAKE 1 TABLET EVERY DAY 10/24/13   Marcial Pacas, DO  nystatin (MYCOSTATIN) 100000 UNIT/ML suspension Take 5 mLs (500,000 Units total) by mouth 4 (four) times daily. Use for 3-4 days after symptoms resolve. 04/28/14   Marcial Pacas, DO  Pitavastatin Calcium 2 MG TABS Take 1 tablet (2 mg total) by mouth daily. 04/22/14   Sean Hommel, DO  pregabalin (LYRICA) 50 MG capsule Take 1 capsule (50 mg total) by mouth 3 (three) times daily. 06/22/14   Sean Hommel, DO  tiotropium  (SPIRIVA) 18 MCG inhalation capsule Place 1 capsule (18 mcg total) into inhaler and inhale daily. 04/21/14   Marcial Pacas, DO    Allergies  Allergen Reactions  . Codeine Nausea And Vomiting  . Levaquin [Levofloxacin In D5w] Itching and Rash    Physical Exam  Vitals  Blood pressure 134/65, pulse 98, temperature 98.3 F (36.8 C), temperature source Oral, resp. rate 18, height 5\' 2"  (1.575 m), weight 48.988 kg (108 lb), SpO2 94 %.   1. General middle aged white female lying in bed in NAD,    2. Normal affect and insight, Not Suicidal or Homicidal, Awake Alert, Oriented X 3.  3. No F.N deficits, ALL C.Nerves Intact, Strength 5/5 all 4 extremities, Sensation intact all 4 extremities, Plantars down going.  4. Ears and Eyes appear Normal, Conjunctivae clear, PERRLA. Moist Oral Mucosa.  5. Supple Neck, No JVD, No cervical lymphadenopathy appriciated, No Carotid Bruits.  6. Symmetrical Chest wall movement, Good air movement bilaterally, ++ rales few wheezes  7. RRR, No Gallops, Rubs or Murmurs, No Parasternal Heave.  8. Positive Bowel Sounds, Abdomen Soft, No tenderness, No organomegaly appriciated,No rebound -guarding or rigidity.  9.  No Cyanosis, Normal Skin Turgor, No Skin Rash or Bruise.  10. Good muscle tone,  joints appear normal , no effusions, Normal ROM.  11. No Palpable Lymph Nodes in Neck or Axillae     Data Review  CBC  Recent Labs Lab 08/09/14 0925  WBC 24.8*  HGB 14.9  HCT 45.0  PLT 353  MCV 95.3  MCH 31.6  MCHC 33.1  RDW 13.6  LYMPHSABS 1.4  MONOABS 1.1*  EOSABS 4.3*  BASOSABS 0.0   ------------------------------------------------------------------------------------------------------------------  Chemistries   Recent Labs Lab 08/09/14 0925  NA 143  K 4.7  CL 103  CO2 27  GLUCOSE 113*  BUN 25*  CREATININE 1.20*  CALCIUM 10.1    ------------------------------------------------------------------------------------------------------------------ estimated creatinine clearance is 34.7 mL/min (by C-G formula based on Cr of 1.2). ------------------------------------------------------------------------------------------------------------------ No results for input(s): TSH, T4TOTAL, T3FREE, THYROIDAB in the last 72 hours.  Invalid input(s): FREET3   Coagulation profile No results for input(s): INR, PROTIME in the last 168 hours. ------------------------------------------------------------------------------------------------------------------- No results for input(s): DDIMER in the last 72 hours. -------------------------------------------------------------------------------------------------------------------  Cardiac Enzymes  Recent Labs Lab 08/09/14 0925  TROPONINI <0.30   ------------------------------------------------------------------------------------------------------------------ Invalid input(s): POCBNP   ---------------------------------------------------------------------------------------------------------------  Urinalysis    Component Value Date/Time   COLORURINE YELLOW 08/09/2014 [place] 08/09/2014 1343  LABSPEC 1.011 08/09/2014 1343   PHURINE 5.5 08/09/2014 1343   GLUCOSEU NEGATIVE 08/09/2014 1343   HGBUR NEGATIVE 08/09/2014 1343   BILIRUBINUR NEGATIVE 08/09/2014 1343   BILIRUBINUR neg 07/09/2014 1039   KETONESUR NEGATIVE 08/09/2014 1343   PROTEINUR NEGATIVE 08/09/2014 1343   PROTEINUR neg 07/09/2014 1039   UROBILINOGEN 0.2 08/09/2014 1343   UROBILINOGEN 0.2 07/09/2014 1039   NITRITE NEGATIVE 08/09/2014 1343   NITRITE neg 07/09/2014 1039   LEUKOCYTESUR NEGATIVE 08/09/2014 1343    ----------------------------------------------------------------------------------------------------------------  Imaging results:   Ct Chest Wo Contrast  08/09/2014   CLINICAL  DATA:  Increased cough, shortness of breath for 2 days. Chronic COPD.  EXAM: CT CHEST WITHOUT CONTRAST  TECHNIQUE: Multidetector CT imaging of the chest was performed following the standard protocol without IV contrast.  COMPARISON:  Chest x-ray earlier today.  Chest CT 04/28/2014  FINDINGS: Moderate to severe COPD/ emphysema. Biapical scarring, some which has a nodular appearance again noted, not significantly changed since prior study. Previously seen 5 mm left apical nodule has decreased in size measuring 3 mm currently. Previously measured 9 mm posterior left upper lobe nodule has decreased to 7 mm. Previously seen left lower lobe nodular area that measured 11 mm currently measures 9 mm. Other nodular areas throughout scratch head other previously seen nodular areas of also improved or or stable. There is a new nodular area peripherally in the right upper lobe on image 19 measuring 11 mm. Chronic interstitial prominence throughout the lungs are again noted, stable. No pleural effusions.  Mildly enlarged precarinal lymph node again noted measuring 20 x 20 mm, not significantly changed. Other borderline sized and small mediastinal lymph nodes are again noted and stable. No visible hilar adenopathy. No axillary adenopathy.  Chest wall soft tissues are unremarkable. Imaging into the upper abdomen shows no acute findings.  No acute bony abnormality or focal bony abnormality.  IMPRESSION: Changes of moderate to severe emphysema. Chronic interstitial disease again noted with peripheral interstitial densities, stable.  The previously seen nodular densities are stable or have improved since prior study. There is a new nodular density in the right upper lobe peripherally. Given the short time. And improvement of the other nodules since prior study, I favor this is inflammatory.  Stable mild mediastinal adenopathy, likely reactive.   Electronically Signed   By: Rolm Baptise M.D.   On: 08/09/2014 11:31   Dg Chest Port 1  View  08/09/2014   CLINICAL DATA:  COPD exacerbation. Chest pain on inspiration which shortness of breath.  EXAM: PORTABLE CHEST - 1 VIEW  COMPARISON:  04/21/2014 and 01/09/2014 as well as CT 04/28/2014.  FINDINGS: Lungs are somewhat hyperexpanded with diffuse bilateral coarse interstitial markings without significant change from 04/21/2014 although there has been progression compared to 01/09/2014. Minimal peripheral nodularity over the lateral right upper lobe slightly worse. Mild focal nodularity over the medial left upper lobe slightly worse. No evidence of effusion. Cardiomediastinal silhouette and remainder of the exam is unchanged.  IMPRESSION: Chronic bilateral interstitial disease without significant change from 04/21/2014 but with slight progression compared to 01/09/2014. Patchy areas of nodularity slightly worse over the upper lungs likely an inflammatory or atypical infectious process and much less likely a neoplastic process. Consider follow-up noncontrast chest CT as previously recommended as more accurate evaluation of interval change.   Electronically Signed   By: Marin Olp M.D.   On: 08/09/2014 10:39    My personal review of EKG: Rhythm NSR, Rate  84 /min,  no  Acute ST changes    Assessment & Plan   1. Early CAP with predominantly emphysematous COPD exacerbation. Will be admitted for 24 hours, sputum and blood cultures, empiric IV Rocephin-azithromycin-Solu-Medrol, nebulizer treatments and oxygen as needed, monitor cultures.   2. Rales on exam but no JVD or peripheral edema, no fluid on x-ray, question if she has underlying undiagnosed pulmonary fibrosis as well or could be secondary to advanced emphysema. Monitor clinically.   3. Mild ARF. Check urine electrolytes, gentle hydration, repeat BMP in the morning.   4. GERD. Continue PPI.   5. Osteoporosis. Continue oral supplementation of calcium and vitamin D.   6. Lung nodule. Advanced emphysema, questionable pulmonary  fibrosis. Recommend outpatient pulmonary follow-up within 2-3 weeks post discharge.     DVT Prophylaxis Heparin   AM Labs Ordered, also please review Full Orders  Family Communication: Admission, patients condition and plan of care including tests being ordered have been discussed with the patient  who indicates understanding and agree with the plan and Code Status.  Code Status Full  Likely DC to  Home  Condition GUARDED     Time spent in minutes : 35    Finley Chevez K M.D on 08/09/2014 at 4:12 PM  Between 7am to 7pm - Pager - [phone]  After 7pm go to [email or web address] - password [email or web address]  And look for the night coverage person covering me after hours  [facility] Office  [phone]

## 2014-08-09 NOTE — ED Notes (Signed)
Patient here with increasing shortness of breath since yesterday. History of COPD and last neb and inhaler last pm. Reports increased cough that is dry

## 2014-08-09 NOTE — ED Notes (Signed)
MD at bedside. 

## 2014-08-09 NOTE — Progress Notes (Signed)
Pt admitted to [facility] from [place].  O2 /2L via [place].  Texted Dr. Wynelle Cleveland of pt location.

## 2014-08-09 NOTE — ED Provider Notes (Signed)
CSN: [id number]     Arrival date & time 08/09/14  0900 History   First MD Initiated Contact with Patient 08/09/14 0911     Chief Complaint  Patient presents with  . Shortness of Breath     (Consider location/radiation/quality/duration/timing/severity/associated sxs/prior Treatment) HPI  This is a 68 year old female who presents with shortness of breath and cough. Patient reports 2 day history of increasing shortness of breath. Reports dry cough. Denies any fevers. Reports that she wears oxygen at home as needed but normally does not need it.  He reports chest pain that is worse with coughing. On initial evaluation, patient noted to be mid 80s on room air. She was placed on 2 L nasal cannula and repeat O2 sat 95%.  Patient denies any leg swelling.  Past Medical History  Diagnosis Date  . COPD (chronic obstructive pulmonary disease)   . Hypertension   . Emphysema of lung   . Anxiety 06/24/2012  . Essential hypertension 06/24/2012  . Emphysema 06/24/2012  . History of Left leg claudication 06/24/2012   Past Surgical History  Procedure Laterality Date  . Abdominal aortagram  12/09/12   Family History  Problem Relation Age of Onset  . Cancer Mother   . Heart attack Father   . Deep vein thrombosis Father   . Hyperlipidemia Father   . Hypertension Father    History  Substance Use Topics  . Smoking status: Former Smoker -- 1.50 packs/day for 30 years    Types: Cigarettes    Quit date: 12/30/2009  . Smokeless tobacco: Never Used  . Alcohol Use: No   OB History    No data available     Review of Systems  Constitutional: Negative for fever.  Respiratory: Positive for cough, chest tightness and shortness of breath.   Cardiovascular: Positive for chest pain. Negative for leg swelling.  Gastrointestinal: Negative for nausea, vomiting and abdominal pain.  Genitourinary: Negative for dysuria.  Musculoskeletal: Negative for back pain.  Neurological: Negative for headaches.  All  other systems reviewed and are negative.     Allergies  Codeine and Levaquin  Home Medications   Prior to Admission medications   Medication Sig Start Date End Date Taking? Authorizing Provider  albuterol (PROVENTIL HFA;VENTOLIN HFA) 108 (90 BASE) MCG/ACT inhaler Inhale two puffs every 4-6 hours only as needed for shortness of breath or wheezing. 04/21/14 04/21/15  Sean Hommel, DO  albuterol (PROVENTIL) (2.5 MG/3ML) 0.083% nebulizer solution Take 3 mLs (2.5 mg total) by nebulization every 4 (four) hours as needed for wheezing or shortness of breath. 04/21/14   Sean Hommel, DO  amLODipine (NORVASC) 10 MG tablet TAKE 1 TABLET EVERY DAY 07/23/14   Marcial Pacas, DO  aspirin EC 325 MG tablet Take 1 tablet (325 mg total) by mouth daily. 11/04/12   Marcial Pacas, DO  calcium carbonate (TUMS) 500 MG chewable tablet Chew 1 tablet by mouth daily as needed for heartburn.    Historical Provider, MD  Calcium Carbonate-Vitamin D (CALCIUM-VITAMIN D) 500-200 MG-UNIT per tablet Take 1 tablet by mouth daily.    Historical Provider, MD  ciprofloxacin (CIPRO) 500 MG tablet Take 1 tablet (500 mg total) by mouth 2 (two) times daily. 07/09/14   Sean Hommel, DO  esomeprazole (NEXIUM) 40 MG capsule Take 1 capsule (40 mg total) by mouth daily. 02/24/13   Sean Hommel, DO  hydrochlorothiazide (HYDRODIURIL) 25 MG tablet TAKE 1 TABLET EVERY DAY 10/24/13   Marcial Pacas, DO  metroNIDAZOLE (FLAGYL) 500 MG tablet Take 1  tablet (500 mg total) by mouth 3 (three) times daily. 07/09/14   Sean Hommel, DO  nystatin (MYCOSTATIN) 100000 UNIT/ML suspension Take 5 mLs (500,000 Units total) by mouth 4 (four) times daily. Use for 3-4 days after symptoms resolve. 04/28/14   Marcial Pacas, DO  Pitavastatin Calcium 2 MG TABS Take 1 tablet (2 mg total) by mouth daily. 04/22/14   Sean Hommel, DO  pregabalin (LYRICA) 50 MG capsule Take 1 capsule (50 mg total) by mouth 3 (three) times daily. 06/22/14   Sean Hommel, DO  tiotropium (SPIRIVA) 18 MCG inhalation capsule  Place 1 capsule (18 mcg total) into inhaler and inhale daily. 04/21/14   Sean Hommel, DO   BP 128/45 mmHg  Pulse 99  Temp(Src) 97.8 F (36.6 C) (Oral)  Resp 20  Wt 110 lb (49.896 kg)  SpO2 94% Physical Exam  Constitutional: She is oriented to person, place, and time.  Mild tachypnea  HENT:  Head: Normocephalic and atraumatic.  Mouth/Throat: Oropharynx is clear and moist.  Eyes: Pupils are equal, round, and reactive to light.  Cardiovascular: Normal rate, regular rhythm and normal heart sounds.   No murmur heard. Pulmonary/Chest: No respiratory distress. She has wheezes. She exhibits tenderness.  Mild tachypnea, poor air movement, pursed lip breathing, scant wheezing  Abdominal: Soft. Bowel sounds are normal. There is no tenderness. There is no rebound.  Musculoskeletal: She exhibits no edema.  Neurological: She is alert and oriented to person, place, and time.  Skin: Skin is warm and dry.  Psychiatric: She has a normal mood and affect.  Nursing note and vitals reviewed.   ED Course  Procedures (including critical care time)  CRITICAL CARE Performed by: Thayer Jew, F   Total critical care time: 35 min  Critical care time was exclusive of separately billable procedures and treating other patients.  Critical care was necessary to treat or prevent imminent or life-threatening deterioration.  Critical care was time spent personally by me on the following activities: development of treatment plan with patient and/or surrogate as well as nursing, discussions with consultants, evaluation of patient's response to treatment, examination of patient, obtaining history from patient or surrogate, ordering and performing treatments and interventions, ordering and review of laboratory studies, ordering and review of radiographic studies, pulse oximetry and re-evaluation of patient's condition.  Labs Review Labs Reviewed  CBC WITH DIFFERENTIAL - Abnormal; Notable for the following:     WBC 24.8 (*)    Neutro Abs 18.1 (*)    Lymphocytes Relative 6 (*)    Monocytes Absolute 1.1 (*)    Eosinophils Relative 17 (*)    Eosinophils Absolute 4.3 (*)    All other components within normal limits  BASIC METABOLIC PANEL - Abnormal; Notable for the following:    Glucose, Bld 113 (*)    BUN 25 (*)    Creatinine, Ser 1.20 (*)    GFR calc non Af Amer 45 (*)    GFR calc Af Amer 53 (*)    All other components within normal limits  PRO B NATRIURETIC PEPTIDE - Abnormal; Notable for the following:    Pro B Natriuretic peptide (BNP) 431.0 (*)    All other components within normal limits  TROPONIN I  URINALYSIS, ROUTINE W REFLEX MICROSCOPIC    Imaging Review Ct Chest Wo Contrast  08/09/2014   CLINICAL DATA:  Increased cough, shortness of breath for 2 days. Chronic COPD.  EXAM: CT CHEST WITHOUT CONTRAST  TECHNIQUE: Multidetector CT imaging of the chest was performed  following the standard protocol without IV contrast.  COMPARISON:  Chest x-ray earlier today.  Chest CT 04/28/2014  FINDINGS: Moderate to severe COPD/ emphysema. Biapical scarring, some which has a nodular appearance again noted, not significantly changed since prior study. Previously seen 5 mm left apical nodule has decreased in size measuring 3 mm currently. Previously measured 9 mm posterior left upper lobe nodule has decreased to 7 mm. Previously seen left lower lobe nodular area that measured 11 mm currently measures 9 mm. Other nodular areas throughout scratch head other previously seen nodular areas of also improved or or stable. There is a new nodular area peripherally in the right upper lobe on image 19 measuring 11 mm. Chronic interstitial prominence throughout the lungs are again noted, stable. No pleural effusions.  Mildly enlarged precarinal lymph node again noted measuring 20 x 20 mm, not significantly changed. Other borderline sized and small mediastinal lymph nodes are again noted and stable. No visible hilar  adenopathy. No axillary adenopathy.  Chest wall soft tissues are unremarkable. Imaging into the upper abdomen shows no acute findings.  No acute bony abnormality or focal bony abnormality.  IMPRESSION: Changes of moderate to severe emphysema. Chronic interstitial disease again noted with peripheral interstitial densities, stable.  The previously seen nodular densities are stable or have improved since prior study. There is a new nodular density in the right upper lobe peripherally. Given the short time. And improvement of the other nodules since prior study, I favor this is inflammatory.  Stable mild mediastinal adenopathy, likely reactive.   Electronically Signed   By: Rolm Baptise M.D.   On: 08/09/2014 11:31   Dg Chest Port 1 View  08/09/2014   CLINICAL DATA:  COPD exacerbation. Chest pain on inspiration which shortness of breath.  EXAM: PORTABLE CHEST - 1 VIEW  COMPARISON:  04/21/2014 and 01/09/2014 as well as CT 04/28/2014.  FINDINGS: Lungs are somewhat hyperexpanded with diffuse bilateral coarse interstitial markings without significant change from 04/21/2014 although there has been progression compared to 01/09/2014. Minimal peripheral nodularity over the lateral right upper lobe slightly worse. Mild focal nodularity over the medial left upper lobe slightly worse. No evidence of effusion. Cardiomediastinal silhouette and remainder of the exam is unchanged.  IMPRESSION: Chronic bilateral interstitial disease without significant change from 04/21/2014 but with slight progression compared to 01/09/2014. Patchy areas of nodularity slightly worse over the upper lungs likely an inflammatory or atypical infectious process and much less likely a neoplastic process. Consider follow-up noncontrast chest CT as previously recommended as more accurate evaluation of interval change.   Electronically Signed   By: Marin Olp M.D.   On: 08/09/2014 10:39     EKG Interpretation   Date/Time:  Sunday August 09 2014  09:26:15 EST Ventricular Rate:  84 PR Interval:  162 QRS Duration: 70 QT Interval:  370 QTC Calculation: 437 R Axis:   82 Text Interpretation:  Normal sinus rhythm No significant change since last  tracing Confirmed by Aldon Hengst  MD, [place]ill ([id number]) on 08/09/2014 10:26:45  AM      MDM   Final diagnoses:  Shortness of breath  COPD exacerbation    Patient presents with shortness of breath.  Initially hypoxic and not moving good air. Patient given DuoNeb, prednisone. Basic labwork obtained and notable for leukocytosis to 24. Patient is not currently on prednisone.  Patient also with mild AK. Troponin and BNP are reassuring.  Chest x-ray with questionable increasing nodularity. Will obtain CT. CT shows stable nodules and favor  inflammatory response. On multiple rechecks, patient improving with DuoNeb's. She is currently on 1 L of oxygen satting 90-92%. Given leukocytosis, patient was given Rocephin and azithromycin for COPD exacerbation. Will admit for further management and frequent duo nebs.    Merryl Hacker, MD 08/09/14 [phone]

## 2014-08-10 LAB — BASIC METABOLIC PANEL
Anion gap: 16 — ABNORMAL HIGH (ref 5–15)
BUN: 22 mg/dL (ref 6–23)
CO2: 21 meq/L (ref 19–32)
CREATININE: 0.89 mg/dL (ref 0.50–1.10)
Calcium: 9 mg/dL (ref 8.4–10.5)
Chloride: 103 mEq/L (ref 96–112)
GFR calc non Af Amer: 65 mL/min — ABNORMAL LOW (ref >90)
GFR, EST AFRICAN AMERICAN: 75 mL/min — AB (ref >90)
GLUCOSE: 150 mg/dL — AB (ref 70–99)
POTASSIUM: 3.7 meq/L (ref 3.7–5.3)
Sodium: 140 mEq/L (ref 137–147)

## 2014-08-10 LAB — CREATININE, URINE, RANDOM: Creatinine, Urine: 16.31 mg/dL

## 2014-08-10 LAB — SODIUM, URINE, RANDOM

## 2014-08-10 LAB — CBC
HEMATOCRIT: 38.4 % (ref 36.0–46.0)
Hemoglobin: 12.7 g/dL (ref 12.0–15.0)
MCH: 30.9 pg (ref 26.0–34.0)
MCHC: 33.1 g/dL (ref 30.0–36.0)
MCV: 93.4 fL (ref 78.0–100.0)
Platelets: 330 10*3/uL (ref 150–400)
RBC: 4.11 MIL/uL (ref 3.87–5.11)
RDW: 13.6 % (ref 11.5–15.5)
WBC: 17.1 10*3/uL — ABNORMAL HIGH (ref 4.0–10.5)

## 2014-08-10 LAB — OSMOLALITY, URINE: Osmolality, Ur: 154 mOsm/kg — ABNORMAL LOW (ref 390–1090)

## 2014-08-10 MED ORDER — ALBUTEROL SULFATE (2.5 MG/3ML) 0.083% IN NEBU: 2.5000 mg | INHALATION_SOLUTION | RESPIRATORY_TRACT | Status: DC | PRN

## 2014-08-10 MED ORDER — PREDNISONE 20 MG PO TABS
40.0000 mg | ORAL_TABLET | Freq: Every day | ORAL | Status: DC
  Administered 2014-08-10 – 2014-08-11 (×2): 40 mg via ORAL
  Filled 2014-08-10 (×3): qty 2

## 2014-08-10 NOTE — Plan of Care (Signed)
Problem: Phase I Progression Outcomes Goal: O2 sats > or equal 90% or at baseline Outcome: Progressing Goal: Dyspnea controlled at rest Outcome: Progressing Goal: Hemodynamically stable Outcome: Progressing Goal: Pain controlled Outcome: Completed/Met Date Met:  08/10/14 Pt voices no pain Goal: Pt OOB to Walk or Exercise Daily With Nursing or PT Patient OOB to walk or exercise daily with nursing or PT if activity order permits Outcome: Progressing Goal: Discharge plan established Outcome: Progressing Goal: Tolerating diet Outcome: Progressing  Problem: Phase II Progression Outcomes Goal: O2 sats > equal to 90% on RA or at baseline Outcome: Progressing Goal: Pain controlled on oral analgesia Outcome: Completed/Met Date Met:  08/10/14 Goal: Dyspnea controlled w/progressive activity Outcome: Progressing Goal: ADLs completed with minimal assistance Outcome: Progressing Goal: Activity at appropriate level-compared to baseline (UP IN CHAIR FOR HEMODIALYSIS) Outcome: Progressing Goal: Discharge plan remains appropriate-arrangements made Outcome: Progressing Goal: Other Phase II Outcomes/Goals Outcome: Progressing  Problem: Discharge Progression Outcomes Goal: Dyspnea controlled Outcome: Progressing Goal: O2 sats > or equal 90% or at baseline Outcome: Progressing Goal: Independent ADLs or Home Health Care Outcome: Progressing Goal: Discharge plan in place and appropriate Outcome: Progressing Goal: Pain controlled with appropriate interventions Outcome: Progressing Goal: Tolerating diet Outcome: Progressing Goal: Activity appropriate for discharge plan Outcome: Progressing Goal: Hemodynamically stable Outcome: Progressing

## 2014-08-10 NOTE — Progress Notes (Addendum)
PROGRESS NOTE    Consuela Widener PIR:[id number] DOB: 16-Oct-1945 DOA: 08/09/2014 PCP: Marcial Pacas, DO  HPI/Brief narrative 68 year old female patient with history of COPD-not on home oxygen, essential hypertension, former smoker, who presented with complaints of dry cough, worsening dyspnea but denied wheezing, fever, chills or chest pain. She was admitted for COPD exacerbation and? CAP   Assessment/Plan:  1. COPD exacerbation: Treated empirically with antibiotics, IV Solu-Medrol, oxygen and bronchodilator nebulizations. Clinically much improved. Will transition steroids to by mouth taper. Monitor additional 24 hours prior to discharge in a.m. CT chest more consistent with interstitial lung disease rather than pneumonia especially in the absence of fever or productive cough. In any event will continue to treat with antibiotics which will cover for pneumonia. Blood cultures 2: Negative to date. BNP unremarkable. 2. Acute hypoxic respiratory failure: States that her oxygen saturations were in the 70s in the ED. Likely secondary to COPD exacerbation. Improved. Titrate oxygen to maintain sats greater than 90%. 3. Interstitial lung disease: Outpatient follow-up and consider pulmonology consultation. Unclear etiology. 4. Leukocytosis: Possibly from stress reaction and steroids. Patient has had leukocytosis in the 12 range earlier this year so CBCs need to be closely followed outpatient. 5. Essential hypertension: Controlled. Continue amlodipine and HCTZ. 6. Mild AKI: resolved. 7. Right upper lobe pulmonary nodule: Seen on CT chest. Outpatient follow-up as deemed necessary. 8. History of GERD: Continue PPI 9. History of dyslipidemia: Continue statins   Code Status: Full Family Communication: Discussed with spouse at bedside. Disposition Plan: Possible discharge home 11/24.   Consultants:  None  Procedures:  None  Antibiotics:  IV Rocephin 11/22 >  IV azithromycin 11/22 >    Subjective: Patient feels much better. Minimal dry cough has improved. Denies dyspnea, wheezing or chest pain.  Objective: Filed Vitals:   08/09/14 1708 08/09/14 2100 08/10/14 0459 08/10/14 0844  BP:  137/50 134/52   Pulse:  98 90   Temp:  97.6 F (36.4 C) 98.2 F (36.8 C)   TempSrc:  Oral Oral   Resp:  18 18   Height:      Weight:      SpO2: 93% 96% 92% 94%    Intake/Output Summary (Last 24 hours) at 08/10/14 1223 Last data filed at 08/10/14 0032  Gross per 24 hour  Intake      0 ml  Output    250 ml  Net   -250 ml   Filed Weights   08/09/14 0908 08/09/14 1444  Weight: 49.896 kg (110 lb) 48.988 kg (108 lb)     Exam:  General exam: Pleasant middle-aged female patient sitting up comfortably in chair without distress. Respiratory system: Globally distant breath sounds but clear to auscultation. No increased work of breathing. Able to speak in full sentences. Cardiovascular system: S1 & S2 heard, RRR. No JVD, murmurs, gallops, clicks or pedal edema. Gastrointestinal system: Abdomen is nondistended, soft and nontender. Normal bowel sounds heard. Central nervous system: Alert and oriented. No focal neurological deficits. Extremities: Symmetric 5 x 5 power.   Data Reviewed: Basic Metabolic Panel:  Recent Labs Lab 08/09/14 0925 08/09/14 1653 08/10/14 0526  NA 143  --  140  K 4.7  --  3.7  CL 103  --  103  CO2 27  --  21  GLUCOSE 113*  --  150*  BUN 25*  --  22  CREATININE 1.20* 1.07 0.89  CALCIUM 10.1  --  9.0   Liver Function Tests: No results for input(s): AST,  ALT, ALKPHOS, BILITOT, PROT, ALBUMIN in the last 168 hours. No results for input(s): LIPASE, AMYLASE in the last 168 hours. No results for input(s): AMMONIA in the last 168 hours. CBC:  Recent Labs Lab 08/09/14 0925 08/09/14 1653 08/10/14 0526  WBC 24.8* 14.7* 17.1*  NEUTROABS 18.1*  --   --   HGB 14.9 13.2 12.7  HCT 45.0 39.7 38.4  MCV 95.3 95.7 93.4  PLT 353 333 330   Cardiac  Enzymes:  Recent Labs Lab 08/09/14 0925  TROPONINI <0.30   BNP (last 3 results)  Recent Labs  08/09/14 0925  PROBNP 431.0*   CBG: No results for input(s): GLUCAP in the last 168 hours.  Recent Results (from the past 240 hour(s))  Culture, blood (routine x 2)     Status: None (Preliminary result)   Collection Time: 08/09/14  4:53 PM  Result Value Ref Range Status   Specimen Description BLOOD RIGHT HAND  Final   Special Requests BOTTLES DRAWN AEROBIC AND ANAEROBIC 10CC  Final   Culture  Setup Time   Final    08/09/2014 21:35 Performed at [facility]    Culture   Final           BLOOD CULTURE RECEIVED NO GROWTH TO DATE CULTURE WILL BE HELD FOR 5 DAYS BEFORE ISSUING A FINAL NEGATIVE REPORT Performed at [facility]    Report Status PENDING  Incomplete  Culture, blood (routine x 2)     Status: None (Preliminary result)   Collection Time: 08/09/14  5:03 PM  Result Value Ref Range Status   Specimen Description BLOOD LEFT HAND  Final   Special Requests BOTTLES DRAWN AEROBIC ONLY [place]  Final   Culture  Setup Time   Final    08/09/2014 21:35 Performed at [facility]    Culture   Final           BLOOD CULTURE RECEIVED NO GROWTH TO DATE CULTURE WILL BE HELD FOR 5 DAYS BEFORE ISSUING A FINAL NEGATIVE REPORT Performed at [facility]    Report Status PENDING  Incomplete      Studies: Ct Chest Wo Contrast  08/09/2014   CLINICAL DATA:  Increased cough, shortness of breath for 2 days. Chronic COPD.  EXAM: CT CHEST WITHOUT CONTRAST  TECHNIQUE: Multidetector CT imaging of the chest was performed following the standard protocol without IV contrast.  COMPARISON:  Chest x-ray earlier today.  Chest CT 04/28/2014  FINDINGS: Moderate to severe COPD/ emphysema. Biapical scarring, some which has a nodular appearance again noted, not significantly changed since prior study. Previously seen 5 mm left apical nodule has decreased in size measuring 3 mm currently.  Previously measured 9 mm posterior left upper lobe nodule has decreased to 7 mm. Previously seen left lower lobe nodular area that measured 11 mm currently measures 9 mm. Other nodular areas throughout scratch head other previously seen nodular areas of also improved or or stable. There is a new nodular area peripherally in the right upper lobe on image 19 measuring 11 mm. Chronic interstitial prominence throughout the lungs are again noted, stable. No pleural effusions.  Mildly enlarged precarinal lymph node again noted measuring 20 x 20 mm, not significantly changed. Other borderline sized and small mediastinal lymph nodes are again noted and stable. No visible hilar adenopathy. No axillary adenopathy.  Chest wall soft tissues are unremarkable. Imaging into the upper abdomen shows no acute findings.  No acute bony abnormality or focal bony abnormality.  IMPRESSION: Changes of moderate to severe emphysema. Chronic interstitial disease again noted with peripheral interstitial densities, stable.  The previously seen nodular densities are stable or have improved since prior study. There is a new nodular density in the right upper lobe peripherally. Given the short time. And improvement of the other nodules since prior study, I favor this is inflammatory.  Stable mild mediastinal adenopathy, likely reactive.   Electronically Signed   By: Rolm Baptise M.D.   On: 08/09/2014 11:31   Dg Chest Port 1 View  08/09/2014   CLINICAL DATA:  COPD exacerbation. Chest pain on inspiration which shortness of breath.  EXAM: PORTABLE CHEST - 1 VIEW  COMPARISON:  04/21/2014 and 01/09/2014 as well as CT 04/28/2014.  FINDINGS: Lungs are somewhat hyperexpanded with diffuse bilateral coarse interstitial markings without significant change from 04/21/2014 although there has been progression compared to 01/09/2014. Minimal peripheral nodularity over the lateral right upper lobe slightly worse. Mild focal nodularity over the medial left  upper lobe slightly worse. No evidence of effusion. Cardiomediastinal silhouette and remainder of the exam is unchanged.  IMPRESSION: Chronic bilateral interstitial disease without significant change from 04/21/2014 but with slight progression compared to 01/09/2014. Patchy areas of nodularity slightly worse over the upper lungs likely an inflammatory or atypical infectious process and much less likely a neoplastic process. Consider follow-up noncontrast chest CT as previously recommended as more accurate evaluation of interval change.   Electronically Signed   By: Marin Olp M.D.   On: 08/09/2014 10:39        Scheduled Meds: . albuterol  2.5 mg Nebulization Q6H  . amLODipine  10 mg Oral Daily  . aspirin EC  325 mg Oral Daily  . azithromycin  500 mg Intravenous Q24H  . calcium-vitamin D  1 tablet Oral Q breakfast  . cefTRIAXone (ROCEPHIN)  IV  1 g Intravenous Q24H  . heparin  5,000 Units Subcutaneous 3 times per day  . hydrochlorothiazide  25 mg Oral Daily  . nystatin  5 mL Oral QID  . pantoprazole  40 mg Oral Daily  . pravastatin  20 mg Oral q1800  . predniSONE  40 mg Oral Q breakfast  . pregabalin  50 mg Oral TID  . tiotropium  18 mcg Inhalation Daily  . tiotropium  18 mcg Inhalation Daily   Continuous Infusions:   Active Problems:   Essential hypertension   Hyperlipidemia LDL goal <70   GERD (gastroesophageal reflux disease)   Peripheral neuropathy   Pulmonary nodules   COPD exacerbation   CAP (community acquired pneumonia)    Time spent: 49 minutes    Ralphael Southgate, MD, FACP, FHM. [facility] Pager [phone]  If 7PM-7AM, please contact night-coverage [email or web address] Password [email or web address] 08/10/2014, 12:23 PM    LOS: 1 day

## 2014-08-10 NOTE — Progress Notes (Signed)
Pt states she takes breathing treatments as needed at home.  Pt changed to this schedule per request and RT assessment findings.

## 2014-08-11 ENCOUNTER — Encounter (HOSPITAL_COMMUNITY): Payer: Self-pay | Admitting: General Practice

## 2014-08-11 LAB — CBC
HEMATOCRIT: 38.3 % (ref 36.0–46.0)
Hemoglobin: 12.4 g/dL (ref 12.0–15.0)
MCH: 30.8 pg (ref 26.0–34.0)
MCHC: 32.4 g/dL (ref 30.0–36.0)
MCV: 95 fL (ref 78.0–100.0)
PLATELETS: 373 10*3/uL (ref 150–400)
RBC: 4.03 MIL/uL (ref 3.87–5.11)
RDW: 13.8 % (ref 11.5–15.5)
WBC: 23.1 10*3/uL — ABNORMAL HIGH (ref 4.0–10.5)

## 2014-08-11 MED ORDER — DOXYCYCLINE HYCLATE 100 MG PO CAPS: 100.0000 mg | ORAL_CAPSULE | Freq: Two times a day (BID) | ORAL | Status: DC

## 2014-08-11 MED ORDER — PITAVASTATIN CALCIUM 2 MG PO TABS: 2.0000 mg | ORAL_TABLET | ORAL | Status: DC

## 2014-08-11 MED ORDER — PREDNISONE 10 MG PO TABS: ORAL_TABLET | ORAL | Status: DC

## 2014-08-11 NOTE — Progress Notes (Addendum)
Home O2 arranged with [facility].  Sandi Mariscal, RN BSN MHA CCM  Case Manager, Trauma Service/Unit [id number] [phone]

## 2014-08-11 NOTE — Progress Notes (Signed)
SATURATION QUALIFICATIONS: (This note is used to comply with regulatory documentation for home oxygen)  Patient Saturations on Room Air at Rest = 93%  Patient Saturations on Room Air while Ambulating = 76%  Patient Saturations on 1 Liters of oxygen while Ambulating = 90%  Please briefly explain why patient needs home oxygen:

## 2014-08-11 NOTE — Discharge Summary (Signed)
Physician Discharge Summary  Cyndie Woodbeck OIB:[id number] DOB: 07/16/1946 DOA: 08/09/2014  PCP: Marcial Pacas, DO  Admit date: 08/09/2014 Discharge date: 08/11/2014  Time spent: Less than 30 minutes  Recommendations for Outpatient Follow-up:  1. Dr. Marcial Pacas, PCP in 5 days with repeat labs (CBC). Please follow final blood culture results that were sent from the hospital. 2. Outpatient follow-up of right upper lobe pulmonary nodule seen on CT, as deemed necessary. May consider pulmonology consultation to further evaluate this and interstitial lung disease that was seen on CT chest.  Discharge Diagnoses:  Active Problems:   Essential hypertension   Hyperlipidemia LDL goal <70   GERD (gastroesophageal reflux disease)   Peripheral neuropathy   Pulmonary nodules   COPD exacerbation   CAP (community acquired pneumonia)   Discharge Condition: Improved & Stable  Diet recommendation: Heart healthy diet.  Filed Weights   08/09/14 0908 08/09/14 1444 08/11/14 0448  Weight: 49.896 kg (110 lb) 48.988 kg (108 lb) 51.393 kg (113 lb 4.8 oz)    History of present illness:  68 year old female patient with history of COPD-on oxygen 2 L/m at bedtime, essential hypertension, former smoker, who presented with complaints of dry cough, worsening dyspnea but denied wheezing, fever, chills or chest pain. She was admitted for COPD exacerbation and? CAP  Hospital Course:    1. COPD exacerbation: Treated empirically with antibiotics, IV Solu-Medrol, oxygen and bronchodilator nebulizations. Clinically much improved. CT chest more consistent with interstitial lung disease rather than pneumonia especially in the absence of fever or productive cough. Blood cultures 2: Negative to date. BNP unremarkable. She was transitioned to oral prednisone which we will taper rapidly. She will be discharged on oral doxycycline to complete total 7 days course. 2. Acute on chronic hypoxic respiratory failure: States that  her oxygen saturations were in the 70s in the ED. Likely secondary to COPD exacerbation. Patient was on home oxygen at 2 L/m at bedtime prior to admission. Today, patient desaturates to 76% on room air while ambulating and hence has been advised to continue oxygen 2 L/m at all times until outpatient follow-up with her PCP. Improved. 3. Interstitial lung disease: Outpatient follow-up and consider pulmonology consultation. Unclear etiology. 4. Leukocytosis: Possibly from stress reaction and steroids. Patient has had leukocytosis in the 12 range earlier this year so CBCs need to be closely followed outpatient. Outpatient follow-up with CBCs and may consider hematology consultation. 5. Essential hypertension: Controlled. Continue amlodipine and HCTZ. 6. Mild AKI: resolved. 7. Right upper lobe pulmonary nodule: Seen on CT chest. Outpatient follow-up as deemed necessary. 8. History of GERD: Patient states that she does not take PPI daily and only takes Tums as needed due to mild intermittent symptoms. 9. History of dyslipidemia: Continue statins-patient states that she takes the statin every other day.  Consultations:  None  Procedures:  None    Discharge Exam:  Complaints:  Denies complaints. No cough, dyspnea or chest pain. No other complaints reported.  Filed Vitals:   08/11/14 0448 08/11/14 1010 08/11/14 1305 08/11/14 1310  BP: 140/56 142/53 140/52   Pulse: 81 79 89   Temp: 97.8 F (36.6 C)  98.3 F (36.8 C)   TempSrc: Oral  Oral   Resp: 18  16   Height:      Weight: 51.393 kg (113 lb 4.8 oz)     SpO2: 2%  98% 78%   General exam: Pleasant middle-aged female patient sitting up comfortably in chair without distress. Respiratory system: clear to auscultation. No increased  work of breathing. Able to speak in full sentences. Cardiovascular system: S1 & S2 heard, RRR. No JVD, murmurs, gallops, clicks or pedal edema. Gastrointestinal system: Abdomen is nondistended, soft and nontender.  Normal bowel sounds heard. Central nervous system: Alert and oriented. No focal neurological deficits. Extremities: Symmetric 5 x 5 power.  Discharge Instructions      Discharge Instructions    Call MD for:  difficulty breathing, headache or visual disturbances    Complete by:  As directed      Diet - low sodium heart healthy    Complete by:  As directed      Discharge instructions    Complete by:  As directed   Continuous home oxygen at 2 L/m via nasal cannula.     Increase activity slowly    Complete by:  As directed             Medication List    STOP taking these medications        NEXIUM 40 MG capsule  Generic drug:  esomeprazole      TAKE these medications        albuterol (2.5 MG/3ML) 0.083% nebulizer solution  Commonly known as:  PROVENTIL  Take 3 mLs (2.5 mg total) by nebulization every 4 (four) hours as needed for wheezing or shortness of breath.     amLODipine 10 MG tablet  Commonly known as:  NORVASC  TAKE 1 TABLET EVERY DAY     aspirin EC 325 MG tablet  Take 1 tablet (325 mg total) by mouth daily.     calcium-vitamin D 500-200 MG-UNIT per tablet  Take 1 tablet by mouth daily.     doxycycline 100 MG capsule  Commonly known as:  VIBRAMYCIN  Take 1 capsule (100 mg total) by mouth 2 (two) times daily.     hydrochlorothiazide 25 MG tablet  Commonly known as:  HYDRODIURIL  TAKE 1 TABLET EVERY DAY     nystatin 100000 UNIT/ML suspension  Commonly known as:  MYCOSTATIN  Take 5 mLs (500,000 Units total) by mouth 4 (four) times daily. Use for 3-4 days after symptoms resolve.     Pitavastatin Calcium 2 MG Tabs  Take 1 tablet (2 mg total) by mouth every other day.     predniSONE 10 MG tablet  Commonly known as:  DELTASONE  Take 3 tabs daily 1 day, then 2 tabs daily 1 day, then 1 tablet daily 1 day, then stop.  Start taking on:  08/12/2014     pregabalin 50 MG capsule  Commonly known as:  LYRICA  Take 1 capsule (50 mg total) by mouth 3 (three)  times daily.     tiotropium 18 MCG inhalation capsule  Commonly known as:  SPIRIVA  Place 1 capsule (18 mcg total) into inhaler and inhale daily.     TUMS 500 MG chewable tablet  Generic drug:  calcium carbonate  Chew 1 tablet by mouth daily as needed for heartburn.       Follow-up Information    Follow up with Marcial Pacas, DO. Schedule an appointment as soon as possible for a visit in 5 days.   Specialty:  Family Medicine   Why:  To be seen with repeat labs (CBC).   Contact information:   [place] [address] [phone]        The results of significant diagnostics from this hospitalization (including imaging, microbiology, ancillary and laboratory) are listed below for reference.    Significant  Diagnostic Studies: Ct Chest Wo Contrast  08/09/2014   CLINICAL DATA:  Increased cough, shortness of breath for 2 days. Chronic COPD.  EXAM: CT CHEST WITHOUT CONTRAST  TECHNIQUE: Multidetector CT imaging of the chest was performed following the standard protocol without IV contrast.  COMPARISON:  Chest x-ray earlier today.  Chest CT 04/28/2014  FINDINGS: Moderate to severe COPD/ emphysema. Biapical scarring, some which has a nodular appearance again noted, not significantly changed since prior study. Previously seen 5 mm left apical nodule has decreased in size measuring 3 mm currently. Previously measured 9 mm posterior left upper lobe nodule has decreased to 7 mm. Previously seen left lower lobe nodular area that measured 11 mm currently measures 9 mm. Other nodular areas throughout scratch head other previously seen nodular areas of also improved or or stable. There is a new nodular area peripherally in the right upper lobe on image 19 measuring 11 mm. Chronic interstitial prominence throughout the lungs are again noted, stable. No pleural effusions.  Mildly enlarged precarinal lymph node again noted measuring 20 x 20 mm, not significantly changed. Other borderline sized  and small mediastinal lymph nodes are again noted and stable. No visible hilar adenopathy. No axillary adenopathy.  Chest wall soft tissues are unremarkable. Imaging into the upper abdomen shows no acute findings.  No acute bony abnormality or focal bony abnormality.  IMPRESSION: Changes of moderate to severe emphysema. Chronic interstitial disease again noted with peripheral interstitial densities, stable.  The previously seen nodular densities are stable or have improved since prior study. There is a new nodular density in the right upper lobe peripherally. Given the short time. And improvement of the other nodules since prior study, I favor this is inflammatory.  Stable mild mediastinal adenopathy, likely reactive.   Electronically Signed   By: Rolm Baptise M.D.   On: 08/09/2014 11:31   Dg Chest Port 1 View  08/09/2014   CLINICAL DATA:  COPD exacerbation. Chest pain on inspiration which shortness of breath.  EXAM: PORTABLE CHEST - 1 VIEW  COMPARISON:  04/21/2014 and 01/09/2014 as well as CT 04/28/2014.  FINDINGS: Lungs are somewhat hyperexpanded with diffuse bilateral coarse interstitial markings without significant change from 04/21/2014 although there has been progression compared to 01/09/2014. Minimal peripheral nodularity over the lateral right upper lobe slightly worse. Mild focal nodularity over the medial left upper lobe slightly worse. No evidence of effusion. Cardiomediastinal silhouette and remainder of the exam is unchanged.  IMPRESSION: Chronic bilateral interstitial disease without significant change from 04/21/2014 but with slight progression compared to 01/09/2014. Patchy areas of nodularity slightly worse over the upper lungs likely an inflammatory or atypical infectious process and much less likely a neoplastic process. Consider follow-up noncontrast chest CT as previously recommended as more accurate evaluation of interval change.   Electronically Signed   By: Marin Olp M.D.   On:  08/09/2014 10:39    Microbiology: Recent Results (from the past 240 hour(s))  Culture, blood (routine x 2)     Status: None (Preliminary result)   Collection Time: 08/09/14  4:53 PM  Result Value Ref Range Status   Specimen Description BLOOD RIGHT HAND  Final   Special Requests BOTTLES DRAWN AEROBIC AND ANAEROBIC 10CC  Final   Culture  Setup Time   Final    08/09/2014 21:35 Performed at [facility]    Culture   Final           BLOOD CULTURE RECEIVED NO GROWTH TO DATE CULTURE WILL  BE HELD FOR 5 DAYS BEFORE ISSUING A FINAL NEGATIVE REPORT Performed at [facility]    Report Status PENDING  Incomplete  Culture, blood (routine x 2)     Status: None (Preliminary result)   Collection Time: 08/09/14  5:03 PM  Result Value Ref Range Status   Specimen Description BLOOD LEFT HAND  Final   Special Requests BOTTLES DRAWN AEROBIC ONLY 8CC  Final   Culture  Setup Time   Final    08/09/2014 21:35 Performed at [facility]    Culture   Final           BLOOD CULTURE RECEIVED NO GROWTH TO DATE CULTURE WILL BE HELD FOR 5 DAYS BEFORE ISSUING A FINAL NEGATIVE REPORT Performed at [facility]    Report Status PENDING  Incomplete     Labs: Basic Metabolic Panel:  Recent Labs Lab 08/09/14 0925 08/09/14 1653 08/10/14 0526  NA 143  --  140  K 4.7  --  3.7  CL 103  --  103  CO2 27  --  21  GLUCOSE 113*  --  150*  BUN 25*  --  22  CREATININE 1.20* 1.07 0.89  CALCIUM 10.1  --  9.0   Liver Function Tests: No results for input(s): AST, ALT, ALKPHOS, BILITOT, PROT, ALBUMIN in the last 168 hours. No results for input(s): LIPASE, AMYLASE in the last 168 hours. No results for input(s): AMMONIA in the last 168 hours. CBC:  Recent Labs Lab 08/09/14 0925 08/09/14 1653 08/10/14 0526 08/11/14 0453  WBC 24.8* 14.7* 17.1* 23.1*  NEUTROABS 18.1*  --   --   --   HGB 14.9 13.2 12.7 12.4  HCT 45.0 39.7 38.4 38.3  MCV 95.3 95.7 93.4 95.0  PLT 353 333 330 373    Cardiac Enzymes:  Recent Labs Lab 08/09/14 0925  TROPONINI <0.30   BNP: BNP (last 3 results)  Recent Labs  08/09/14 0925  PROBNP 431.0*   CBG: No results for input(s): GLUCAP in the last 168 hours.     Signed:  Vernell Leep, MD, FACP, FHM. [facility] Pager [phone]  If 7PM-7AM, please contact night-coverage [email or web address] Password [facility] 08/11/2014, 3:56 PM

## 2014-08-11 NOTE — Discharge Instructions (Signed)

## 2014-08-11 NOTE — Progress Notes (Signed)
Discharge paperwork reviewed with patient. No questions verbalized.

## 2014-08-15 LAB — CULTURE, BLOOD (ROUTINE X 2)
CULTURE: NO GROWTH
Culture: NO GROWTH

## 2014-08-19 ENCOUNTER — Ambulatory Visit (INDEPENDENT_AMBULATORY_CARE_PROVIDER_SITE_OTHER): Payer: 59 | Admitting: Family Medicine

## 2014-08-19 ENCOUNTER — Encounter: Payer: Self-pay | Admitting: Family Medicine

## 2014-08-19 VITALS — BP 138/68 | HR 81 | Wt 106.0 lb

## 2014-08-19 DIAGNOSIS — D72829 Elevated white blood cell count, unspecified: Secondary | ICD-10-CM

## 2014-08-19 DIAGNOSIS — J439 Emphysema, unspecified: Secondary | ICD-10-CM

## 2014-08-19 DIAGNOSIS — J441 Chronic obstructive pulmonary disease with (acute) exacerbation: Secondary | ICD-10-CM

## 2014-08-19 DIAGNOSIS — J189 Pneumonia, unspecified organism: Secondary | ICD-10-CM

## 2014-08-19 DIAGNOSIS — J849 Interstitial pulmonary disease, unspecified: Secondary | ICD-10-CM

## 2014-08-19 DIAGNOSIS — R918 Other nonspecific abnormal finding of lung field: Secondary | ICD-10-CM

## 2014-08-19 LAB — CBC
HEMATOCRIT: 44.7 % (ref 36.0–46.0)
Hemoglobin: 15 g/dL (ref 12.0–15.0)
MCH: 31.3 pg (ref 26.0–34.0)
MCHC: 33.6 g/dL (ref 30.0–36.0)
MCV: 93.3 fL (ref 78.0–100.0)
MPV: 10.8 fL (ref 9.4–12.4)
Platelets: 401 10*3/uL — ABNORMAL HIGH (ref 150–400)
RBC: 4.79 MIL/uL (ref 3.87–5.11)
RDW: 13.9 % (ref 11.5–15.5)
WBC: 13.7 10*3/uL — ABNORMAL HIGH (ref 4.0–10.5)

## 2014-08-19 NOTE — Progress Notes (Signed)
CC: Catherine Phillips is a 68 y.o. female is here for No chief complaint on file.   Subjective: HPI:   hospital follow-up for COPD exacerbation and community-acquired pneumonia  Admission November 22 with discharge on the 24th.  She was discharged with 2 L of oxygen to be weren't around the clock. She currently is only wearing it at bedtime and when up walking around the house during chores.  She denies any shortness of breath or wheezing but has been using oxygen just because he feels that it might be helping her recover faster. She believes she is back to 100%  And in her regular state of health. She continues to use albuterol  1-2 times a day and Spiriva daily.   She has now finished a course of doxycycline and a prednisone taper.   She denies any cough, shortness breath, wheezing, no chest discomfort. There has been no fevers, chills, nor confusion since discharge.  While hospitalized she had a CT scan that showed interstitial lung disease along with pulmonary nodules that were stable compared to prior films and also new ones that were felt to be from an inflammatory etiology.  While admitted she had a WBC in the 20s in the setting of oral and IV corticosteroids. Since discharge this has not been rechecked.    Review Of Systems Outlined In HPI  Past Medical History  Diagnosis Date  . COPD (chronic obstructive pulmonary disease)   . Hypertension   . Emphysema of lung   . Anxiety 06/24/2012  . Essential hypertension 06/24/2012  . Emphysema 06/24/2012  . History of Left leg claudication 06/24/2012  . Shortness of breath dyspnea     Past Surgical History  Procedure Laterality Date  . Abdominal aortagram  12/09/12   Family History  Problem Relation Age of Onset  . Cancer Mother   . Heart attack Father   . Deep vein thrombosis Father   . Hyperlipidemia Father   . Hypertension Father     History   Social History  . Marital Status: Married    Spouse Name: N/A    Number of Children: N/A  .  Years of Education: N/A   Occupational History  . Not on file.   Social History Main Topics  . Smoking status: Former Smoker -- 1.50 packs/day for 30 years    Types: Cigarettes    Quit date: 12/30/2009  . Smokeless tobacco: Never Used  . Alcohol Use: No  . Drug Use: No  . Sexual Activity: Not on file   Other Topics Concern  . Not on file   Social History Narrative     Objective: BP 138/68 mmHg  Pulse 81  Wt 106 lb (48.081 kg)  SpO2 93%  General: Alert and Oriented, No Acute Distress HEENT: Pupils equal, round, reactive to light. Conjunctivae clear.  External ears unremarkable, canals clear with intact TMs with appropriate landmarks.  Middle ear appears open without effusion. Pink inferior turbinates.  Moist mucous membranes, pharynx without inflammation nor lesions.  Neck supple without palpable lymphadenopathy nor abnormal masses. Lungs: Comfortable work of breathing with distant breath sounds with mild rales in the lower lobes but no wheezing or rhonchi. Cardiac: Regular rate and rhythm. Normal S1/S2.  No murmurs, rubs, nor gallops.   Extremities: No peripheral edema.  Strong peripheral pulses.  Mental Status: No depression, anxiety, nor agitation. Skin: Warm and dry.  Assessment & Plan: Catherine Phillips was seen today for no specified reason.  Diagnoses and associated orders for this  visit:  COPD exacerbation  CAP (community acquired pneumonia)  Leukocytosis - CBC  Interstitial lung disease - Ambulatory referral to Pulmonology  Pulmonary emphysema, unspecified emphysema type - Ambulatory referral to Pulmonology  Pulmonary nodules    Community acquired pneumonia: Resolved COPD exacerbation: Resolved. Joint decision for referral to pulmonology for further management of COPD especially in the setting of her recent interstitial lung disease findings on a CT scan. For now continue albuterol and Spiriva. Discussed that I anticipate she may need to start additional or new  inhaler but we will leave this up to pulmonology. Advised to wear oxygen nightly and with any exertion causes shortness of breath. Leukocytosis: Rechecking white count today, blood cultures were reviewed and negative.  25 minutes spent face-to-face during visit today of which at least 50% was counseling or coordinating care regarding: 1. COPD exacerbation   2. CAP (community acquired pneumonia)   3. Leukocytosis   4. Interstitial lung disease   5. Pulmonary emphysema, unspecified emphysema type   6. Pulmonary nodules       Return in about 3 months (around 11/18/2014).

## 2014-08-27 ENCOUNTER — Encounter (HOSPITAL_COMMUNITY): Payer: Self-pay | Admitting: Vascular Surgery

## 2014-09-21 ENCOUNTER — Ambulatory Visit (INDEPENDENT_AMBULATORY_CARE_PROVIDER_SITE_OTHER): Payer: 59 | Admitting: Family Medicine

## 2014-09-21 ENCOUNTER — Encounter: Payer: Self-pay | Admitting: Family Medicine

## 2014-09-21 VITALS — BP 154/72 | HR 88 | Temp 97.4°F | Wt 108.0 lb

## 2014-09-21 DIAGNOSIS — J329 Chronic sinusitis, unspecified: Secondary | ICD-10-CM

## 2014-09-21 DIAGNOSIS — A499 Bacterial infection, unspecified: Secondary | ICD-10-CM

## 2014-09-21 DIAGNOSIS — B9689 Other specified bacterial agents as the cause of diseases classified elsewhere: Secondary | ICD-10-CM

## 2014-09-21 MED ORDER — CEFDINIR 300 MG PO CAPS: 300.0000 mg | ORAL_CAPSULE | Freq: Two times a day (BID) | ORAL | Status: AC

## 2014-09-21 MED ORDER — NYSTATIN 100000 UNIT/ML MT SUSP: 5.0000 mL | Freq: Four times a day (QID) | OROMUCOSAL | Status: DC

## 2014-09-21 NOTE — Progress Notes (Signed)
CC: Catherine Phillips is a 69 y.o. female is here for Nasal Congestion   Subjective: HPI:  Complains of facial pressure in the left cheek that radiates into the left ear. Pain is described as pressure and fullness. Nothing particularly makes it better or worse. It's been present since early last week. Worsening on a daily basis now moderate in severity. Accompanied by nasal discharge and postnasal drip. Woke up with a cough this morning but otherwise denies any new pulmonary complaints. She denies chest discomfort or shortness of breath however is using oxygen around the clock at home now. Denies fevers, chills, confusion, nor hearing loss.   Review Of Systems Outlined In HPI  Past Medical History  Diagnosis Date  . COPD (chronic obstructive pulmonary disease)   . Hypertension   . Emphysema of lung   . Anxiety 06/24/2012  . Essential hypertension 06/24/2012  . Emphysema 06/24/2012  . History of Left leg claudication 06/24/2012  . Shortness of breath dyspnea     Past Surgical History  Procedure Laterality Date  . Abdominal aortagram  12/09/12  . Abdominal aortagram N/A 12/09/2012    Procedure: ABDOMINAL Maxcine Ham;  Surgeon: Angelia Mould, MD;  Location: [facility] CATH LAB;  Service: Cardiovascular;  Laterality: N/A;   Family History  Problem Relation Age of Onset  . Cancer Mother   . Heart attack Father   . Deep vein thrombosis Father   . Hyperlipidemia Father   . Hypertension Father     History   Social History  . Marital Status: Married    Spouse Name: N/A    Number of Children: N/A  . Years of Education: N/A   Occupational History  . Not on file.   Social History Main Topics  . Smoking status: Former Smoker -- 1.50 packs/day for 30 years    Types: Cigarettes    Quit date: 12/30/2009  . Smokeless tobacco: Never Used  . Alcohol Use: No  . Drug Use: No  . Sexual Activity: Not on file   Other Topics Concern  . Not on file   Social History Narrative      Objective: BP 154/72 mmHg  Pulse 88  Temp(Src) 97.4 F (36.3 C) (Oral)  Wt 108 lb (48.988 kg)  SpO2 94%  General: Alert and Oriented, No Acute Distress HEENT: Pupils equal, round, reactive to light. Conjunctivae clear.  External ears unremarkable other than moderate cerumen in the left ear canal,  intact TMs with appropriate landmarks.  Middle ear appears open without effusion. Pink inferior turbinates.  Moist mucous membranes, pharynx without inflammation nor lesions other than cobblestoning and postnasal drip.  Neck supple without palpable lymphadenopathy nor abnormal masses. Pulm: distant breath sounds with no wheezing rhonchi nor rales. Comfortable work of breathing  Cardiac: Regular rate and rhythm. Normal S1/S2.  No murmurs, rubs, nor gallops.   Extremities: No peripheral edema.  Strong peripheral pulses.  Mental Status: No depression, anxiety, nor agitation. Skin: Warm and dry.  Assessment & Plan: Catherine Phillips was seen today for nasal congestion.  Diagnoses and associated orders for this visit:  Bacterial sinusitis - cefdinir (OMNICEF) 300 MG capsule; Take 1 capsule (300 mg total) by mouth 2 (two) times daily. - nystatin (MYCOSTATIN) 100000 UNIT/ML suspension; Take 5 mLs (500,000 Units total) by mouth 4 (four) times daily.    Bacterial sinusitis: Start omnicef, she was also given nystatin because last time she was on this medication and gave her thrush. Consider nasal saline washes and avoid decongestants.   Return  if symptoms worsen or fail to improve.

## 2014-09-24 ENCOUNTER — Telehealth: Payer: Self-pay

## 2014-09-24 MED ORDER — METHYLPREDNISOLONE (PAK) 4 MG PO TABS: ORAL_TABLET | ORAL | Status: DC

## 2014-09-24 NOTE — Telephone Encounter (Signed)
Patient's husband advised 

## 2014-09-24 NOTE — Telephone Encounter (Signed)
Continue rx of cefdinir and a new Rx of methylprednisolone sent to [facility].

## 2014-09-24 NOTE — Telephone Encounter (Signed)
Patient left a message stating she is not feeling any better and was advised to call back. I returned call and she reports body aches and increased cough, no breathing problems.

## 2014-10-14 ENCOUNTER — Other Ambulatory Visit: Payer: Self-pay | Admitting: Family Medicine

## 2014-10-15 ENCOUNTER — Telehealth: Payer: Self-pay | Admitting: Family Medicine

## 2014-10-15 NOTE — Telephone Encounter (Signed)
Cough worse  since tues night, never resolved from sinus infection. No nasal congestion, no fever. Pt states she is having to use inhaler more frequently. Pt has a sore throat but she thinks that is coming from using the inhaler and oxygen.

## 2014-10-15 NOTE — Telephone Encounter (Signed)
Pt called for a refill on cefdinir.  Her telephone # is, [phone].  Thank you.

## 2014-10-15 NOTE — Telephone Encounter (Signed)
Catherine Phillips you get a summary of her symptoms, I'm ok with sending in something like cefdinir but we should try a different antibiotic based on her symptoms.

## 2014-10-16 ENCOUNTER — Telehealth: Payer: Self-pay | Admitting: Family Medicine

## 2014-10-16 ENCOUNTER — Encounter: Payer: Self-pay | Admitting: Family Medicine

## 2014-10-16 ENCOUNTER — Ambulatory Visit (INDEPENDENT_AMBULATORY_CARE_PROVIDER_SITE_OTHER): Payer: 59 | Admitting: Family Medicine

## 2014-10-16 VITALS — BP 152/73 | HR 79 | Temp 97.5°F | Wt 108.0 lb

## 2014-10-16 DIAGNOSIS — J441 Chronic obstructive pulmonary disease with (acute) exacerbation: Secondary | ICD-10-CM

## 2014-10-16 DIAGNOSIS — J439 Emphysema, unspecified: Secondary | ICD-10-CM

## 2014-10-16 MED ORDER — PREDNISONE 20 MG PO TABS: ORAL_TABLET | ORAL | Status: DC

## 2014-10-16 MED ORDER — AMOXICILLIN-POT CLAVULANATE 500-125 MG PO TABS: ORAL_TABLET | ORAL | Status: AC

## 2014-10-16 NOTE — Progress Notes (Signed)
CC: Catherine Phillips is a 69 y.o. female is here for Cough and Sore Throat   Subjective: HPI:  Nonproductive cough and shortness of breath that has been worsening over the past 2 days. She believes that her shortness of breath is significantly below her baseline shortness of breath and same goes for the cough. She denies blood in the sputum or chest pain. Other than a sore throat she feels like she is in irregular state of health. She is having to use albuterol more often than she is used to up to 4 times a day now. She is wearing her oxygen 24 hours a day when usually she just wears this while sleeping and while active. Other than above nothing seems to make symptoms better or worse. Review of systems is positive for wheezing beyond baseline. Denies fevers, chills, chest pain, back pain, confusion, facial pressure, nasal congestion, edema nor rapid heartbeat. Albuterol resolves all of the above symptoms other than sore throat however only for a few hours.   Review Of Systems Outlined In HPI  Past Medical History  Diagnosis Date  . COPD (chronic obstructive pulmonary disease)   . Hypertension   . Emphysema of lung   . Anxiety 06/24/2012  . Essential hypertension 06/24/2012  . Emphysema 06/24/2012  . History of Left leg claudication 06/24/2012  . Shortness of breath dyspnea     Past Surgical History  Procedure Laterality Date  . Abdominal aortagram  12/09/12  . Abdominal aortagram N/A 12/09/2012    Procedure: ABDOMINAL Maxcine Ham;  Surgeon: Angelia Mould, MD;  Location: [facility] CATH LAB;  Service: Cardiovascular;  Laterality: N/A;   Family History  Problem Relation Age of Onset  . Cancer Mother   . Heart attack Father   . Deep vein thrombosis Father   . Hyperlipidemia Father   . Hypertension Father     History   Social History  . Marital Status: Married    Spouse Name: N/A    Number of Children: N/A  . Years of Education: N/A   Occupational History  . Not on file.   Social  History Main Topics  . Smoking status: Former Smoker -- 1.50 packs/day for 30 years    Types: Cigarettes    Quit date: 12/30/2009  . Smokeless tobacco: Never Used  . Alcohol Use: No  . Drug Use: No  . Sexual Activity: Not on file   Other Topics Concern  . Not on file   Social History Narrative     Objective: BP 152/73 mmHg  Pulse 79  Temp(Src) 97.5 F (36.4 C) (Oral)  Wt 108 lb (48.988 kg)  SpO2 86%  General: Alert and Oriented, No Acute Distress HEENT: Pupils equal, round, reactive to light. Conjunctivae clear.  External ears unremarkable, canals clear with intact TMs with appropriate landmarks.  Middle ear appears open without effusion. Pink inferior turbinates.  Moist mucous membranes, pharynx without inflammation nor lesions.  Neck supple without palpable lymphadenopathy nor abnormal masses. Lungs:comfortable work of breathing with distant breath sounds trace wheezing and no rhonchi or rales. Cardiac: Regular rate and rhythm. Normal S1/S2.  No murmurs, rubs, nor gallops.   Extremities: No peripheral edema.  Strong peripheral pulses.  Mental Status: No depression, anxiety, nor agitation. Skin: Warm and dry.  Assessment & Plan: [name] was seen today for cough and sore throat.  Diagnoses and associated orders for this visit:  COPD exacerbation - amoxicillin-clavulanate (AUGMENTIN) 500-125 MG per tablet; Take one by mouth every 8 hours for ten  total days. - predniSONE (DELTASONE) 20 MG tablet; Three tabs daily days 1-3, two tabs daily days 4-6, one tab daily days 7-9, half tab daily days 10-13.    COPD exacerbation: She's been on a variety of antibiotics recently and in hopes of avoiding resistance will see if Augmentin is beneficial for her along with prednisone. She tells me that the Medrol Dosepak that was provided to her earlier this month did not provide her with nearly the samepulmonary response as prednisone usually does. Oxygen saturation in the office was above 90  on 2 L of oxygen per minute. She's been using this at home.  Suanne Marker look into whether or not her respiratory supply company can provide her with an oxygen concentrator that has humidification capabilities.   Return if symptoms worsen or fail to improve.

## 2014-10-16 NOTE — Telephone Encounter (Signed)
Catherine Phillips, Can you get in touch with [name] in [place] (or any other local office) and see if they're servicing [name] for oxygen.  If so is it possible for them to provide her with an oxygen concentrator that has a humidification capability.  She is under the impression that her current concentrator does not have this capability.

## 2014-10-19 ENCOUNTER — Other Ambulatory Visit: Payer: Self-pay | Admitting: Family Medicine

## 2014-10-21 MED ORDER — AMBULATORY NON FORMULARY MEDICATION: Status: DC

## 2014-10-21 NOTE — Telephone Encounter (Signed)
Faxed to [phone]

## 2014-10-29 ENCOUNTER — Encounter: Payer: Self-pay | Admitting: Critical Care Medicine

## 2014-10-29 ENCOUNTER — Ambulatory Visit (INDEPENDENT_AMBULATORY_CARE_PROVIDER_SITE_OTHER): Payer: 59 | Admitting: Critical Care Medicine

## 2014-10-29 VITALS — BP 148/78 | HR 69 | Temp 98.1°F | Ht 62.0 in | Wt 109.0 lb

## 2014-10-29 DIAGNOSIS — J439 Emphysema, unspecified: Secondary | ICD-10-CM

## 2014-10-29 DIAGNOSIS — R0609 Other forms of dyspnea: Secondary | ICD-10-CM

## 2014-10-29 DIAGNOSIS — Z23 Encounter for immunization: Secondary | ICD-10-CM

## 2014-10-29 NOTE — Progress Notes (Addendum)
Subjective:    Patient ID: Catherine Phillips, female    DOB: 1945/10/24, 69 y.o.   MRN: [id number]  HPI Comments: Dx copd.  Hx of ?ILD.  Pt quit smoking 4 yrs ago.  No real chest pain.  Doing well. Has oxygen prn, occ qhs 2L.  No real mucus. occ bronchitis but not frequent.  Pt did have an URI 09/2014.  Rx abx for same also pred pulse   Shortness of Breath This is a chronic problem. The current episode started more than 1 year ago. The problem occurs daily (going up incline or up stairs.  ). The problem has been unchanged. Pertinent negatives include no abdominal pain, chest pain, coryza, ear pain, fever, headaches, hemoptysis, leg pain, leg swelling, neck pain, orthopnea, PND, rash, rhinorrhea, sore throat, sputum production, swollen glands, syncope, vomiting or wheezing. The symptoms are aggravated by URIs, weather changes, eating, exercise, odors and pollens. Risk factors include smoking. She has tried beta agonist inhalers and ipratropium inhalers for the symptoms. The treatment provided moderate relief. Her past medical history is significant for COPD and pneumonia. There is no history of allergies, aspirin allergies, asthma, bronchiolitis, CAD, DVT, a heart failure, PE or a recent surgery.   CAT Score 10/29/2014 10/29/2014  Total CAT Score 9 10       Past Medical History  Diagnosis Date  . COPD (chronic obstructive pulmonary disease)   . Hypertension   . Emphysema of lung   . Anxiety 06/24/2012  . Essential hypertension 06/24/2012  . Emphysema 06/24/2012  . History of Left leg claudication 06/24/2012  . Shortness of breath dyspnea      Family History  Problem Relation Age of Onset  . Cancer Mother   . Heart attack Father   . Deep vein thrombosis Father   . Hyperlipidemia Father   . Hypertension Father      History   Social History  . Marital Status: Married    Spouse Name: N/A  . Number of Children: N/A  . Years of Education: N/A   Occupational History  . Not on file.    Social History Main Topics  . Smoking status: Former Smoker -- 1.50 packs/day for 30 years    Types: Cigarettes    Quit date: 12/30/2009  . Smokeless tobacco: Never Used  . Alcohol Use: No  . Drug Use: No  . Sexual Activity: Not on file   Other Topics Concern  . Not on file   Social History Narrative     Allergies  Allergen Reactions  . Codeine Nausea And Vomiting  . Levaquin [Levofloxacin In D5w] Itching and Rash     Outpatient Prescriptions Prior to Visit  Medication Sig Dispense Refill  . albuterol (PROVENTIL) (2.5 MG/3ML) 0.083% nebulizer solution Take 3 mLs (2.5 mg total) by nebulization every 4 (four) hours as needed for wheezing or shortness of breath. 225 mL 12  . AMBULATORY NON FORMULARY MEDICATION Oxygen concentrator that has humidification capability Dx:J43.4 1 each 0  . amLODipine (NORVASC) 10 MG tablet TAKE 1 TABLET EVERY DAY 90 tablet 0  . aspirin EC 325 MG tablet Take 1 tablet (325 mg total) by mouth daily. 100 tablet 3  . calcium carbonate (TUMS) 500 MG chewable tablet Chew 1 tablet by mouth daily as needed for heartburn.    . hydrochlorothiazide (HYDRODIURIL) 25 MG tablet TAKE 1 TABLET EVERY DAY 90 tablet 3  . Pitavastatin Calcium 2 MG TABS Take 1 tablet (2 mg total) by mouth every other  day.    . pregabalin (LYRICA) 50 MG capsule Take 1 capsule (50 mg total) by mouth 3 (three) times daily. (Patient taking differently: Take 50 mg by mouth daily. ) 270 capsule 1  . tiotropium (SPIRIVA) 18 MCG inhalation capsule Place 1 capsule (18 mcg total) into inhaler and inhale daily. 30 capsule 11  . Calcium Carbonate-Vitamin D (CALCIUM-VITAMIN D) 500-200 MG-UNIT per tablet Take 1 tablet by mouth daily.    . methylPREDNIsolone (MEDROL DOSPACK) 4 MG tablet follow package directions (Patient not taking: Reported on 10/29/2014) 21 tablet 0  . predniSONE (DELTASONE) 20 MG tablet Three tabs daily days 1-3, two tabs daily days 4-6, one tab daily days 7-9, half tab daily days  10-13. (Patient not taking: Reported on 10/29/2014) 20 tablet 0   No facility-administered medications prior to visit.      Review of Systems  Constitutional: Negative for fever.  HENT: Negative for ear pain, rhinorrhea and sore throat.   Respiratory: Positive for shortness of breath. Negative for hemoptysis, sputum production and wheezing.   Cardiovascular: Negative for chest pain, orthopnea, leg swelling, syncope and PND.  Gastrointestinal: Negative for vomiting and abdominal pain.  Musculoskeletal: Negative for neck pain.  Skin: Negative for rash.  Neurological: Negative for headaches.       Objective:   Physical Exam Filed Vitals:   10/29/14 0914  BP: 148/78  Pulse: 69  Temp: 98.1 F (36.7 C)  TempSrc: Oral  Height: 5\' 2"  (1.575 m)  Weight: 109 lb (49.442 kg)  SpO2: 93%    Gen: Pleasant, well-nourished, in no distress,  normal affect  ENT: No lesions,  mouth clear,  oropharynx clear, no postnasal drip  Neck: No JVD, no TMG, no carotid bruits  Lungs: No use of accessory muscles, no dullness to percussion, distant BS  Cardiovascular: RRR, heart sounds normal, no murmur or gallops, no peripheral edema  Abdomen: soft and NT, no HSM,  BS normal  Musculoskeletal: No deformities, no cyanosis or clubbing  Neuro: alert, non focal  Skin: Warm, no lesions or rashes  No results found.   Arlyce Harman 10/29/14: FeV1 60% FeV1/FVC 52%      Assessment & Plan:   COPD (chronic obstructive pulmonary disease) with emphysema  Gold B Copd with emphysema stable at present  Plan  prevnar 13 vaccine Cont oxygen at night No change in inhaled meds, cont spiriva rov 6 months    Updated Medication List Outpatient Encounter Prescriptions as of 10/29/2014  Medication Sig  . albuterol (PROVENTIL) (2.5 MG/3ML) 0.083% nebulizer solution Take 3 mLs (2.5 mg total) by nebulization every 4 (four) hours as needed for wheezing or shortness of breath.  . AMBULATORY NON FORMULARY MEDICATION  Oxygen concentrator that has humidification capability Dx:J43.4  . amLODipine (NORVASC) 10 MG tablet TAKE 1 TABLET EVERY DAY  . aspirin EC 325 MG tablet Take 1 tablet (325 mg total) by mouth daily.  . calcium carbonate (TUMS) 500 MG chewable tablet Chew 1 tablet by mouth daily as needed for heartburn.  . hydrochlorothiazide (HYDRODIURIL) 25 MG tablet TAKE 1 TABLET EVERY DAY  . Pitavastatin Calcium 2 MG TABS Take 1 tablet (2 mg total) by mouth every other day.  . pregabalin (LYRICA) 50 MG capsule Take 1 capsule (50 mg total) by mouth 3 (three) times daily. (Patient taking differently: Take 50 mg by mouth daily. )  . tiotropium (SPIRIVA) 18 MCG inhalation capsule Place 1 capsule (18 mcg total) into inhaler and inhale daily.  . [DISCONTINUED] Calcium Carbonate-Vitamin D (  CALCIUM-VITAMIN D) 500-200 MG-UNIT per tablet Take 1 tablet by mouth daily.  . [DISCONTINUED] methylPREDNIsolone (MEDROL DOSPACK) 4 MG tablet follow package directions (Patient not taking: Reported on 10/29/2014)  . [DISCONTINUED] predniSONE (DELTASONE) 20 MG tablet Three tabs daily days 1-3, two tabs daily days 4-6, one tab daily days 7-9, half tab daily days 10-13. (Patient not taking: Reported on 10/29/2014)

## 2014-10-29 NOTE — Patient Instructions (Signed)
No change in medications. Return in         6 months A prevnar 13 vaccine was given Use your oxygen 2L every night

## 2014-10-30 NOTE — Assessment & Plan Note (Signed)
Copd with emphysema stable at present  Plan  prevnar 13 vaccine Cont oxygen at night No change in inhaled meds rov 6 months

## 2014-12-03 ENCOUNTER — Ambulatory Visit (INDEPENDENT_AMBULATORY_CARE_PROVIDER_SITE_OTHER): Payer: 59 | Admitting: Family Medicine

## 2014-12-03 ENCOUNTER — Encounter: Payer: Self-pay | Admitting: Family Medicine

## 2014-12-03 VITALS — BP 159/82 | HR 75 | Temp 98.0°F | Wt 108.0 lb

## 2014-12-03 DIAGNOSIS — F341 Dysthymic disorder: Secondary | ICD-10-CM

## 2014-12-03 DIAGNOSIS — K2970 Gastritis, unspecified, without bleeding: Secondary | ICD-10-CM

## 2014-12-03 DIAGNOSIS — R4589 Other symptoms and signs involving emotional state: Secondary | ICD-10-CM

## 2014-12-03 MED ORDER — PANTOPRAZOLE SODIUM 40 MG PO TBEC: 40.0000 mg | DELAYED_RELEASE_TABLET | Freq: Every day | ORAL | Status: DC

## 2014-12-03 NOTE — Progress Notes (Signed)
CC: Catherine Phillips is a 69 y.o. female is here for Abdominal Pain   Subjective: HPI:  Epigastric discomfort that has been present for the past 2 weeks on a daily basis mild to moderate severity present any hour of the day but not awaking her from sleep. Worse after eating anything however will go away within a few minutes after taking a Tums. Pain radiates into the back. There is no positional component to pain. She said it feels like when she's had an ulcer in the past that this was decades ago. Symptoms also seem to be worse when she is facing the reality of her brother who is about to die from cardiovascular issues.  She denies any decreased appetite, acid sensation in the back of the mouth, radiation of her pain into the lower abdomen or pelvis. She denies any unintentional weight loss, nausea or vomiting. No fevers or chills nor dark stool.  Complains of sadness with facing the fact that her brother is probably been a diet next week or 2. Symptoms are improved the more she is at work and distracted. Symptoms are moderate in severity and present on a daily basis for the past week. She tells me that is interfering with her quality of life and that she's lost interest in all hobbies because she is constantly thinking about her brother. She denies thoughts of wanting to harm herself or others   Review Of Systems Outlined In HPI  Past Medical History  Diagnosis Date  . COPD (chronic obstructive pulmonary disease)   . Hypertension   . Emphysema of lung   . Anxiety 06/24/2012  . Essential hypertension 06/24/2012  . Emphysema 06/24/2012  . History of Left leg claudication 06/24/2012  . Shortness of breath dyspnea     Past Surgical History  Procedure Laterality Date  . Abdominal aortagram  12/09/12  . Abdominal aortagram N/A 12/09/2012    Procedure: ABDOMINAL Maxcine Ham;  Surgeon: Angelia Mould, MD;  Location: [facility] CATH LAB;  Service: Cardiovascular;  Laterality: N/A;   Family History   Problem Relation Age of Onset  . Cancer Mother   . Heart attack Father   . Deep vein thrombosis Father   . Hyperlipidemia Father   . Hypertension Father     History   Social History  . Marital Status: Married    Spouse Name: N/A  . Number of Children: N/A  . Years of Education: N/A   Occupational History  . Not on file.   Social History Main Topics  . Smoking status: Former Smoker -- 1.50 packs/day for 30 years    Types: Cigarettes    Quit date: 12/30/2009  . Smokeless tobacco: Never Used  . Alcohol Use: No  . Drug Use: No  . Sexual Activity: Not on file   Other Topics Concern  . Not on file   Social History Narrative     Objective: BP 159/82 mmHg  Pulse 75  Temp(Src) 98 F (36.7 C) (Oral)  Wt 108 lb (48.988 kg)  General: Alert and Oriented, No Acute Distress HEENT: Pupils equal, round, reactive to light. Conjunctivae clear.  Moist mucous membranes Unremarkable Lungs: Clear to auscultation bilaterally, no wheezing/ronchi/rales.  Comfortable work of breathing. Good air movement. Cardiac: Regular rate and rhythm. Normal S1/S2.  No murmurs, rubs, nor gallops.   Abdomen: Normal bowel sounds, soft and non tender without palpable masses. Extremities: No peripheral edema.  Strong peripheral pulses.  Mental Status: Mild sadness but no anxiety or agitation. Mild tearfulness  when talking about her brother Skin: Warm and dry.  Assessment & Plan: Catherine Phillips was seen today for abdominal pain.  Diagnoses and all orders for this visit:  Gastritis  Sadness  Other orders -     pantoprazole (PROTONIX) 40 MG tablet; Take 1 tablet (40 mg total) by mouth daily. Only until mid June   Gastritis: Possible peptic or gastric ulcer given radiation of pain in the back, suspicion for pancreatitis is low given response to Tums and lack of nausea. Start Protonix for the next 3 months. Call if no better after 1 week continue to take Tums. Avoid all nonsteroidal anti-inflammatories and  alcohol. Sadness: Counseling was provided regarding healthy ways to approach the upcoming death of a loved one. Discussed the natural grieving process and ways to stay close with family to help with sadness and grief.  25 minutes spent face-to-face during visit today of which at least 50% was counseling or coordinating care regarding: 1. Gastritis   2. Sadness       Return if symptoms worsen or fail to improve.

## 2014-12-28 ENCOUNTER — Other Ambulatory Visit: Payer: Self-pay | Admitting: Family Medicine

## 2015-01-14 ENCOUNTER — Other Ambulatory Visit: Payer: Self-pay | Admitting: Family Medicine

## 2015-01-15 ENCOUNTER — Other Ambulatory Visit: Payer: Self-pay | Admitting: Family Medicine

## 2015-01-18 ENCOUNTER — Ambulatory Visit (INDEPENDENT_AMBULATORY_CARE_PROVIDER_SITE_OTHER): Payer: 59 | Admitting: Family Medicine

## 2015-01-18 ENCOUNTER — Encounter: Payer: Self-pay | Admitting: Family Medicine

## 2015-01-18 VITALS — BP 140/72 | HR 70 | Temp 98.5°F | Wt 110.0 lb

## 2015-01-18 DIAGNOSIS — M19042 Primary osteoarthritis, left hand: Secondary | ICD-10-CM

## 2015-01-18 DIAGNOSIS — H6092 Unspecified otitis externa, left ear: Secondary | ICD-10-CM

## 2015-01-18 DIAGNOSIS — M19041 Primary osteoarthritis, right hand: Secondary | ICD-10-CM | POA: Diagnosis not present

## 2015-01-18 DIAGNOSIS — H6122 Impacted cerumen, left ear: Secondary | ICD-10-CM

## 2015-01-18 DIAGNOSIS — B370 Candidal stomatitis: Secondary | ICD-10-CM

## 2015-01-18 MED ORDER — CELECOXIB 100 MG PO CAPS: 100.0000 mg | ORAL_CAPSULE | Freq: Two times a day (BID) | ORAL | Status: DC | PRN

## 2015-01-18 MED ORDER — FLUCONAZOLE 150 MG PO TABS: 150.0000 mg | ORAL_TABLET | Freq: Every day | ORAL | Status: DC | PRN

## 2015-01-18 MED ORDER — NEOMYCIN-POLYMYXIN-HC 1 % OT SOLN: OTIC | Status: AC

## 2015-01-18 NOTE — Progress Notes (Signed)
CC: Catherine Phillips is a 69 y.o. female is here for left ear pain   Subjective: HPI:  Has been using nystatin mouthwash in the past for treatment of thrush. She tells me that one or 2 times a month she will develop a white film that is somewhat painful on her tongue and the back of the throat. Symptoms are worse when she does not wash her mouth out after using Spiriva. She is currently not on any inhaled corticosteroids but symptoms are worse when she uses prednisone. She wantsa pill that she can take in the future instead of using mouthwash.  Complains of bilateral hand pain on a daily basis mild to moderate severity worse first in the morning. Worse after periods of inactivity. It localized mostly to the knuckles. It improves greatly after she is "warmed up" for about 30 minutes and only returns after she starts resting again. There's been no swelling redness or warmth of any of these knuckles. She denies any recent trauma or overexertion. Mild benefit from Tylenol. She would like another something stronger per intake.  Complains of left ear pain that's been present off and on for the last week. Accompanied by decreased hearing. Anus described as a pressure that radiates just behind the ear. Worse with any manipulation of the ear. Denies nasal congestion headaches fevers, chills, nor any other motor or sensory disturbances. No discharge. No interventions as of yet  Review Of Systems Outlined In HPI  Past Medical History  Diagnosis Date  . COPD (chronic obstructive pulmonary disease)   . Hypertension   . Emphysema of lung   . Anxiety 06/24/2012  . Essential hypertension 06/24/2012  . Emphysema 06/24/2012  . History of Left leg claudication 06/24/2012  . Shortness of breath dyspnea     Past Surgical History  Procedure Laterality Date  . Abdominal aortagram  12/09/12  . Abdominal aortagram N/A 12/09/2012    Procedure: ABDOMINAL Maxcine Ham;  Surgeon: Angelia Mould, MD;  Location: [facility] CATH  LAB;  Service: Cardiovascular;  Laterality: N/A;   Family History  Problem Relation Age of Onset  . Cancer Mother   . Heart attack Father   . Deep vein thrombosis Father   . Hyperlipidemia Father   . Hypertension Father     History   Social History  . Marital Status: Married    Spouse Name: N/A  . Number of Children: N/A  . Years of Education: N/A   Occupational History  . Not on file.   Social History Main Topics  . Smoking status: Former Smoker -- 1.50 packs/day for 30 years    Types: Cigarettes    Quit date: 12/30/2009  . Smokeless tobacco: Never Used  . Alcohol Use: No  . Drug Use: No  . Sexual Activity: Not on file   Other Topics Concern  . Not on file   Social History Narrative     Objective: BP 140/72 mmHg  Pulse 70  Temp(Src) 98.5 F (36.9 C)  Wt 110 lb (49.896 kg)  General: Alert and Oriented, No Acute Distress HEENT: Pupils equal, round, reactive to light. Conjunctivae clear.  External ears unremarkable, right canal clear with intact TMs with appropriate landmarks.  Cerumen impaction initially with over 50% removal with irrigation showing slightly erythematous and edematous external canal the left. Middle ear appears open without effusion. Pink inferior turbinates.  Moist mucous membranes, pharynx without inflammation nor lesions.  Neck supple without palpable lymphadenopathy nor abnormal masses. Lungs: Clear comfortable work of breathing Cardiac:  Regular rate and rhythm. Extremities: No peripheral edema.  Strong peripheral pulses. Full range of motion and strength of both upper extremities with no swelling redness or warmth of any of the joints in the hands. Mental Status: No depression, anxiety, nor agitation. Skin: Warm and dry.  Assessment & Plan: Catherine Phillips was seen today for left ear pain.  Diagnoses and all orders for this visit:  Thrush Orders: -     fluconazole (DIFLUCAN) 150 MG tablet; Take 1 tablet (150 mg total) by mouth daily as needed  (thrush).  Primary osteoarthritis of both hands Orders: -     celecoxib (CELEBREX) 100 MG capsule; Take 1 capsule (100 mg total) by mouth 2 (two) times daily as needed (arthritis).  Cerumen impaction, left  Left otitis externa Orders: -     NEOMYCIN-POLYMYXIN-HYDROCORTISONE (CORTISPORIN) 1 % SOLN otic solution; Four drops in affected ear(s) three times a day, keep in ear(s) for five minutes. Total of ten days.   Thrush: Controlled no current indication to start fluconazole but given her prescription for should she have any return of symptoms of thrush. Osteoarthritis: Begin Celebrex urged to avoid nonsteroidal anti-inflammatories over-the-counter since this can reduce her kidney function Cerumen impaction: After successful removal she was without pain but there is a sign of mild otitis externa therefore start Cortisporin  Indication: Cerumen impaction of the left ear. Medical necessity statement: On physical examination, cerumen impairs clinically significant portions of the external auditory canal, and tympanic membrane. Noted obstructive, copious cerumen that cannot be removed without magnification and instrumentations requiring physician skills Consent: Discussed benefits and risks of procedure and verbal consent obtained Procedure: Patient was prepped for the procedure. Utilized an otoscope to assess and take note of the ear canal, the tympanic membrane, and the presence, amount, and placement of the cerumen. Gentle water irrigation and soft plastic curette was utilized to remove cerumen.  Post procedure examination: shows cerumen was completely removed. Patient tolerated procedure well. The patient is made aware that they may experience temporary vertigo, temporary hearing loss, and temporary discomfort. If these symptom last for more than 24 hours to call the clinic or proceed to the ED.  [phone]  Modifier 25  No Follow-up on file.

## 2015-03-15 ENCOUNTER — Other Ambulatory Visit: Payer: Self-pay | Admitting: *Deleted

## 2015-03-15 MED ORDER — PANTOPRAZOLE SODIUM 40 MG PO TBEC: 40.0000 mg | DELAYED_RELEASE_TABLET | Freq: Every day | ORAL | Status: DC

## 2015-03-15 NOTE — Progress Notes (Signed)
Insurance requires a 90 day supply.

## 2015-04-08 ENCOUNTER — Other Ambulatory Visit: Payer: Self-pay | Admitting: Family Medicine

## 2015-04-13 ENCOUNTER — Other Ambulatory Visit: Payer: Self-pay | Admitting: *Deleted

## 2015-04-13 MED ORDER — PITAVASTATIN CALCIUM 2 MG PO TABS: 2.0000 mg | ORAL_TABLET | ORAL | Status: DC

## 2015-04-19 ENCOUNTER — Encounter: Payer: Self-pay | Admitting: Family Medicine

## 2015-04-19 ENCOUNTER — Ambulatory Visit (INDEPENDENT_AMBULATORY_CARE_PROVIDER_SITE_OTHER): Payer: 59 | Admitting: Family Medicine

## 2015-04-19 VITALS — BP 152/78 | HR 80 | Wt 110.0 lb

## 2015-04-19 DIAGNOSIS — I10 Essential (primary) hypertension: Secondary | ICD-10-CM

## 2015-04-19 DIAGNOSIS — M19049 Primary osteoarthritis, unspecified hand: Secondary | ICD-10-CM | POA: Diagnosis not present

## 2015-04-19 DIAGNOSIS — E785 Hyperlipidemia, unspecified: Secondary | ICD-10-CM | POA: Diagnosis not present

## 2015-04-19 LAB — LIPID PANEL
CHOLESTEROL: 191 mg/dL (ref 125–200)
HDL: 59 mg/dL (ref >=46)
LDL Cholesterol: 102 mg/dL (ref <130)
Total CHOL/HDL Ratio: 3.2 Ratio (ref <=5.0)
Triglycerides: 152 mg/dL — ABNORMAL HIGH (ref <150)
VLDL: 30 mg/dL (ref <30)

## 2015-04-19 MED ORDER — LISINOPRIL 10 MG PO TABS: 10.0000 mg | ORAL_TABLET | Freq: Every day | ORAL | Status: DC

## 2015-04-19 NOTE — Progress Notes (Signed)
CC: Catherine Phillips is a 69 y.o. female is here for f/u cholesterol   Subjective: HPI:  Follow-up essential hypertension: Continues to take hydrochlorothiazide on a daily basis along with amlodipine. No outside blood pressures report. She tells me she has to wake up twice in the night to urinate. She also feels that she is urinating more frequently than her peers when awake. She denies any dysuria, hematuria, no urgency or accidents. No chest pain shortness of breath or peripheral edema  Follow-up hyperlipidemia: She is currently taking Livalo every other day. She denies side effects but it is expensive. Denies right upper quadrant pain or limb claudication nor myalgias   follow-up osteoporosis arthritis of the hands: She tells me that Celebrex is providing her tremendous resolution for a full day of her bilateral hand pain. Pain is still worse first thing in the morning slightly improved after activity. Symptoms are absent when taking Celebrex. denies swelling redness warmth of any aspect of either hand.  Review Of Systems Outlined In HPI  Past Medical History  Diagnosis Date  . COPD (chronic obstructive pulmonary disease)   . Hypertension   . Emphysema of lung   . Anxiety 06/24/2012  . Essential hypertension 06/24/2012  . Emphysema 06/24/2012  . History of Left leg claudication 06/24/2012  . Shortness of breath dyspnea     Past Surgical History  Procedure Laterality Date  . Abdominal aortagram  12/09/12  . Abdominal aortagram N/A 12/09/2012    Procedure: ABDOMINAL Maxcine Ham;  Surgeon: Angelia Mould, MD;  Location: [facility] CATH LAB;  Service: Cardiovascular;  Laterality: N/A;   Family History  Problem Relation Age of Onset  . Cancer Mother   . Heart attack Father   . Deep vein thrombosis Father   . Hyperlipidemia Father   . Hypertension Father     History   Social History  . Marital Status: Married    Spouse Name: N/A  . Number of Children: N/A  . Years of Education: N/A    Occupational History  . Not on file.   Social History Main Topics  . Smoking status: Former Smoker -- 1.50 packs/day for 30 years    Types: Cigarettes    Quit date: 12/30/2009  . Smokeless tobacco: Never Used  . Alcohol Use: No  . Drug Use: No  . Sexual Activity: Not on file   Other Topics Concern  . Not on file   Social History Narrative     Objective: BP 152/78 mmHg  Pulse 80  Wt 110 lb (49.896 kg)  General: Alert and Oriented, No Acute Distress HEENT: Pupils equal, round, reactive to light. Conjunctivae clear.  Moist mucous membranes  Lungs: Clear to auscultation bilaterally, no wheezing/ronchi/rales.  Comfortable work of breathing. Good air movement. Cardiac: Regular rate and rhythm. Normal S1/S2.  No murmurs, rubs, nor gallops.   Extremities: No peripheral edema.  Strong peripheral pulses.  Mental Status: No depression, anxiety, nor agitation. Skin: Warm and dry.  Assessment & Plan: Arijana was seen today for f/u cholesterol.  Diagnoses and all orders for this visit:  Essential hypertension Orders: -     lisinopril (PRINIVIL,ZESTRIL) 10 MG tablet; Take 1 tablet (10 mg total) by mouth daily.  Hyperlipidemia LDL goal <70 Orders: -     Lipid panel  Osteoarthritis of hand, unspecified laterality, unspecified osteoarthritis type   Essential hypertension: Uncontrolled stopping hydrochlorothiazide given urinary complaints, start lisinopril and continue amlodipine Hyperlipidemia: Due for lipid panel, quickly controlled continue with a low pending results  Chronic osteoarthritis of the hands: Controlled now with as needed Celebrex.   No Follow-up on file.

## 2015-04-20 ENCOUNTER — Telehealth: Payer: Self-pay | Admitting: Family Medicine

## 2015-04-20 DIAGNOSIS — E785 Hyperlipidemia, unspecified: Secondary | ICD-10-CM

## 2015-04-20 MED ORDER — PITAVASTATIN CALCIUM 4 MG PO TABS: 4.0000 mg | ORAL_TABLET | ORAL | Status: DC

## 2015-04-20 NOTE — Telephone Encounter (Signed)
Pt.notified

## 2015-04-20 NOTE — Telephone Encounter (Signed)
Seth Bake, Will you please let patient know that her LDL cholesterol has risen over the past year into the uncontrolled range.  I'd recommend she increase her every other day dose of Livalo.  The new dose will be 4mg  instead of 2mg .  A new Rx was sent to her [facility]. We'll want to recheck this in three months.

## 2015-04-23 ENCOUNTER — Telehealth: Payer: Self-pay | Admitting: *Deleted

## 2015-04-23 NOTE — Telephone Encounter (Signed)
Pt's husband called stating that since Balta started the lisinopril she's been feeling weak, crappy, and occasional lightheadedness.  Is this a common effect from the lisinopril? Does she need to get adjusted to it?  They don't have a machine at home to check BPs.  Please advise.

## 2015-04-23 NOTE — Telephone Encounter (Signed)
Pt's husband notified to have Catherine Phillips only take a half dose of her lisinopril daily.

## 2015-04-23 NOTE — Telephone Encounter (Signed)
Cut down to only half a dose of lisinopril daily and call back if symptoms persist, these are very common side effects if the dose is too high

## 2015-05-02 ENCOUNTER — Observation Stay (HOSPITAL_BASED_OUTPATIENT_CLINIC_OR_DEPARTMENT_OTHER)
Admission: EM | Admit: 2015-05-02 | Discharge: 2015-05-03 | DRG: 191 | Disposition: A | Discharge status: Home / Self Care | Payer: 59 | Attending: Internal Medicine | Admitting: Internal Medicine

## 2015-05-02 ENCOUNTER — Encounter (HOSPITAL_BASED_OUTPATIENT_CLINIC_OR_DEPARTMENT_OTHER): Payer: Self-pay | Admitting: *Deleted

## 2015-05-02 ENCOUNTER — Emergency Department (HOSPITAL_BASED_OUTPATIENT_CLINIC_OR_DEPARTMENT_OTHER): Payer: 59

## 2015-05-02 DIAGNOSIS — Z885 Allergy status to narcotic agent status: Secondary | ICD-10-CM

## 2015-05-02 DIAGNOSIS — Z79899 Other long term (current) drug therapy: Secondary | ICD-10-CM

## 2015-05-02 DIAGNOSIS — K219 Gastro-esophageal reflux disease without esophagitis: Secondary | ICD-10-CM | POA: Diagnosis not present

## 2015-05-02 DIAGNOSIS — Z881 Allergy status to other antibiotic agents status: Secondary | ICD-10-CM

## 2015-05-02 DIAGNOSIS — E860 Dehydration: Secondary | ICD-10-CM | POA: Diagnosis not present

## 2015-05-02 DIAGNOSIS — Z87891 Personal history of nicotine dependence: Secondary | ICD-10-CM

## 2015-05-02 DIAGNOSIS — J441 Chronic obstructive pulmonary disease with (acute) exacerbation: Principal | ICD-10-CM | POA: Diagnosis present

## 2015-05-02 DIAGNOSIS — F419 Anxiety disorder, unspecified: Secondary | ICD-10-CM | POA: Diagnosis present

## 2015-05-02 DIAGNOSIS — R0602 Shortness of breath: Secondary | ICD-10-CM | POA: Diagnosis present

## 2015-05-02 DIAGNOSIS — D72829 Elevated white blood cell count, unspecified: Secondary | ICD-10-CM

## 2015-05-02 DIAGNOSIS — Z8249 Family history of ischemic heart disease and other diseases of the circulatory system: Secondary | ICD-10-CM | POA: Diagnosis not present

## 2015-05-02 DIAGNOSIS — N179 Acute kidney failure, unspecified: Secondary | ICD-10-CM

## 2015-05-02 DIAGNOSIS — E785 Hyperlipidemia, unspecified: Secondary | ICD-10-CM

## 2015-05-02 DIAGNOSIS — J449 Chronic obstructive pulmonary disease, unspecified: Secondary | ICD-10-CM | POA: Diagnosis present

## 2015-05-02 DIAGNOSIS — Z9981 Dependence on supplemental oxygen: Secondary | ICD-10-CM | POA: Diagnosis not present

## 2015-05-02 DIAGNOSIS — J9610 Chronic respiratory failure, unspecified whether with hypoxia or hypercapnia: Secondary | ICD-10-CM | POA: Diagnosis present

## 2015-05-02 DIAGNOSIS — I10 Essential (primary) hypertension: Secondary | ICD-10-CM | POA: Diagnosis not present

## 2015-05-02 LAB — TROPONIN I
Troponin I: <0.03 ng/mL (ref <0.031)
Troponin I: <0.03 ng/mL (ref <0.031)

## 2015-05-02 LAB — CBC WITH DIFFERENTIAL/PLATELET
BASOS PCT: 0 % (ref 0–1)
Basophils Absolute: 0 10*3/uL (ref 0.0–0.1)
EOS ABS: 2.5 10*3/uL — AB (ref 0.0–0.7)
Eosinophils Relative: 13 % — ABNORMAL HIGH (ref 0–5)
HCT: 42.2 % (ref 36.0–46.0)
HEMOGLOBIN: 13.8 g/dL (ref 12.0–15.0)
Lymphocytes Relative: 8 % — ABNORMAL LOW (ref 12–46)
Lymphs Abs: 1.4 10*3/uL (ref 0.7–4.0)
MCH: 32 pg (ref 26.0–34.0)
MCHC: 32.7 g/dL (ref 30.0–36.0)
MCV: 97.9 fL (ref 78.0–100.0)
MONO ABS: 0.8 10*3/uL (ref 0.1–1.0)
MONOS PCT: 5 % (ref 3–12)
Neutro Abs: 13.7 10*3/uL — ABNORMAL HIGH (ref 1.7–7.7)
Neutrophils Relative %: 74 % (ref 43–77)
Platelets: 336 10*3/uL (ref 150–400)
RBC: 4.31 MIL/uL (ref 3.87–5.11)
RDW: 13.2 % (ref 11.5–15.5)
WBC: 18.4 10*3/uL — AB (ref 4.0–10.5)

## 2015-05-02 LAB — BASIC METABOLIC PANEL
Anion gap: 9 (ref 5–15)
BUN: 24 mg/dL — ABNORMAL HIGH (ref 6–20)
CO2: 25 mmol/L (ref 22–32)
Calcium: 9 mg/dL (ref 8.9–10.3)
Chloride: 108 mmol/L (ref 101–111)
Creatinine, Ser: 1.32 mg/dL — ABNORMAL HIGH (ref 0.44–1.00)
GFR calc Af Amer: 47 mL/min — ABNORMAL LOW (ref >60)
GFR, EST NON AFRICAN AMERICAN: 40 mL/min — AB (ref >60)
Glucose, Bld: 98 mg/dL (ref 65–99)
Potassium: 4.8 mmol/L (ref 3.5–5.1)
SODIUM: 142 mmol/L (ref 135–145)

## 2015-05-02 LAB — MAGNESIUM: Magnesium: 1.9 mg/dL (ref 1.7–2.4)

## 2015-05-02 LAB — BRAIN NATRIURETIC PEPTIDE: B NATRIURETIC PEPTIDE 5: 194.3 pg/mL — AB (ref 0.0–100.0)

## 2015-05-02 MED ORDER — ENOXAPARIN SODIUM 40 MG/0.4ML ~~LOC~~ SOLN
40.0000 mg | SUBCUTANEOUS | Status: DC
  Administered 2015-05-02: 40 mg via SUBCUTANEOUS
  Filled 2015-05-02: qty 0.4

## 2015-05-02 MED ORDER — AMOXICILLIN-POT CLAVULANATE 875-125 MG PO TABS
1.0000 | ORAL_TABLET | Freq: Two times a day (BID) | ORAL | Status: DC
  Administered 2015-05-02 – 2015-05-03 (×2): 1 via ORAL
  Filled 2015-05-02 (×2): qty 1

## 2015-05-02 MED ORDER — PRAVASTATIN SODIUM 40 MG PO TABS
40.0000 mg | ORAL_TABLET | Freq: Every day | ORAL | Status: DC
  Administered 2015-05-02: 40 mg via ORAL
  Filled 2015-05-02: qty 1

## 2015-05-02 MED ORDER — IPRATROPIUM BROMIDE 0.02 % IN SOLN: 0.5000 mg | RESPIRATORY_TRACT | Status: DC | PRN

## 2015-05-02 MED ORDER — SENNOSIDES-DOCUSATE SODIUM 8.6-50 MG PO TABS: 1.0000 | ORAL_TABLET | Freq: Every evening | ORAL | Status: DC | PRN

## 2015-05-02 MED ORDER — BENZONATATE 100 MG PO CAPS
200.0000 mg | ORAL_CAPSULE | Freq: Three times a day (TID) | ORAL | Status: DC
  Administered 2015-05-02 – 2015-05-03 (×2): 200 mg via ORAL
  Filled 2015-05-02 (×2): qty 2

## 2015-05-02 MED ORDER — SORBITOL 70 % SOLN
30.0000 mL | Freq: Every day | Status: DC | PRN
  Filled 2015-05-02: qty 30

## 2015-05-02 MED ORDER — ALBUTEROL (5 MG/ML) CONTINUOUS INHALATION SOLN
10.0000 mg/h | INHALATION_SOLUTION | Freq: Once | RESPIRATORY_TRACT | Status: AC
  Administered 2015-05-02: 10 mg/h via RESPIRATORY_TRACT
  Filled 2015-05-02: qty 20

## 2015-05-02 MED ORDER — CETYLPYRIDINIUM CHLORIDE 0.05 % MT LIQD
7.0000 mL | Freq: Two times a day (BID) | OROMUCOSAL | Status: DC
  Administered 2015-05-03: 7 mL via OROMUCOSAL

## 2015-05-02 MED ORDER — METHYLPREDNISOLONE SODIUM SUCC 125 MG IJ SOLR
60.0000 mg | Freq: Three times a day (TID) | INTRAMUSCULAR | Status: DC
  Administered 2015-05-02 – 2015-05-03 (×2): 60 mg via INTRAVENOUS
  Filled 2015-05-02: qty 2

## 2015-05-02 MED ORDER — AMLODIPINE BESYLATE 10 MG PO TABS
10.0000 mg | ORAL_TABLET | Freq: Every day | ORAL | Status: DC
  Administered 2015-05-03: 10 mg via ORAL
  Filled 2015-05-02: qty 1

## 2015-05-02 MED ORDER — SODIUM CHLORIDE 0.9 % IV SOLN
INTRAVENOUS | Status: DC
  Administered 2015-05-02: 19:00:00 via INTRAVENOUS
  Administered 2015-05-03: 1000 mL via INTRAVENOUS

## 2015-05-02 MED ORDER — SODIUM CHLORIDE 0.9 % IV SOLN: INTRAVENOUS | Status: DC

## 2015-05-02 MED ORDER — IPRATROPIUM BROMIDE 0.02 % IN SOLN
0.5000 mg | Freq: Once | RESPIRATORY_TRACT | Status: AC
  Administered 2015-05-02: 0.5 mg via RESPIRATORY_TRACT
  Filled 2015-05-02: qty 2.5

## 2015-05-02 MED ORDER — SODIUM CHLORIDE 0.9 % IJ SOLN: 3.0000 mL | Freq: Two times a day (BID) | INTRAMUSCULAR | Status: DC

## 2015-05-02 MED ORDER — ACETAMINOPHEN 325 MG PO TABS: 650.0000 mg | ORAL_TABLET | Freq: Four times a day (QID) | ORAL | Status: DC | PRN

## 2015-05-02 MED ORDER — LEVALBUTEROL HCL 0.63 MG/3ML IN NEBU
0.6300 mg | INHALATION_SOLUTION | Freq: Four times a day (QID) | RESPIRATORY_TRACT | Status: DC
  Administered 2015-05-02 – 2015-05-03 (×3): 0.63 mg via RESPIRATORY_TRACT
  Filled 2015-05-02 (×3): qty 3

## 2015-05-02 MED ORDER — ACETAMINOPHEN 650 MG RE SUPP: 650.0000 mg | Freq: Four times a day (QID) | RECTAL | Status: DC | PRN

## 2015-05-02 MED ORDER — PREGABALIN 25 MG PO CAPS
50.0000 mg | ORAL_CAPSULE | Freq: Three times a day (TID) | ORAL | Status: DC
  Administered 2015-05-02 – 2015-05-03 (×2): 50 mg via ORAL
  Filled 2015-05-02 (×2): qty 2

## 2015-05-02 MED ORDER — IBUPROFEN 400 MG PO TABS: 400.0000 mg | ORAL_TABLET | Freq: Two times a day (BID) | ORAL | Status: DC | PRN

## 2015-05-02 MED ORDER — ONDANSETRON HCL 4 MG/2ML IJ SOLN: 4.0000 mg | Freq: Four times a day (QID) | INTRAMUSCULAR | Status: DC | PRN

## 2015-05-02 MED ORDER — SODIUM CHLORIDE 0.9 % IV BOLUS (SEPSIS)
1000.0000 mL | Freq: Once | INTRAVENOUS | Status: AC
  Administered 2015-05-02: 1000 mL via INTRAVENOUS

## 2015-05-02 MED ORDER — LEVALBUTEROL HCL 0.63 MG/3ML IN NEBU: 0.6300 mg | INHALATION_SOLUTION | RESPIRATORY_TRACT | Status: DC | PRN

## 2015-05-02 MED ORDER — ONDANSETRON HCL 4 MG PO TABS: 4.0000 mg | ORAL_TABLET | Freq: Four times a day (QID) | ORAL | Status: DC | PRN

## 2015-05-02 MED ORDER — PANTOPRAZOLE SODIUM 40 MG PO TBEC
40.0000 mg | DELAYED_RELEASE_TABLET | Freq: Every day | ORAL | Status: DC
  Administered 2015-05-03: 40 mg via ORAL
  Filled 2015-05-02: qty 1

## 2015-05-02 MED ORDER — SODIUM CHLORIDE 0.9 % IV SOLN
INTRAVENOUS | Status: DC
  Administered 2015-05-02: 09:00:00 via INTRAVENOUS

## 2015-05-02 MED ORDER — IPRATROPIUM BROMIDE 0.02 % IN SOLN
0.5000 mg | Freq: Four times a day (QID) | RESPIRATORY_TRACT | Status: DC
  Administered 2015-05-02 – 2015-05-03 (×3): 0.5 mg via RESPIRATORY_TRACT
  Filled 2015-05-02 (×3): qty 2.5

## 2015-05-02 MED ORDER — METHYLPREDNISOLONE SODIUM SUCC 125 MG IJ SOLR
125.0000 mg | Freq: Once | INTRAMUSCULAR | Status: AC
  Administered 2015-05-02: 125 mg via INTRAVENOUS
  Filled 2015-05-02: qty 2

## 2015-05-02 NOTE — ED Notes (Signed)
Family at bedside. 

## 2015-05-02 NOTE — ED Notes (Signed)
MD at bedside. To re-evaluate pt

## 2015-05-02 NOTE — ED Notes (Signed)
MD at bedside. 

## 2015-05-02 NOTE — ED Provider Notes (Signed)
CSN: [id number]     Arrival date & time 05/02/15  0902 History   First MD Initiated Contact with Patient 05/02/15 [phone]     Chief Complaint  Patient presents with  . Shortness of Breath     (Consider location/radiation/quality/duration/timing/severity/associated sxs/prior Treatment) HPI Comments: Patient here complaining of 2 days of worsening shortness of breath consistent with her COPD exacerbation. Has had nonproductive cough without fever or chills. No anginal type chest pain. No vomiting or diarrhea. Has used her home nebulizers without relief. She is chronically on 2 L of oxygen at home. Is unsure of the trigger of her symptoms. Is not currently taking any cortical steroids.  Patient is a 69 y.o. female presenting with shortness of breath. The history is provided by the patient and the spouse.  Shortness of Breath   Past Medical History  Diagnosis Date  . COPD (chronic obstructive pulmonary disease)   . Hypertension   . Emphysema of lung   . Anxiety 06/24/2012  . Essential hypertension 06/24/2012  . Emphysema 06/24/2012  . History of Left leg claudication 06/24/2012  . Shortness of breath dyspnea    Past Surgical History  Procedure Laterality Date  . Abdominal aortagram  12/09/12  . Abdominal aortagram N/A 12/09/2012    Procedure: ABDOMINAL Maxcine Ham;  Surgeon: Angelia Mould, MD;  Location[facility] CATH LAB;  Service: Cardiovascular;  Laterality: N/A;   Family History  Problem Relation Age of Onset  . Cancer Mother   . Heart attack Father   . Deep vein thrombosis Father   . Hyperlipidemia Father   . Hypertension Father    Social History  Substance Use Topics  . Smoking status: Former Smoker -- 1.50 packs/day for 30 years    Types: Cigarettes    Quit date: 12/30/2009  . Smokeless tobacco: Never Used  . Alcohol Use: No   OB History    No data available     Review of Systems  Respiratory: Positive for shortness of breath.   All other systems reviewed and are  negative.     Allergies  Codeine and Levaquin  Home Medications   Prior to Admission medications   Medication Sig Start Date End Date Taking? Authorizing Provider  albuterol (PROVENTIL) (2.5 MG/3ML) 0.083% nebulizer solution Take 3 mLs (2.5 mg total) by nebulization every 4 (four) hours as needed for wheezing or shortness of breath. 04/21/14   Marcial Pacas, DO  AMBULATORY NON FORMULARY MEDICATION Oxygen concentrator that has humidification capability Dx:J43.4 10/21/14   Marcial Pacas, DO  amLODipine (NORVASC) 10 MG tablet TAKE 1 TABLET EVERY DAY 04/08/15   Marcial Pacas, DO  aspirin EC 325 MG tablet Take 1 tablet (325 mg total) by mouth daily. 11/04/12   Marcial Pacas, DO  calcium carbonate (TUMS) 500 MG chewable tablet Chew 1 tablet by mouth daily as needed for heartburn.    Historical Provider, MD  celecoxib (CELEBREX) 100 MG capsule Take 1 capsule (100 mg total) by mouth 2 (two) times daily as needed (arthritis). 01/18/15   Sean Hommel, DO  lisinopril (PRINIVIL,ZESTRIL) 10 MG tablet Take 1 tablet (10 mg total) by mouth daily. 04/19/15   Sean Hommel, DO  LYRICA 50 MG capsule TAKE ONE CAPSULE BY MOUTH 3 TIMES A DAY 12/28/14   Sean Hommel, DO  pantoprazole (PROTONIX) 40 MG tablet Take 1 tablet (40 mg total) by mouth daily. Only until mid June 03/15/15   Marcial Pacas, DO  Pitavastatin Calcium (LIVALO) 4 MG TABS Take 1 tablet (4 mg  total) by mouth every other day. 04/20/15   Sean Hommel, DO  tiotropium (SPIRIVA) 18 MCG inhalation capsule Place 1 capsule (18 mcg total) into inhaler and inhale daily. 04/21/14   Sean Hommel, DO   BP 145/76 mmHg  Pulse 81  Temp(Src) 98.7 F (37.1 C) (Oral)  Resp 26  SpO2 95% Physical Exam  Constitutional: She is oriented to person, place, and time. She appears well-developed and well-nourished.  Non-toxic appearance. No distress.  HENT:  Head: Normocephalic and atraumatic.  Eyes: Conjunctivae, EOM and lids are normal. Pupils are equal, round, and reactive to light.  Neck:  Normal range of motion. Neck supple. No tracheal deviation present. No thyroid mass present.  Cardiovascular: Normal rate, regular rhythm and normal heart sounds.  Exam reveals no gallop.   No murmur heard. Pulmonary/Chest: Effort normal. No stridor. No respiratory distress. She has decreased breath sounds. She has no wheezes. She has no rhonchi. She has no rales.  Abdominal: Soft. Normal appearance and bowel sounds are normal. She exhibits no distension. There is no tenderness. There is no rebound and no CVA tenderness.  Musculoskeletal: Normal range of motion. She exhibits no edema or tenderness.  Neurological: She is alert and oriented to person, place, and time. She has normal strength. No cranial nerve deficit or sensory deficit. GCS eye subscore is 4. GCS verbal subscore is 5. GCS motor subscore is 6.  Skin: Skin is warm and dry. No abrasion and no rash noted.  Psychiatric: She has a normal mood and affect. Her speech is normal and behavior is normal.  Nursing note and vitals reviewed.   ED Course  Procedures (including critical care time) Labs Review Labs Reviewed - No data to display  Imaging Review No results found. I, ALLEN,ANTHONY T, personally reviewed and evaluated these images and lab results as part of my medical decision-making.   EKG Interpretation   Date/Time:  Sunday May 02 2015 09:12:35 EDT Ventricular Rate:  79 PR Interval:  162 QRS Duration: 74 QT Interval:  354 QTC Calculation: 405 R Axis:   80 Text Interpretation:  Normal sinus rhythm Possible Anterior infarct , age  undetermined Abnormal ECG No significant change since last tracing  Confirmed by ALLEN  MD, ANTHONY ([id number]) on 05/02/2015 9:24:31 AM      MDM   Final diagnoses:  SOB (shortness of breath)    Patient given albuterol continuous treatment along with Solu-Medrol 125 IV push. Patient's breathing has slightly improved. Was noted to have renal insufficiency on her be met given IV fluids. Will  be admitted for treatment of her COPD exacerbation.  Anthony Allen, MD 05/02/15 1059 

## 2015-05-02 NOTE — ED Notes (Signed)
Denies any chest pain at this time.

## 2015-05-02 NOTE — ED Notes (Signed)
Returns from radiology, placed back on cont POX and cardiac monitoring, Neb tx initiated by RT staff member, cont to require oxygen via [place] at 2lpm

## 2015-05-02 NOTE — H&P (Signed)
[facility] History and Physical  Catherine Phillips ZOX:[id number] DOB: 1946-08-04 DOA: 05/02/2015  Referring physician: Dr. Raylene Miyamoto PCP: Marcial Pacas, DO   Chief Complaint: Shortness of breath  HPI: Catherine Phillips is a 69 y.o. female  History of COPD on home O2 2 L as needed, hypertension, anxiety who presents to the ED with a one-day history of worsening shortness of breath. Patient stated that she got short of breath at work while working called her husband who came and got her. Patient stated as shortness of breath worsened and the night prior to admission she uses nebulizers at home with some relief. Patient states she woke up on the morning of admission and felt bad with some pallor and worsening shortness of breath and subsequently presented to the ED. Patient denies any wheezing, no fever, no chills, no nausea, no vomiting, no chest pain, no diarrhea, no constipation, no dysuria, no weakness, no melanotic, no hematemesis, no hematochezia. Patient does endorse a nonproductive cough and shortness of breath. Patient denies any recent long car rides. Patient denies any recent travel. Patient states she walks at work. Patient denies any recent surgeries. Patient denies any orthopnea, no paroxysmal maternal dyspnea, no lower extremity edema. Patient was seen in the emergency room was given IV Solu-Medrol, nebulizer treatments with improvement in his symptoms. [facility] will call to admit the patient for further evaluation and management.   Review of Systems: As per history of present illness otherwise negative. Constitutional:  No weight loss, night sweats, Fevers, chills, fatigue.  HEENT:  No headaches, Difficulty swallowing,Tooth/dental problems,Sore throat,  No sneezing, itching, ear ache, nasal congestion, post nasal drip,  Cardio-vascular:  No chest pain, Orthopnea, PND, swelling in lower extremities, anasarca, dizziness, palpitations  GI:  No heartburn, indigestion,  abdominal pain, nausea, vomiting, diarrhea, change in bowel habits, loss of appetite  Resp:  No shortness of breath with exertion or at rest. No excess mucus, no productive cough, No non-productive cough, No coughing up of blood.No change in color of mucus.No wheezing.No chest wall deformity  Skin:  no rash or lesions.  GU:  no dysuria, change in color of urine, no urgency or frequency. No flank pain.  Musculoskeletal:  No joint pain or swelling. No decreased range of motion. No back pain.  Psych:  No change in mood or affect. No depression or anxiety. No memory loss.   Past Medical History  Diagnosis Date  . COPD (chronic obstructive pulmonary disease)   . Hypertension   . Emphysema of lung   . Anxiety 06/24/2012  . Essential hypertension 06/24/2012  . Emphysema 06/24/2012  . History of Left leg claudication 06/24/2012  . Shortness of breath dyspnea    Past Surgical History  Procedure Laterality Date  . Abdominal aortagram  12/09/12  . Abdominal aortagram N/A 12/09/2012    Procedure: ABDOMINAL Maxcine Ham;  Surgeon: Angelia Mould, MD;  Location: [facility] CATH LAB;  Service: Cardiovascular;  Laterality: N/A;   Social History:  reports that she quit smoking about 5 years ago. Her smoking use included Cigarettes. She has a 45 pack-year smoking history. She has never used smokeless tobacco. She reports that she does not drink alcohol or use illicit drugs.  Allergies  Allergen Reactions  . Codeine Nausea And Vomiting  . Levaquin [Levofloxacin In D5w] Itching and Rash    Family History  Problem Relation Age of Onset  . Cancer Mother   . Heart attack Father   . Deep vein thrombosis Father   .  Hyperlipidemia Father   . Hypertension Father      Prior to Admission medications   Medication Sig Start Date End Date Taking? Authorizing Provider  albuterol (PROVENTIL) (2.5 MG/3ML) 0.083% nebulizer solution Take 3 mLs (2.5 mg total) by nebulization every 4 (four) hours as needed for  wheezing or shortness of breath. Patient taking differently: Take 2.5 mg by nebulization 2 (two) times daily as needed for wheezing or shortness of breath.  04/21/14  Yes Sean Hommel, DO  amLODipine (NORVASC) 10 MG tablet TAKE 1 TABLET EVERY DAY 04/08/15  Yes Sean Hommel, DO  calcium carbonate (TUMS) 500 MG chewable tablet Chew 1 tablet by mouth daily as needed for heartburn.   Yes Historical Provider, MD  celecoxib (CELEBREX) 100 MG capsule Take 1 capsule (100 mg total) by mouth 2 (two) times daily as needed (arthritis). 01/18/15  Yes Sean Hommel, DO  ibuprofen (ADVIL,MOTRIN) 200 MG tablet Take 400 mg by mouth 2 (two) times daily as needed for headache.   Yes Historical Provider, MD  lisinopril (PRINIVIL,ZESTRIL) 10 MG tablet Take 1 tablet (10 mg total) by mouth daily. 04/19/15  Yes Sean Hommel, DO  LYRICA 50 MG capsule TAKE ONE CAPSULE BY MOUTH 3 TIMES A DAY Patient taking differently: TAKE ONE CAPSULE BY MOUTH every evening. 12/28/14  Yes Sean Hommel, DO  Omega-3 Fatty Acids (FISH OIL PO) Take 1 tablet by mouth 2 (two) times daily.   Yes Historical Provider, MD  Pitavastatin Calcium (LIVALO) 4 MG TABS Take 1 tablet (4 mg total) by mouth every other day. 04/20/15  Yes Sean Hommel, DO  tiotropium (SPIRIVA) 18 MCG inhalation capsule Place 1 capsule (18 mcg total) into inhaler and inhale daily. Patient taking differently: Place 18 mcg into inhaler and inhale daily as needed (for wheezing or shortness of breath).  04/21/14  Yes Sean Hommel, DO  AMBULATORY NON FORMULARY MEDICATION Oxygen concentrator that has humidification capability Dx:J43.4 10/21/14   Sean Hommel, DO  pantoprazole (PROTONIX) 40 MG tablet Take 1 tablet (40 mg total) by mouth daily. Only until mid June Patient not taking: Reported on 05/02/2015 03/15/15   Marcial Pacas, DO   Physical Exam: Filed Vitals:   05/02/15 1400 05/02/15 1430 05/02/15 1500 05/02/15 1641  BP: 133/42 138/45 141/49 126/58  Pulse: 97 98 102 99  Temp:    98.2 F (36.8 C)    TempSrc:    Oral  Resp:    20  SpO2: 90% 93% 92% 94%    Wt Readings from Last 3 Encounters:  04/19/15 49.896 kg (110 lb)  01/18/15 49.896 kg (110 lb)  12/03/14 48.988 kg (108 lb)    General:  Well-developed well-nourished in no acute cardiopulmonary distress. Speaking in full sentences.  Eyes: PERRLA, EOMI, normal lids, irises & conjunctiva ENT: grossly normal hearing, lips & tongue, clear no lesions no exudates. Neck: no LAD, masses or thyromegaly Cardiovascular: RRR, no m/r/g. No LE edema.  Respiratory: CTA bilaterally, no w/r/r. Fair air movement.  Abdomen: soft, ntnd, positive bowel sounds, no rebound, no guarding Skin: no rash or induration seen on limited exam Musculoskeletal: grossly normal tone BUE/BLE Psychiatric: grossly normal mood and affect, speech fluent and appropriate Neurologic: Alert and oriented 3. Cranial nerves II through XII are grossly intact. Visual fields are intact. Sensation is intact. Gait not tested secondary to safety. No focal deficits.           Labs on Admission:  Basic Metabolic Panel:  Recent Labs Lab 05/02/15 0920  NA 142  K 4.8  CL 108  CO2 25  GLUCOSE 98  BUN 24*  CREATININE 1.32*  CALCIUM 9.0   Liver Function Tests: No results for input(s): AST, ALT, ALKPHOS, BILITOT, PROT, ALBUMIN in the last 168 hours. No results for input(s): LIPASE, AMYLASE in the last 168 hours. No results for input(s): AMMONIA in the last 168 hours. CBC:  Recent Labs Lab 05/02/15 0920  WBC 18.4*  NEUTROABS 13.7*  HGB 13.8  HCT 42.2  MCV 97.9  PLT 336   Cardiac Enzymes:  Recent Labs Lab 05/02/15 0920  TROPONINI <0.03    BNP (last 3 results)  Recent Labs  05/02/15 0920  BNP 194.3*    ProBNP (last 3 results)  Recent Labs  08/09/14 0925  PROBNP 431.0*    CBG: No results for input(s): GLUCAP in the last 168 hours.  Radiological Exams on Admission: Dg Chest 2 View  05/02/2015   CLINICAL DATA:  Shortness of breath.  EXAM:  CHEST  2 VIEW  COMPARISON:  August 09, 2014.  FINDINGS: The heart size and mediastinal contours are within normal limits. Stable mild biapical thickening is noted. No pneumothorax or pleural effusion is noted. Interstitial densities are noted throughout both lungs concerning for scarring. Superimposed edema or inflammation cannot be excluded. Multilevel degenerative disc disease is noted in the upper thoracic spine.  IMPRESSION: Diffuse interstitial densities throughout both lungs most consistent with scarring, although superimposed edema or inflammation cannot be excluded.   Electronically Signed   By: Marijo Conception, M.D.   On: 05/02/2015 10:10    EKG: Independently reviewed. Normal sinus rhythm  Assessment/Plan Principal Problem:   COPD exacerbation Active Problems:   Essential hypertension   Anxiety   Hyperlipidemia LDL goal <70   GERD (gastroesophageal reflux disease)   COPD (chronic obstructive pulmonary disease)   Leukocytosis   SOB (shortness of breath)   Dehydration   AKI (acute kidney injury)  #1 probable mild COPD exacerbation Patient presented with worsening shortness of breath 1 day with some improvement on nebulizer treatments. Patient with a low pretest probability for PE. Patient denies any chest pain. Will admit patient to telemetry. Chest x-ray which was done was negative for any acute infiltrates. Place patient on oxygen, Solu-Medrol 60 mg IV every 8 hours 3 doses and then transitioned to oral prednisone. Place on Augmentin, nebulizer treatments, Tessalon Perles. Supportive care. Follow.  #2 shortness of breath Likely secondary to problem #1. Will cycle cardiac enzymes every 6 hours 3. See problem #1.  #3 leukocytosis Likely reactive leukocytosis. Patient denies any dysuria. Chest x-ray was negative for any acute infiltrate. Patient is afebrile. Will check a UA with cultures and sensitivities. Follow.  #4 dehydration IVF  #5 acute kidney injury Likely  secondary to a prerenal azotemia. Hold ACE inhibitor. Will place on IV fluids. And monitor. If no improvement in renal function will need to get a renal ultrasound and urine studies.  #6 hyperlipidemia Continue statin.   #7 gastroesophageal reflux disease PPI.  #8 hypertension Continue home regimen of Norvasc. Will hold ACE inhibitor secondary to acute kidney injury.  #9 prophylaxis PPI for GI prophylaxis. Lovenox for DVT prophylaxis.   Code Status: Full DVT Prophylaxis:Lovenox Family Communication: updated patient and husband at bedside Disposition Plan: Admit to telemetry  Time spent: [place] MD [facility] Pager [phone]

## 2015-05-02 NOTE — ED Notes (Addendum)
Presents with SOB, onset last evening and progressively became worse

## 2015-05-02 NOTE — ED Notes (Signed)
12 lead EKG done, to EDP for evaluation

## 2015-05-02 NOTE — ED Notes (Signed)
Phone Hand Off Report to [employer] (Paul-RN)

## 2015-05-02 NOTE — ED Notes (Signed)
Pt placed on cont cardiac monitor with cont POX

## 2015-05-02 NOTE — Progress Notes (Signed)
Called by Dr.Allen at [facility] , PCP Dr.Hommel 68/F, COPD exacerbation on Home O2, dyspnea, wheezing since yesterday, using nebs since yesterday CXR unchanged, ? Scarring EKG non acute, vital stable, Troponin negative Accepted to [place], [place], inpatient  Catherine Phillips, [name]

## 2015-05-02 NOTE — ED Notes (Signed)
Patient transported to X-ray via stretcher, sr x 2 , with port oxygen at 2lpm via 

## 2015-05-03 DIAGNOSIS — J441 Chronic obstructive pulmonary disease with (acute) exacerbation: Secondary | ICD-10-CM | POA: Diagnosis not present

## 2015-05-03 LAB — TROPONIN I

## 2015-05-03 LAB — URINALYSIS, ROUTINE W REFLEX MICROSCOPIC
Bilirubin Urine: NEGATIVE
Glucose, UA: 250 mg/dL — AB
HGB URINE DIPSTICK: NEGATIVE
Ketones, ur: NEGATIVE mg/dL
LEUKOCYTES UA: NEGATIVE
Nitrite: POSITIVE — AB
Protein, ur: NEGATIVE mg/dL
SPECIFIC GRAVITY, URINE: 1.011 (ref 1.005–1.030)
UROBILINOGEN UA: 0.2 mg/dL (ref 0.0–1.0)
pH: 5.5 (ref 5.0–8.0)

## 2015-05-03 LAB — BASIC METABOLIC PANEL
ANION GAP: 9 (ref 5–15)
BUN: 16 mg/dL (ref 6–20)
CALCIUM: 8.6 mg/dL — AB (ref 8.9–10.3)
CO2: 23 mmol/L (ref 22–32)
CREATININE: 0.95 mg/dL (ref 0.44–1.00)
Chloride: 108 mmol/L (ref 101–111)
Glucose, Bld: 185 mg/dL — ABNORMAL HIGH (ref 65–99)
Potassium: 4.3 mmol/L (ref 3.5–5.1)
SODIUM: 140 mmol/L (ref 135–145)

## 2015-05-03 LAB — URINE MICROSCOPIC-ADD ON

## 2015-05-03 LAB — CBC
HCT: 38.5 % (ref 36.0–46.0)
HEMOGLOBIN: 12.6 g/dL (ref 12.0–15.0)
MCH: 31.7 pg (ref 26.0–34.0)
MCHC: 32.7 g/dL (ref 30.0–36.0)
MCV: 97 fL (ref 78.0–100.0)
PLATELETS: 363 10*3/uL (ref 150–400)
RBC: 3.97 MIL/uL (ref 3.87–5.11)
RDW: 13.3 % (ref 11.5–15.5)
WBC: 24.5 10*3/uL — AB (ref 4.0–10.5)

## 2015-05-03 MED ORDER — PREDNISONE 10 MG PO TABS: ORAL_TABLET | ORAL | Status: DC

## 2015-05-03 MED ORDER — AMOXICILLIN-POT CLAVULANATE 875-125 MG PO TABS: 1.0000 | ORAL_TABLET | Freq: Two times a day (BID) | ORAL | Status: DC

## 2015-05-03 MED ORDER — LEVALBUTEROL HCL 0.63 MG/3ML IN NEBU: 0.6300 mg | INHALATION_SOLUTION | Freq: Two times a day (BID) | RESPIRATORY_TRACT | Status: DC

## 2015-05-03 MED ORDER — IPRATROPIUM BROMIDE 0.02 % IN SOLN: 0.5000 mg | Freq: Two times a day (BID) | RESPIRATORY_TRACT | Status: DC

## 2015-05-03 NOTE — Progress Notes (Signed)
PT Cancellation Note  Patient Details Name: Catherine Phillips MRN: [id number] DOB: [birth year]   Cancelled Treatment:    Reason Eval/Treat Not Completed: PT screened, no needs identified, will sign off. Pt independent with mobility without difficulty.   [place] 05/03/2015, 9:22 AM  [facility] PT [phone]

## 2015-05-03 NOTE — Progress Notes (Signed)
OT Cancellation Note and Discharge  Patient Details Name: Catherine Phillips MRN: [id number] DOB: [birth year]   Cancelled Treatment:    Reason Eval/Treat Not Completed: OT screened, no needs identified, will sign off. Received a text page from PT that pt is independent.  Almon Register [phone] 05/03/2015, 9:39 AM

## 2015-05-03 NOTE — Progress Notes (Signed)
Husband at bedside will bring O2 prior to discharge. Will reviewed d/c summary once family return.  Ave Filter, RN

## 2015-05-03 NOTE — Discharge Summary (Signed)
Physician Discharge Summary  Catherine Phillips XBW:[id number] DOB: 1946/07/04 DOA: 05/02/2015  PCP: Marcial Pacas, DO  Admit date: 05/02/2015 Discharge date: 05/03/2015  Time spent: greater than 30 minutes  Discharge Diagnoses:  Principal Problem:   COPD exacerbation Active Problems:   Essential hypertension   Anxiety   Hyperlipidemia LDL goal <70   GERD (gastroesophageal reflux disease) Chronic respiratory failure   Dehydration   AKI (acute kidney injury)   Discharge Condition: stable  Diet recommendation: heart healthy  There were no vitals filed for this visit.  History of present illness:   69 y.o. female  History of COPD on home O2 2 L as needed, hypertension, anxiety who presents to the ED with a one-day history of worsening shortness of breath. Patient stated that she got short of breath at work while working called her husband who came and got her. Patient stated as shortness of breath worsened and the night prior to admission she uses nebulizers at home with some relief. Patient states she woke up on the morning of admission and felt bad with some pallor and worsening shortness of breath and subsequently presented to the ED. Patient denies any wheezing, no fever, no chills, no nausea, no vomiting, no chest pain, no diarrhea, no constipation, no dysuria, no weakness, no melanotic, no hematemesis, no hematochezia. Patient does endorse a nonproductive cough and shortness of breath. Patient denies any recent long car rides. Patient denies any recent travel. Patient states she walks at work. Patient denies any recent surgeries. Patient denies any orthopnea, no paroxysmal maternal dyspnea, no lower extremity edema. Patient was seen in the emergency room was given IV Solu-Medrol, nebulizer treatments with improvement in his symptoms. [facility] will call to admit the patient for further evaluation and management. Hospital Course:  Started on steroids, antibiotics, bronchodilators.  Improved quickly and by the following day feeling much better and requesting discharge  Procedures:  none  Consultations:  none  Discharge Exam: Filed Vitals:   05/03/15 0640  BP: 130/52  Pulse: 93  Temp: 98.4 F (36.9 C)  Resp:     General: a and o Cardiovascular: RRR Respiratory: CTA with good air movement  Discharge Instructions   Discharge Instructions    Diet - low sodium heart healthy    Complete by:  As directed      Increase activity slowly    Complete by:  As directed           Current Discharge Medication List    START taking these medications   Details  amoxicillin-clavulanate (AUGMENTIN) 875-125 MG per tablet Take 1 tablet by mouth every 12 (twelve) hours. Qty: 8 tablet, Refills: 0    predniSONE (DELTASONE) 10 MG tablet Take as directed Qty: 10 tablet, Refills: 0      CONTINUE these medications which have NOT CHANGED   Details  albuterol (PROVENTIL) (2.5 MG/3ML) 0.083% nebulizer solution Take 3 mLs (2.5 mg total) by nebulization every 4 (four) hours as needed for wheezing or shortness of breath. Qty: 225 mL, Refills: 12   Associated Diagnoses: Pulmonary emphysema, unspecified emphysema type    amLODipine (NORVASC) 10 MG tablet TAKE 1 TABLET EVERY DAY Qty: 90 tablet, Refills: 0    calcium carbonate (TUMS) 500 MG chewable tablet Chew 1 tablet by mouth daily as needed for heartburn.    celecoxib (CELEBREX) 100 MG capsule Take 1 capsule (100 mg total) by mouth 2 (two) times daily as needed (arthritis). Qty: 45 capsule, Refills: 4   Associated Diagnoses: Primary  osteoarthritis of both hands    ibuprofen (ADVIL,MOTRIN) 200 MG tablet Take 400 mg by mouth 2 (two) times daily as needed for headache.    lisinopril (PRINIVIL,ZESTRIL) 10 MG tablet Take 1 tablet (10 mg total) by mouth daily. Qty: 90 tablet, Refills: 3   Associated Diagnoses: Essential hypertension    LYRICA 50 MG capsule TAKE ONE CAPSULE BY MOUTH 3 TIMES A DAY Qty: 270 capsule,  Refills: 0    Omega-3 Fatty Acids (FISH OIL PO) Take 1 tablet by mouth 2 (two) times daily.    Pitavastatin Calcium (LIVALO) 4 MG TABS Take 1 tablet (4 mg total) by mouth every other day. Qty: 90 tablet, Refills: 1    tiotropium (SPIRIVA) 18 MCG inhalation capsule Place 1 capsule (18 mcg total) into inhaler and inhale daily. Qty: 30 capsule, Refills: 11   Associated Diagnoses: Pulmonary emphysema, unspecified emphysema type    AMBULATORY NON FORMULARY MEDICATION Oxygen concentrator that has humidification capability Dx:J43.4 Qty: 1 each, Refills: 0   Associated Diagnoses: Pulmonary emphysema, unspecified emphysema type    pantoprazole (PROTONIX) 40 MG tablet Take 1 tablet (40 mg total) by mouth daily. Only until mid June Qty: 90 tablet, Refills: 0       Allergies  Allergen Reactions  . Codeine Nausea And Vomiting  . Levaquin [Levofloxacin In D5w] Itching and Rash   Follow-up Information    Follow up with Marcial Pacas, DO.   Specialty:  Family Medicine   Why:  As needed   Contact information:   [place] [address] [phone]        The results of significant diagnostics from this hospitalization (including imaging, microbiology, ancillary and laboratory) are listed below for reference.    Significant Diagnostic Studies: Dg Chest 2 View  05/02/2015   CLINICAL DATA:  Shortness of breath.  EXAM: CHEST  2 VIEW  COMPARISON:  August 09, 2014.  FINDINGS: The heart size and mediastinal contours are within normal limits. Stable mild biapical thickening is noted. No pneumothorax or pleural effusion is noted. Interstitial densities are noted throughout both lungs concerning for scarring. Superimposed edema or inflammation cannot be excluded. Multilevel degenerative disc disease is noted in the upper thoracic spine.  IMPRESSION: Diffuse interstitial densities throughout both lungs most consistent with scarring, although superimposed edema or inflammation cannot be  excluded.   Electronically Signed   By: Marijo Conception, M.D.   On: 05/02/2015 10:10    Microbiology: No results found for this or any previous visit (from the past 240 hour(s)).   Labs: Basic Metabolic Panel:  Recent Labs Lab 05/02/15 0920 05/02/15 1903 05/03/15 0628  NA 142  --  140  K 4.8  --  4.3  CL 108  --  108  CO2 25  --  23  GLUCOSE 98  --  185*  BUN 24*  --  16  CREATININE 1.32*  --  0.95  CALCIUM 9.0  --  8.6*  MG  --  1.9  --    Liver Function Tests: No results for input(s): AST, ALT, ALKPHOS, BILITOT, PROT, ALBUMIN in the last 168 hours. No results for input(s): LIPASE, AMYLASE in the last 168 hours. No results for input(s): AMMONIA in the last 168 hours. CBC:  Recent Labs Lab 05/02/15 0920 05/03/15 0628  WBC 18.4* 24.5*  NEUTROABS 13.7*  --   HGB 13.8 12.6  HCT 42.2 38.5  MCV 97.9 97.0  PLT 336 363   Cardiac Enzymes:  Recent  Labs Lab 05/02/15 0920 05/02/15 1903 05/03/15 0053 05/03/15 0628  TROPONINI <0.03 <0.03 <0.03 <0.03   BNP: BNP (last 3 results)  Recent Labs  05/02/15 0920  BNP 194.3*    ProBNP (last 3 results)  Recent Labs  08/09/14 0925  PROBNP 431.0*    CBG: No results for input(s): GLUCAP in the last 168 hours.     SignedDelfina Redwood  [facility] 05/03/2015, 10:16 AM

## 2015-05-03 NOTE — Progress Notes (Signed)
Discharge instructions reviewed with patient/family. All questions answered at this time. RX given. Awaiting for transport from family.  Ave Filter, RN

## 2015-05-03 NOTE — Care Management Note (Signed)
Case Management Note  Patient Details  Name: Geana Walts MRN: [id number] Date of Birth: 1946/03/21  Subjective/Objective:                 Patient from home, lives with spouse. Has home oxygen through [facility], denies any need for [facility] PT, stating that she still works. She works at [employer] part time and enjoys it so muxh that she is considering full time. Patient states that she has a nebulizer, and a CPAP machine at home as well.    Action/Plan:  Discharge to home today, no CM needs identified.  Expected Discharge Date:                  Expected Discharge Plan:  Home/Self Care  In-House Referral:     Discharge planning Services  CM Consult  Post Acute Care Choice:    Choice offered to:     DME Arranged:    DME Agency:     HH Arranged:    [place] Agency:     Status of Service:  Completed, signed off  Medicare Important Message Given:    Date Medicare IM Given:    Medicare IM give by:    Date Additional Medicare IM Given:    Additional Medicare Important Message give by:     If discussed at [place] of Stay Meetings, dates discussed:    Additional Comments:  Carles Collet, RN 05/03/2015, 11:32 AM

## 2015-05-04 LAB — URINE CULTURE

## 2015-05-10 ENCOUNTER — Ambulatory Visit (INDEPENDENT_AMBULATORY_CARE_PROVIDER_SITE_OTHER): Payer: 59 | Admitting: Family Medicine

## 2015-05-10 ENCOUNTER — Encounter: Payer: Self-pay | Admitting: Family Medicine

## 2015-05-10 VITALS — BP 140/68 | HR 87 | Temp 98.3°F | Wt 110.0 lb

## 2015-05-10 DIAGNOSIS — D72829 Elevated white blood cell count, unspecified: Secondary | ICD-10-CM | POA: Diagnosis not present

## 2015-05-10 DIAGNOSIS — J439 Emphysema, unspecified: Secondary | ICD-10-CM | POA: Diagnosis not present

## 2015-05-10 LAB — CBC
HCT: 41.2 % (ref 36.0–46.0)
Hemoglobin: 13.6 g/dL (ref 12.0–15.0)
MCH: 31.3 pg (ref 26.0–34.0)
MCHC: 33 g/dL (ref 30.0–36.0)
MCV: 94.9 fL (ref 78.0–100.0)
MPV: 11 fL (ref 8.6–12.4)
Platelets: 386 10*3/uL (ref 150–400)
RBC: 4.34 MIL/uL (ref 3.87–5.11)
RDW: 13.7 % (ref 11.5–15.5)
WBC: 18.7 10*3/uL — AB (ref 4.0–10.5)

## 2015-05-10 MED ORDER — ALBUTEROL SULFATE HFA 108 (90 BASE) MCG/ACT IN AERS: INHALATION_SPRAY | RESPIRATORY_TRACT | Status: DC

## 2015-05-10 MED ORDER — BUDESONIDE-FORMOTEROL FUMARATE 160-4.5 MCG/ACT IN AERO: 2.0000 | INHALATION_SPRAY | Freq: Two times a day (BID) | RESPIRATORY_TRACT | Status: DC

## 2015-05-10 NOTE — Progress Notes (Signed)
CC: Catherine Phillips is a 69 y.o. female is here for hospital f/u   Subjective: HPI:  Hospital follow-up for COPD exacerbation that occurred last week. She reports feeling shortness of breath Saturday morning, she was given some benefit from albuterol so she decided to go to work and had a strenuous day at work. Her husband encouraged her to go the emergency room that night however she declined. Sunday morning she woke up with shortness of breath was bad enough to where she went to a local emergency room. She received Solu-Medrol, fluids, had a x-ray which was unremarkable and was sent home the next day per her request. She tells me she is back to her regular state of health other than shortness of breath with exertion. She is no longer taking prednisone but continues to finish her course of Augmentin. She denies wheezing, cough, chest discomfort or chest tightness. She denies fevers, chills nor sore throat. She also had a moderate leukocytosis during her admission before and after receiving IV Solu-Medrol   Review Of Systems Outlined In HPI  Past Medical History  Diagnosis Date  . COPD (chronic obstructive pulmonary disease)   . Hypertension   . Emphysema of lung   . Anxiety 06/24/2012  . Essential hypertension 06/24/2012  . Emphysema 06/24/2012  . History of Left leg claudication 06/24/2012  . Shortness of breath dyspnea     Past Surgical History  Procedure Laterality Date  . Abdominal aortagram  12/09/12  . Abdominal aortagram N/A 12/09/2012    Procedure: ABDOMINAL Maxcine Ham;  Surgeon: Angelia Mould, MD;  Location: [facility] CATH LAB;  Service: Cardiovascular;  Laterality: N/A;   Family History  Problem Relation Age of Onset  . Cancer Mother   . Heart attack Father   . Deep vein thrombosis Father   . Hyperlipidemia Father   . Hypertension Father     Social History   Social History  . Marital Status: Married    Spouse Name: N/A  . Number of Children: N/A  . Years of Education: N/A    Occupational History  . Not on file.   Social History Main Topics  . Smoking status: Former Smoker -- 1.50 packs/day for 30 years    Types: Cigarettes    Quit date: 12/30/2009  . Smokeless tobacco: Never Used  . Alcohol Use: No  . Drug Use: No  . Sexual Activity: Not on file   Other Topics Concern  . Not on file   Social History Narrative     Objective: BP 140/68 mmHg  Pulse 87  Temp(Src) 98.3 F (36.8 C) (Oral)  Wt 110 lb (49.896 kg)  SpO2 93%  General: Alert and Oriented, No Acute Distress HEENT: Pupils equal, round, reactive to light. Conjunctivae clear.  Moist mucous membranes Lungs: Clear to auscultation bilaterally, no wheezing/ronchi/rales.  Comfortable work of breathing. Good air movement. Cardiac: Regular rate and rhythm. Normal S1/S2.  No murmurs, rubs, nor gallops.   Extremities: No peripheral edema.  Strong peripheral pulses.  Mental Status: No depression, anxiety, nor agitation. Skin: Warm and dry.  Assessment & Plan: Catherine Phillips was seen today for hospital f/u.  Diagnoses and all orders for this visit:  Pulmonary emphysema, unspecified emphysema type -     CBC  Leukocytosis -     CBC  Other orders -     budesonide-formoterol (SYMBICORT) 160-4.5 MCG/ACT inhaler; Inhale 2 puffs into the lungs 2 (two) times daily. -     albuterol (PROVENTIL HFA;VENTOLIN HFA) 108 (90 BASE)  MCG/ACT inhaler; Inhale two puffs every 4-6 hours only as needed for shortness of breath or wheezing.   Emphysema: Chronic uncontrolled condition having Symbicort, call if samples are beneficial and I will send a formal prescription Leukocytosis: Rechecking as I anticipate this should be improving after antibiotic initiated  25 minutes spent face-to-face during visit today of which at least 50% was counseling or coordinating care regarding: 1. Pulmonary emphysema, unspecified emphysema type   2. Leukocytosis      Return in about 3 months (around 08/10/2015).

## 2015-05-17 ENCOUNTER — Other Ambulatory Visit: Payer: Self-pay | Admitting: Family Medicine

## 2015-05-17 DIAGNOSIS — D72829 Elevated white blood cell count, unspecified: Secondary | ICD-10-CM

## 2015-05-18 LAB — CBC WITH DIFFERENTIAL/PLATELET
BASOS PCT: 1 % (ref 0–1)
Basophils Absolute: 0.2 10*3/uL — ABNORMAL HIGH (ref 0.0–0.1)
EOS ABS: 2.7 10*3/uL — AB (ref 0.0–0.7)
Eosinophils Relative: 16 % — ABNORMAL HIGH (ref 0–5)
HCT: 40.4 % (ref 36.0–46.0)
HEMOGLOBIN: 13.8 g/dL (ref 12.0–15.0)
Lymphocytes Relative: 18 % (ref 12–46)
Lymphs Abs: 3.1 10*3/uL (ref 0.7–4.0)
MCH: 32.2 pg (ref 26.0–34.0)
MCHC: 34.2 g/dL (ref 30.0–36.0)
MCV: 94.4 fL (ref 78.0–100.0)
MONO ABS: 1.2 10*3/uL — AB (ref 0.1–1.0)
MONOS PCT: 7 % (ref 3–12)
MPV: 10.6 fL (ref 8.6–12.4)
NEUTROS ABS: 9.9 10*3/uL — AB (ref 1.7–7.7)
Neutrophils Relative %: 58 % (ref 43–77)
Platelets: 412 10*3/uL — ABNORMAL HIGH (ref 150–400)
RBC: 4.28 MIL/uL (ref 3.87–5.11)
RDW: 13.8 % (ref 11.5–15.5)
WBC: 17 10*3/uL — ABNORMAL HIGH (ref 4.0–10.5)

## 2015-05-31 ENCOUNTER — Ambulatory Visit (INDEPENDENT_AMBULATORY_CARE_PROVIDER_SITE_OTHER): Payer: 59 | Admitting: Critical Care Medicine

## 2015-05-31 ENCOUNTER — Encounter: Payer: Self-pay | Admitting: Critical Care Medicine

## 2015-05-31 VITALS — BP 128/64 | HR 74 | Temp 97.8°F | Ht 62.0 in | Wt 111.2 lb

## 2015-05-31 DIAGNOSIS — J449 Chronic obstructive pulmonary disease, unspecified: Secondary | ICD-10-CM | POA: Diagnosis not present

## 2015-05-31 DIAGNOSIS — Z23 Encounter for immunization: Secondary | ICD-10-CM

## 2015-05-31 DIAGNOSIS — J432 Centrilobular emphysema: Secondary | ICD-10-CM

## 2015-05-31 MED ORDER — BUDESONIDE-FORMOTEROL FUMARATE 160-4.5 MCG/ACT IN AERO: 2.0000 | INHALATION_SPRAY | Freq: Two times a day (BID) | RESPIRATORY_TRACT | Status: DC

## 2015-05-31 NOTE — Progress Notes (Signed)
Subjective:    Patient ID: Catherine Phillips, female    DOB: Jan 15, 1946, 69 y.o.   MRN: [id number]  HPI 05/31/2015 Chief Complaint  Patient presents with  . Follow-up    6 month follow up: pt states sehe is doing great. no complaints at this time.    Pt was adm one night a few months ago> pt got overheated and rx prednisone.  Pt denies any significant sore throat, nasal congestion or excess secretions, fever, chills, sweats, unintended weight loss, pleurtic or exertional chest pain, orthopnea PND, or leg swelling Pt denies any increase in rescue therapy over baseline, denies waking up needing it or having any early am or nocturnal exacerbations of coughing/wheezing/or dyspnea. Pt also denies any obvious fluctuation in symptoms with  weather or environmental change or other alleviating or aggravating factors   Current Medications, Allergies, Complete Past Medical History, Past Surgical History, Family History, and Social History were reviewed in [facility] record per todays encounter:  05/31/2015  Review of Systems  Constitutional: Negative.   HENT: Negative.  Negative for ear pain, postnasal drip, rhinorrhea, sinus pressure, sore throat, trouble swallowing and voice change.   Eyes: Negative.   Respiratory: Positive for shortness of breath. Negative for apnea, cough, choking, chest tightness, wheezing and stridor.   Cardiovascular: Negative.  Negative for chest pain, palpitations and leg swelling.  Gastrointestinal: Negative.  Negative for nausea, vomiting, abdominal pain and abdominal distention.  Genitourinary: Negative.   Musculoskeletal: Negative.  Negative for myalgias and arthralgias.  Skin: Negative.  Negative for rash.  Allergic/Immunologic: Negative.  Negative for environmental allergies and food allergies.  Neurological: Negative.  Negative for dizziness, syncope, weakness and headaches.  Hematological: Negative.  Negative for adenopathy. Does not  bruise/bleed easily.  Psychiatric/Behavioral: Negative.  Negative for sleep disturbance and agitation. The patient is not nervous/anxious.        Objective:   Physical Exam Filed Vitals:   05/31/15 1039  BP: 128/64  Pulse: 74  Temp: 97.8 F (36.6 C)  Height: 5\' 2"  (1.575 m)  Weight: 111 lb 3.2 oz (50.44 kg)  SpO2: 94%    Gen: Pleasant, well-nourished, in no distress,  normal affect  ENT: No lesions,  mouth clear,  oropharynx clear, no postnasal drip  Neck: No JVD, no TMG, no carotid bruits  Lungs: No use of accessory muscles, no dullness to percussion, distant bs   Cardiovascular: RRR, heart sounds normal, no murmur or gallops, no peripheral edema  Abdomen: soft and NT, no HSM,  BS normal  Musculoskeletal: No deformities, no cyanosis or clubbing  Neuro: alert, non focal  Skin: Warm, no lesions or rashes  No results found.        Assessment & Plan:  I personally reviewed all images and lab data in the [facility] system as well as any outside material available during this office visit and agree with the  radiology impressions.   COPD (chronic obstructive pulmonary disease) with emphysema Gold B Gold b copd stable  Need to simplify inhaler program  Plan Flu vaccine today Stop Spiriva Stay on symbicort  Return 6 months with Dr Elsworth Soho at [facility] was seen today for follow-up.  Diagnoses and all orders for this visit:  COPD with chronic bronchitis  Encounter for immunization  Centrilobular emphysema  Other orders -     budesonide-formoterol (SYMBICORT) 160-4.5 MCG/ACT inhaler; Inhale 2 puffs into the lungs 2 (two) times daily. -     Flu  Vaccine QUAD 36+ mos IM

## 2015-05-31 NOTE — Patient Instructions (Signed)
Flu vaccine today Stop Spiriva Stay on symbicort  Return 6 months with Dr Elsworth Soho at [facility]

## 2015-06-01 NOTE — Assessment & Plan Note (Signed)
Gold b copd stable  Need to simplify inhaler program  Plan Flu vaccine today Stop Spiriva Stay on symbicort  Return 6 months with Dr Elsworth Soho at [facility]

## 2015-06-22 ENCOUNTER — Ambulatory Visit (INDEPENDENT_AMBULATORY_CARE_PROVIDER_SITE_OTHER): Payer: 59 | Admitting: Family Medicine

## 2015-06-22 ENCOUNTER — Encounter: Payer: Self-pay | Admitting: Family Medicine

## 2015-06-22 VITALS — BP 124/51 | HR 76 | Wt 110.0 lb

## 2015-06-22 DIAGNOSIS — K219 Gastro-esophageal reflux disease without esophagitis: Secondary | ICD-10-CM | POA: Diagnosis not present

## 2015-06-22 LAB — LIPASE: LIPASE: 38 U/L (ref 7–60)

## 2015-06-22 MED ORDER — PANTOPRAZOLE SODIUM 40 MG PO TBEC: 40.0000 mg | DELAYED_RELEASE_TABLET | Freq: Every day | ORAL | Status: DC

## 2015-06-22 NOTE — Progress Notes (Signed)
CC: Catherine Phillips is a 69 y.o. female is here for Abdominal Pain   Subjective: HPI:  Epigastric pain that has radiated from mild to moderate in severity for the past 2 weeks. It seems to get better with eating worse when fasting. It's also worse with eating spaghetti sauce. It has improved after taking Nexium for 2 days. Symptoms are similar to that which she experienced earlier in the year which were resolved temporarily with Protonix. The pain occasionally radiates into the back. It's described as stabbing in character. It can occur anytime of the day but does not awake her from sleep. She denies fevers, chills, nausea, decreased appetite, constipation, diarrhea or dysphagia.   Review Of Systems Outlined In HPI  Past Medical History  Diagnosis Date  . COPD (chronic obstructive pulmonary disease) ([place])   . Hypertension   . Emphysema of lung ([place])   . Anxiety 06/24/2012  . Essential hypertension 06/24/2012  . Emphysema 06/24/2012  . History of Left leg claudication 06/24/2012  . Shortness of breath dyspnea     Past Surgical History  Procedure Laterality Date  . Abdominal aortagram  12/09/12  . Abdominal aortagram N/A 12/09/2012    Procedure: ABDOMINAL Maxcine Ham;  Surgeon: Angelia Mould, MD;  Location: [facility] CATH LAB;  Service: Cardiovascular;  Laterality: N/A;   Family History  Problem Relation Age of Onset  . Cancer Mother   . Heart attack Father   . Deep vein thrombosis Father   . Hyperlipidemia Father   . Hypertension Father     Social History   Social History  . Marital Status: Married    Spouse Name: N/A  . Number of Children: N/A  . Years of Education: N/A   Occupational History  . Not on file.   Social History Main Topics  . Smoking status: Former Smoker -- 1.50 packs/day for 30 years    Types: Cigarettes    Quit date: 12/30/2009  . Smokeless tobacco: Never Used  . Alcohol Use: No  . Drug Use: No  . Sexual Activity: Not on file   Other Topics Concern  .  Not on file   Social History Narrative     Objective: BP 124/51 mmHg  Pulse 76  Wt 110 lb (49.896 kg)  Vital signs reviewed. General: Alert and Oriented, No Acute Distress HEENT: Pupils equal, round, reactive to light. Conjunctivae clear.  External ears unremarkable.  Moist mucous membranes. Lungs: Clear and comfortable work of breathing, speaking in full sentences without accessory muscle use. Cardiac: Regular rate and rhythm.  Abdomen: Flat and soft, no rebound tenderness, pain is reproduced with palpation of the epigastric region Neuro: CN II-XII grossly intact, gait normal. Extremities: No peripheral edema.  Strong peripheral pulses.  Mental Status: No depression, anxiety, nor agitation. Logical though process. Skin: Warm and dry.  Assessment & Plan: Catherine Phillips was seen today for abdominal pain.  Diagnoses and all orders for this visit:  Gastroesophageal reflux disease, esophagitis presence not specified -     pantoprazole (PROTONIX) 40 MG tablet; Take 1 tablet (40 mg total) by mouth daily. -     Lipase   GERD with gastritis: Restart Protonix, stop Nexium. Instead of using this for only 3 months I have prescribed her 1 year's worth of refills provided starting to help her out in a few days. Asked her to call me if no benefit after 3 days the next several be to add Carafate. Rule out pancreatitis with lipase today  Return if symptoms worsen  or fail to improve.

## 2015-06-26 ENCOUNTER — Emergency Department (HOSPITAL_BASED_OUTPATIENT_CLINIC_OR_DEPARTMENT_OTHER): Payer: 59

## 2015-06-26 ENCOUNTER — Emergency Department (HOSPITAL_BASED_OUTPATIENT_CLINIC_OR_DEPARTMENT_OTHER)
Admission: EM | Admit: 2015-06-26 | Discharge: 2015-06-26 | Disposition: A | Discharge status: Home / Self Care | Payer: 59 | Attending: Emergency Medicine | Admitting: Emergency Medicine

## 2015-06-26 ENCOUNTER — Encounter (HOSPITAL_BASED_OUTPATIENT_CLINIC_OR_DEPARTMENT_OTHER): Payer: Self-pay | Admitting: Emergency Medicine

## 2015-06-26 DIAGNOSIS — K2970 Gastritis, unspecified, without bleeding: Secondary | ICD-10-CM | POA: Diagnosis not present

## 2015-06-26 DIAGNOSIS — J449 Chronic obstructive pulmonary disease, unspecified: Secondary | ICD-10-CM | POA: Insufficient documentation

## 2015-06-26 DIAGNOSIS — Z79899 Other long term (current) drug therapy: Secondary | ICD-10-CM | POA: Insufficient documentation

## 2015-06-26 DIAGNOSIS — M549 Dorsalgia, unspecified: Secondary | ICD-10-CM | POA: Diagnosis not present

## 2015-06-26 DIAGNOSIS — R300 Dysuria: Secondary | ICD-10-CM | POA: Insufficient documentation

## 2015-06-26 DIAGNOSIS — Z8659 Personal history of other mental and behavioral disorders: Secondary | ICD-10-CM | POA: Insufficient documentation

## 2015-06-26 DIAGNOSIS — R109 Unspecified abdominal pain: Secondary | ICD-10-CM

## 2015-06-26 DIAGNOSIS — I10 Essential (primary) hypertension: Secondary | ICD-10-CM | POA: Insufficient documentation

## 2015-06-26 DIAGNOSIS — R1012 Left upper quadrant pain: Secondary | ICD-10-CM | POA: Diagnosis present

## 2015-06-26 DIAGNOSIS — R350 Frequency of micturition: Secondary | ICD-10-CM | POA: Insufficient documentation

## 2015-06-26 DIAGNOSIS — Z7951 Long term (current) use of inhaled steroids: Secondary | ICD-10-CM | POA: Insufficient documentation

## 2015-06-26 LAB — COMPREHENSIVE METABOLIC PANEL
ALBUMIN: 4.5 g/dL (ref 3.5–5.0)
ALK PHOS: 40 U/L (ref 38–126)
ALT: 12 U/L — ABNORMAL LOW (ref 14–54)
ANION GAP: 9 (ref 5–15)
AST: 19 U/L (ref 15–41)
BILIRUBIN TOTAL: 0.5 mg/dL (ref 0.3–1.2)
BUN: 30 mg/dL — ABNORMAL HIGH (ref 6–20)
CALCIUM: 9.4 mg/dL (ref 8.9–10.3)
CO2: 24 mmol/L (ref 22–32)
Chloride: 107 mmol/L (ref 101–111)
Creatinine, Ser: 1.31 mg/dL — ABNORMAL HIGH (ref 0.44–1.00)
GFR calc non Af Amer: 41 mL/min — ABNORMAL LOW (ref >60)
GFR, EST AFRICAN AMERICAN: 47 mL/min — AB (ref >60)
Glucose, Bld: 88 mg/dL (ref 65–99)
POTASSIUM: 4.8 mmol/L (ref 3.5–5.1)
SODIUM: 140 mmol/L (ref 135–145)
TOTAL PROTEIN: 7.7 g/dL (ref 6.5–8.1)

## 2015-06-26 LAB — URINALYSIS, ROUTINE W REFLEX MICROSCOPIC
BILIRUBIN URINE: NEGATIVE
GLUCOSE, UA: NEGATIVE mg/dL
HGB URINE DIPSTICK: NEGATIVE
Ketones, ur: NEGATIVE mg/dL
Nitrite: NEGATIVE
PROTEIN: NEGATIVE mg/dL
Specific Gravity, Urine: 1.007 (ref 1.005–1.030)
UROBILINOGEN UA: 0.2 mg/dL (ref 0.0–1.0)
pH: 6.5 (ref 5.0–8.0)

## 2015-06-26 LAB — CBC WITH DIFFERENTIAL/PLATELET
BASOS PCT: 1 %
Basophils Absolute: 0.1 10*3/uL (ref 0.0–0.1)
EOS ABS: 0.4 10*3/uL (ref 0.0–0.7)
Eosinophils Relative: 4 %
HEMATOCRIT: 45.1 % (ref 36.0–46.0)
HEMOGLOBIN: 14.6 g/dL (ref 12.0–15.0)
LYMPHS ABS: 4.4 10*3/uL — AB (ref 0.7–4.0)
Lymphocytes Relative: 40 %
MCH: 31.2 pg (ref 26.0–34.0)
MCHC: 32.4 g/dL (ref 30.0–36.0)
MCV: 96.4 fL (ref 78.0–100.0)
MONO ABS: 0.8 10*3/uL (ref 0.1–1.0)
MONOS PCT: 8 %
NEUTROS PCT: 47 %
Neutro Abs: 5.3 10*3/uL (ref 1.7–7.7)
Platelets: 265 10*3/uL (ref 150–400)
RBC: 4.68 MIL/uL (ref 3.87–5.11)
RDW: 12.9 % (ref 11.5–15.5)
WBC: 11 10*3/uL — ABNORMAL HIGH (ref 4.0–10.5)

## 2015-06-26 LAB — URINE MICROSCOPIC-ADD ON

## 2015-06-26 LAB — LIPASE, BLOOD: Lipase: 34 U/L (ref 22–51)

## 2015-06-26 MED ORDER — ONDANSETRON HCL 4 MG/2ML IJ SOLN
4.0000 mg | Freq: Once | INTRAMUSCULAR | Status: AC
  Administered 2015-06-26: 4 mg via INTRAVENOUS
  Filled 2015-06-26: qty 2

## 2015-06-26 MED ORDER — SODIUM CHLORIDE 0.9 % IV SOLN
Freq: Once | INTRAVENOUS | Status: AC
  Administered 2015-06-26: 18:00:00 via INTRAVENOUS

## 2015-06-26 MED ORDER — IOHEXOL 300 MG/ML  SOLN
25.0000 mL | Freq: Once | INTRAMUSCULAR | Status: AC | PRN
  Administered 2015-06-26: 25 mL via ORAL

## 2015-06-26 MED ORDER — SUCRALFATE 1 G PO TABS: 1.0000 g | ORAL_TABLET | Freq: Four times a day (QID) | ORAL | Status: DC

## 2015-06-26 MED ORDER — IOHEXOL 300 MG/ML  SOLN
100.0000 mL | Freq: Once | INTRAMUSCULAR | Status: AC | PRN
  Administered 2015-06-26: 100 mL via INTRAVENOUS

## 2015-06-26 MED ORDER — MORPHINE SULFATE (PF) 4 MG/ML IV SOLN
4.0000 mg | INTRAVENOUS | Status: DC | PRN
  Administered 2015-06-26: 4 mg via INTRAVENOUS
  Filled 2015-06-26: qty 1

## 2015-06-26 NOTE — Discharge Instructions (Signed)

## 2015-06-26 NOTE — ED Notes (Signed)
Pt seen by PMD on Tuesday for upper abdominal pain.  Sent home with antiacid and advised to return if new or worse symptoms.  Since yesterday, pt began having back pain on left side along with painful frequent urination.

## 2015-06-26 NOTE — ED Provider Notes (Addendum)
CSN: [id number]     Arrival date & time 06/26/15  1653 History  By signing my name below, I, Helane Gunther, attest that this documentation has been prepared under the direction and in the presence of Tanna Furry, MD. Electronically Signed: Helane Gunther, ED Scribe. 06/26/2015. 8:32 PM.    Chief Complaint  Patient presents with  . Dysuria  . Back Pain   The history is provided by the patient. No language interpreter was used.   HPI Comments: Catherine Phillips is a 69 y.o. female former smoker (quit 4 years ago) with a PMHx of Emphysema, COPD, and HTN who presents to the Emergency Department complaining of LUQ abdominal pain onset 6 days ago. She states she saw her PCP the next day where she underwent several blood tests which came back negative. She notes she was sent home with an antacid to be taken daily. She reports associated frequency, dysuria, nausea, and diarrhea (1episode 5 days ago, resolved). Pt notes she takes an antacid as needed for occasional GERD. She denies a PMHx of liver, kidney or gastric ulcer issues. She states she drinks 3-4 cups of coffee per day, as well as drinking "a lot of coke". Pt denies vomiting.   Past Medical History  Diagnosis Date  . COPD (chronic obstructive pulmonary disease) ([place])   . Hypertension   . Emphysema of lung ([place])   . Anxiety 06/24/2012  . Essential hypertension 06/24/2012  . Emphysema 06/24/2012  . History of Left leg claudication 06/24/2012  . Shortness of breath dyspnea    Past Surgical History  Procedure Laterality Date  . Abdominal aortagram  12/09/12  . Abdominal aortagram N/A 12/09/2012    Procedure: ABDOMINAL Maxcine Ham;  Surgeon: Angelia Mould, MD;  Location: [facility] CATH LAB;  Service: Cardiovascular;  Laterality: N/A;   Family History  Problem Relation Age of Onset  . Cancer Mother   . Heart attack Father   . Deep vein thrombosis Father   . Hyperlipidemia Father   . Hypertension Father    Social History  Substance Use Topics   . Smoking status: Former Smoker -- 1.50 packs/day for 30 years    Types: Cigarettes    Quit date: 12/30/2009  . Smokeless tobacco: Never Used  . Alcohol Use: No   OB History    No data available     Review of Systems  Constitutional: Negative for fever, chills, diaphoresis, appetite change and fatigue.  HENT: Negative for mouth sores, sore throat and trouble swallowing.   Eyes: Negative for visual disturbance.  Respiratory: Negative for cough, chest tightness, shortness of breath and wheezing.   Cardiovascular: Negative for chest pain.  Gastrointestinal: Positive for nausea and abdominal pain. Negative for vomiting, diarrhea and abdominal distention.  Endocrine: Negative for polydipsia, polyphagia and polyuria.  Genitourinary: Positive for dysuria and frequency. Negative for hematuria.  Musculoskeletal: Positive for back pain. Negative for gait problem.  Skin: Negative for color change, pallor and rash.  Neurological: Negative for dizziness, syncope, light-headedness and headaches.  Hematological: Does not bruise/bleed easily.  Psychiatric/Behavioral: Negative for behavioral problems and confusion.    Allergies  Codeine and Levaquin  Home Medications   Prior to Admission medications   Medication Sig Start Date End Date Taking? Authorizing Provider  amLODipine (NORVASC) 10 MG tablet TAKE 1 TABLET EVERY DAY 04/08/15   Sean Hommel, DO  budesonide-formoterol (SYMBICORT) 160-4.5 MCG/ACT inhaler Inhale 2 puffs into the lungs 2 (two) times daily. 05/31/15   Elsie Stain, MD  celecoxib (CELEBREX)  100 MG capsule Take 1 capsule (100 mg total) by mouth 2 (two) times daily as needed (arthritis). 01/18/15   Marcial Pacas, DO  Esomeprazole Magnesium (NEXIUM 24HR PO) Take by mouth.    Historical Provider, MD  ibuprofen (ADVIL,MOTRIN) 200 MG tablet Take 400 mg by mouth 2 (two) times daily as needed for headache.    Historical Provider, MD  lisinopril (PRINIVIL,ZESTRIL) 10 MG tablet Take 1  tablet (10 mg total) by mouth daily. 04/19/15   Sean Hommel, DO  LYRICA 50 MG capsule TAKE ONE CAPSULE BY MOUTH 3 TIMES A DAY Patient taking differently: TAKE ONE CAPSULE BY MOUTH every evening. 12/28/14   Marcial Pacas, DO  Omega-3 Fatty Acids (FISH OIL PO) Take 1 tablet by mouth 2 (two) times daily.    Historical Provider, MD  pantoprazole (PROTONIX) 40 MG tablet Take 1 tablet (40 mg total) by mouth daily. 06/22/15   Marcial Pacas, DO  Pitavastatin Calcium (LIVALO) 4 MG TABS Take 1 tablet (4 mg total) by mouth every other day. 04/20/15   Sean Hommel, DO  sucralfate (CARAFATE) 1 G tablet Take 1 tablet (1 g total) by mouth 4 (four) times daily. 06/26/15   Tanna Furry, MD   BP 135/57 mmHg  Pulse 70  Temp(Src) 97.5 F (36.4 C) (Oral)  Resp 18  Ht 5\' 2"  (1.575 m)  Wt 110 lb (49.896 kg)  BMI 20.11 kg/m2  SpO2 98% Physical Exam  Constitutional: She is oriented to person, place, and time. She appears well-developed and well-nourished. No distress.  HENT:  Head: Normocephalic.  Eyes: Conjunctivae are normal. Pupils are equal, round, and reactive to light. No scleral icterus.  Neck: Normal range of motion. Neck supple. No thyromegaly present.  Cardiovascular: Normal rate and regular rhythm.  Exam reveals no gallop and no friction rub.   No murmur heard. Pulmonary/Chest: Effort normal and breath sounds normal. No respiratory distress. She has no wheezes. She has no rales.  Abdominal: Soft. Bowel sounds are normal. She exhibits no distension. There is tenderness. There is no rebound.  LUQ TTP  Musculoskeletal: Normal range of motion.  Neurological: She is alert and oriented to person, place, and time.  Skin: Skin is warm and dry. No rash noted.  Psychiatric: She has a normal mood and affect. Her behavior is normal.    ED Course  Procedures  DIAGNOSTIC STUDIES: Oxygen Saturation is 92% on RA, low by my interpretation.    COORDINATION OF CARE: 5:52 PM - Discussed plans to order diagnostic studies and  imaging. Will order pain medication. Pt advised of plan for treatment and pt agrees.  Labs Review Labs Reviewed  URINALYSIS, ROUTINE W REFLEX MICROSCOPIC (NOT AT [facility]) - Abnormal; Notable for the following:    Leukocytes, UA TRACE (*)    All other components within normal limits  CBC WITH DIFFERENTIAL/PLATELET - Abnormal; Notable for the following:    WBC 11.0 (*)    Lymphs Abs 4.4 (*)    All other components within normal limits  COMPREHENSIVE METABOLIC PANEL - Abnormal; Notable for the following:    BUN 30 (*)    Creatinine, Ser 1.31 (*)    ALT 12 (*)    GFR calc non Af Amer 41 (*)    GFR calc Af Amer 47 (*)    All other components within normal limits  URINE MICROSCOPIC-ADD ON  LIPASE, BLOOD    Imaging Review Ct Abdomen Pelvis W Contrast  06/26/2015   CLINICAL DATA:  Acute onset of left  upper quadrant abdominal pain, with increased urinary frequency, dysuria, nausea and diarrhea. Initial encounter.  EXAM: CT ABDOMEN AND PELVIS WITH CONTRAST  TECHNIQUE: Multidetector CT imaging of the abdomen and pelvis was performed using the standard protocol following bolus administration of intravenous contrast.  CONTRAST:  134mL OMNIPAQUE IOHEXOL 300 MG/ML  SOLN  COMPARISON:  CT of the abdomen and pelvis from 07/11/2014  FINDINGS: Bilateral emphysematous change is noted. Mild scarring is seen at the left lingula. Mild calcification is noted along the ascending thoracic aorta.  The liver and spleen are unremarkable in appearance. The gallbladder is within normal limits. The pancreas and adrenal glands are unremarkable.  Small bilateral parenchymal calcifications are seen. Left renal cysts measure up to 7 mm in size. The kidneys are otherwise unremarkable. There is no evidence of hydronephrosis. No renal or ureteral stones are seen. No perinephric stranding is appreciated.  No free fluid is identified. The small bowel is unremarkable in appearance. The stomach is within normal limits. No acute vascular  abnormalities are seen. Diffuse calcification is noted along the abdominal aorta and its branches, including along the superior mesenteric artery and both renal arteries.  The appendix is normal in caliber, without evidence of appendicitis. Scattered diverticulosis is noted along the proximal sigmoid colon, without evidence of diverticulitis.  The bladder is mildly distended and grossly unremarkable. The uterus is unremarkable in appearance. No suspicious adnexal masses are seen. The ovaries are grossly symmetric. No inguinal lymphadenopathy is seen.  No acute osseous abnormalities are identified. There is grade 2 anterolisthesis of L5 on S1, reflecting mild underlying facet disease.  IMPRESSION: 1. No acute abnormality seen to explain the patient's symptoms. 2. Diffuse calcification along the abdominal aorta and its branches, including along the superior mesenteric artery and both renal arteries. 3. Small left renal cysts noted. 4. Bilateral emphysematous change seen at the lung bases, with mild scarring at the left lingula. 5. Scattered diverticulosis along the proximal sigmoid colon, without evidence of diverticulitis.   Electronically Signed   By: Garald Balding M.D.   On: 06/26/2015 20:23   I have personally reviewed and evaluated these images and lab results as part of my medical decision-making.   EKG Interpretation None      MDM   Final diagnoses:  Abdominal pain, unspecified abdominal location  Gastritis   I personally performed the services described in this documentation, which was scribed in my presence. The recorded information has been reviewed and is accurate.   Tanna Furry, MD 06/26/15 2036  Tanna Furry, MD 06/26/15 2037

## 2015-07-19 ENCOUNTER — Ambulatory Visit (INDEPENDENT_AMBULATORY_CARE_PROVIDER_SITE_OTHER): Payer: 59 | Admitting: Family Medicine

## 2015-07-19 ENCOUNTER — Encounter: Payer: Self-pay | Admitting: Family Medicine

## 2015-07-19 VITALS — BP 156/63 | HR 97 | Temp 97.6°F | Wt 110.0 lb

## 2015-07-19 DIAGNOSIS — J029 Acute pharyngitis, unspecified: Secondary | ICD-10-CM | POA: Diagnosis not present

## 2015-07-19 MED ORDER — LIDOCAINE VISCOUS 2 % MT SOLN: 20.0000 mL | OROMUCOSAL | Status: DC | PRN

## 2015-07-19 MED ORDER — AMOXICILLIN-POT CLAVULANATE 500-125 MG PO TABS: ORAL_TABLET | ORAL | Status: AC

## 2015-07-19 NOTE — Progress Notes (Signed)
CC: Catherine Phillips is a 69 y.o. female is here for Sore Throat   Subjective: HPI:  Sore throat present for the past 3 days. Has not gotten better or worse since onset. Slightly improved with Tea, no benefit from over-the-counter Chloraseptic spray or ibuprofen. Denies pain with moving the neck. Denies difficulty swallowing. Has had a mild cough since onset. Accompanied by nasal congestion. Nothing else seems to make symptoms better or worse. Moderate in severity and nonradiating. Denies shortness of breath or wheezing. No fevers but positive for chills   Review Of Systems Outlined In HPI  Past Medical History  Diagnosis Date  . COPD (chronic obstructive pulmonary disease) ([place])   . Hypertension   . Emphysema of lung ([place])   . Anxiety 06/24/2012  . Essential hypertension 06/24/2012  . Emphysema 06/24/2012  . History of Left leg claudication 06/24/2012  . Shortness of breath dyspnea     Past Surgical History  Procedure Laterality Date  . Abdominal aortagram  12/09/12  . Abdominal aortagram N/A 12/09/2012    Procedure: ABDOMINAL Maxcine Ham;  Surgeon: Angelia Mould, MD;  Location: [facility] CATH LAB;  Service: Cardiovascular;  Laterality: N/A;   Family History  Problem Relation Age of Onset  . Cancer Mother   . Heart attack Father   . Deep vein thrombosis Father   . Hyperlipidemia Father   . Hypertension Father     Social History   Social History  . Marital Status: Married    Spouse Name: N/A  . Number of Children: N/A  . Years of Education: N/A   Occupational History  . Not on file.   Social History Main Topics  . Smoking status: Former Smoker -- 1.50 packs/day for 30 years    Types: Cigarettes    Quit date: 12/30/2009  . Smokeless tobacco: Never Used  . Alcohol Use: No  . Drug Use: No  . Sexual Activity: Not on file   Other Topics Concern  . Not on file   Social History Narrative     Objective: BP 156/63 mmHg  Pulse 97  Temp(Src) 97.6 F (36.4 C) (Oral)  Wt  110 lb (49.896 kg)  General: Alert and Oriented, No Acute Distress HEENT: Pupils equal, round, reactive to light. Conjunctivae clear.  External ears unremarkable, canals clear with intact TMs with appropriate landmarks.  Middle ear appears open without effusion. Pink inferior turbinates.  Moist mucous membranes, pharynx without inflammation nor lesions Other than mild postnasal drip  Neck supple without palpable lymphadenopathy nor abnormal masses. Lungs: Clear to auscultation bilaterally, no wheezing/ronchi/rales.  Comfortable work of breathing. Good air movement. Extremities: No peripheral edema.  Strong peripheral pulses.  Mental Status: No depression, anxiety, nor agitation. Skin: Warm and dry.  Assessment & Plan: Catherine Phillips was seen today for sore throat.  Diagnoses and all orders for this visit:  Sore throat  Other orders -     amoxicillin-clavulanate (AUGMENTIN) 500-125 MG tablet; Take one by mouth every 8 hours for ten total days. -     lidocaine (XYLOCAINE) 2 % solution; Use as directed 20 mLs in the mouth or throat as needed for mouth pain (Use as a gargle.).   Suspect sore throat secondary to postnasal drip therefore focusing on clearing sinuses with Augmentin, call if no better by Thursday next up with the prednisone.  Return if symptoms worsen or fail to improve.

## 2015-07-23 ENCOUNTER — Other Ambulatory Visit: Payer: Self-pay | Admitting: Family Medicine

## 2015-08-04 ENCOUNTER — Other Ambulatory Visit: Payer: Self-pay | Admitting: Family Medicine

## 2015-08-19 ENCOUNTER — Other Ambulatory Visit: Payer: Self-pay

## 2015-08-19 DIAGNOSIS — Z1231 Encounter for screening mammogram for malignant neoplasm of breast: Secondary | ICD-10-CM

## 2015-09-22 ENCOUNTER — Ambulatory Visit (INDEPENDENT_AMBULATORY_CARE_PROVIDER_SITE_OTHER): Payer: 59

## 2015-09-22 DIAGNOSIS — Z1231 Encounter for screening mammogram for malignant neoplasm of breast: Secondary | ICD-10-CM

## 2015-10-04 ENCOUNTER — Ambulatory Visit (INDEPENDENT_AMBULATORY_CARE_PROVIDER_SITE_OTHER): Payer: 59 | Admitting: Osteopathic Medicine

## 2015-10-04 ENCOUNTER — Ambulatory Visit (INDEPENDENT_AMBULATORY_CARE_PROVIDER_SITE_OTHER): Payer: 59

## 2015-10-04 ENCOUNTER — Encounter: Payer: Self-pay | Admitting: Osteopathic Medicine

## 2015-10-04 VITALS — BP 129/92 | HR 103 | Temp 98.3°F | Ht 62.0 in | Wt 111.0 lb

## 2015-10-04 DIAGNOSIS — J441 Chronic obstructive pulmonary disease with (acute) exacerbation: Secondary | ICD-10-CM

## 2015-10-04 DIAGNOSIS — R05 Cough: Secondary | ICD-10-CM

## 2015-10-04 DIAGNOSIS — J069 Acute upper respiratory infection, unspecified: Secondary | ICD-10-CM

## 2015-10-04 DIAGNOSIS — B9789 Other viral agents as the cause of diseases classified elsewhere: Secondary | ICD-10-CM

## 2015-10-04 DIAGNOSIS — J449 Chronic obstructive pulmonary disease, unspecified: Secondary | ICD-10-CM | POA: Diagnosis not present

## 2015-10-04 DIAGNOSIS — R059 Cough, unspecified: Secondary | ICD-10-CM

## 2015-10-04 MED ORDER — AMOXICILLIN-POT CLAVULANATE 875-125 MG PO TABS: 1.0000 | ORAL_TABLET | Freq: Two times a day (BID) | ORAL | Status: DC

## 2015-10-04 MED ORDER — BENZONATATE 200 MG PO CAPS: 200.0000 mg | ORAL_CAPSULE | Freq: Three times a day (TID) | ORAL | Status: DC | PRN

## 2015-10-04 MED ORDER — PREDNISONE 20 MG PO TABS: 20.0000 mg | ORAL_TABLET | Freq: Two times a day (BID) | ORAL | Status: DC

## 2015-10-04 NOTE — Progress Notes (Signed)
HPI: Catherine Phillips is a 70 y.o. female who presents to [facility]  today for chief complaint of:  Chief Complaint  Patient presents with  . Cough    . Location: chest & sinuses . Quality: coughing, runny nose . Severity: marked . Duration: 5 - 7 days but cough got bad few days ago . Modifying factors: has tried the following medications: Symbicort , Augmentin  without relief . Assoc signs/symptoms: no fever/chills, no productive cough, No  body aches, No  GI upset   Past medical, social and family history reviewed: Past Medical History  Diagnosis Date  . COPD (chronic obstructive pulmonary disease) ([place])   . Hypertension   . Emphysema of lung ([place])   . Anxiety 06/24/2012  . Essential hypertension 06/24/2012  . Emphysema 06/24/2012  . History of Left leg claudication 06/24/2012  . Shortness of breath dyspnea    Past Surgical History  Procedure Laterality Date  . Abdominal aortagram  12/09/12  . Abdominal aortagram N/A 12/09/2012    Procedure: ABDOMINAL Maxcine Ham;  Surgeon: Angelia Mould, MD;  Location: [facility] CATH LAB;  Service: Cardiovascular;  Laterality: N/A;   Social History  Substance Use Topics  . Smoking status: Former Smoker -- 1.50 packs/day for 30 years    Types: Cigarettes    Quit date: 12/30/2009  . Smokeless tobacco: Never Used  . Alcohol Use: No   Family History  Problem Relation Age of Onset  . Cancer Mother   . Heart attack Father   . Deep vein thrombosis Father   . Hyperlipidemia Father   . Hypertension Father     Current Outpatient Prescriptions  Medication Sig Dispense Refill  . amLODipine (NORVASC) 10 MG tablet TAKE 1 TABLET EVERY DAY 90 tablet 0  . budesonide-formoterol (SYMBICORT) 160-4.5 MCG/ACT inhaler Inhale 2 puffs into the lungs 2 (two) times daily. 1 Inhaler 11  . celecoxib (CELEBREX) 100 MG capsule Take 1 capsule (100 mg total) by mouth 2 (two) times daily as needed (arthritis). 45 capsule 4  .  Esomeprazole Magnesium (NEXIUM 24HR PO) Take by mouth.    [name] ibuprofen (ADVIL,MOTRIN) 200 MG tablet Take 400 mg by mouth 2 (two) times daily as needed for headache.    . lidocaine (XYLOCAINE) 2 % solution Use as directed 20 mLs in the mouth or throat as needed for mouth pain (Use as a gargle.). 100 mL 2  . lisinopril (PRINIVIL,ZESTRIL) 10 MG tablet Take 1 tablet (10 mg total) by mouth daily. 90 tablet 3  . LYRICA 50 MG capsule TAKE ONE CAPSULE 3 TIMES A DAY 270 capsule 1  . Omega-3 Fatty Acids (FISH OIL PO) Take 1 tablet by mouth 2 (two) times daily.    . pantoprazole (PROTONIX) 40 MG tablet Take 1 tablet (40 mg total) by mouth daily. 90 tablet 3  . Pitavastatin Calcium (LIVALO) 4 MG TABS Take 1 tablet (4 mg total) by mouth every other day. 90 tablet 1  . sucralfate (CARAFATE) 1 G tablet Take 1 tablet (1 g total) by mouth 4 (four) times daily. 60 tablet 0   No current facility-administered medications for this visit.   Allergies  Allergen Reactions  . Codeine Nausea And Vomiting  . Levaquin [Levofloxacin In D5w] Itching and Rash      Review of Systems: CONSTITUTIONAL: no fever/chills HEAD/EYES/EARS/NOSE/THROAT: yes headache, no vision change or hearing change, yes sore throat CARDIAC: No chest pain/pressure/palpitations, no orthopnea RESPIRATORY: yes cough, no shortness of breath GASTROINTESTINAL: no nausea,  no vomiting, no abdominal pain/blood in stool/diarrhea/constipation MUSCULOSKELETAL: no myalgia/arthralgia   Exam:  BP 129/92 mmHg  Pulse 103  Temp(Src) 98.3 F (36.8 C) (Oral)  Ht 5\' 2"  (1.575 m)  Wt 111 lb (50.349 kg)  BMI 20.30 kg/m2 Constitutional: VSS, see above. General Appearance: alert, well-developed, well-nourished, NAD Eyes: Normal lids and conjunctive, non-icteric sclera, PERRLA Ears, Nose, Mouth, Throat: Normal external inspection ears/nares/mouth/lips/gums, unable to visualize d/t cerumen TM, MMM; posterior pharynx without erythema, without exudate Neck: No  masses, trachea midline. No thyroid enlargement/tenderness/mass appreciated, normal lymph nodes Respiratory: Normal respiratory effort. No  Wheeze/rhonchi/rales, diminshed breath sounds bilaterally Cardiovascular: S1/S2 normal, no murmur/rub/gallop auscultated. RRR.    CXR on personal review  (comapred to 05/02/15 appears consistent) Cardiomediastinal silhouette/heart size: normal Obvious bony abnormality: none Infiltrate: small linear infiltrate vs scar tissue, visible on old CXR  Mass or other opacity: none Atelectasis: none Diaphragms: abnormal - bit flattened Lateral view: normal  (+)c/w scar tissue Images were reviewed with the patient. Pt counseled that radiologist will review the images as well, our office will call if the formal read reveals any significant findings other than what has been noted above.    ASSESSMENT/PLAN: Hang on to abx Rx and fill if no better in next 3 days or sooner if worse, otherwise most likely viral URI w/ COPD exacerbation, supportive care reviewed, ER/RTC precautions reviewed. Cont Symbicort, pt reports she also has Albuterol as needed.   COPD exacerbation (HCC) - Plan: predniSONE (DELTASONE) 20 MG tablet, amoxicillin-clavulanate (AUGMENTIN) 875-125 MG tablet  Cough - Plan: DG Chest 2 View, benzonatate (TESSALON) 200 MG capsule  Viral URI with cough    Return if symptoms worsen or fail to improve.

## 2015-10-13 ENCOUNTER — Other Ambulatory Visit: Payer: Self-pay | Admitting: Family Medicine

## 2015-10-14 ENCOUNTER — Ambulatory Visit (INDEPENDENT_AMBULATORY_CARE_PROVIDER_SITE_OTHER): Payer: 59 | Admitting: Family Medicine

## 2015-10-14 ENCOUNTER — Encounter: Payer: Self-pay | Admitting: Family Medicine

## 2015-10-14 VITALS — BP 127/65 | HR 74 | Temp 98.1°F | Wt 114.0 lb

## 2015-10-14 DIAGNOSIS — J441 Chronic obstructive pulmonary disease with (acute) exacerbation: Secondary | ICD-10-CM

## 2015-10-14 MED ORDER — PREDNISONE 20 MG PO TABS: ORAL_TABLET | ORAL | Status: AC

## 2015-10-14 MED ORDER — AZITHROMYCIN 250 MG PO TABS
ORAL_TABLET | ORAL | Status: AC
Start: 2015-10-14 — End: 2015-10-19

## 2015-10-14 NOTE — Progress Notes (Signed)
CC: Catherine Phillips is a 70 y.o. female is here for Cough and Shortness of Breath   Subjective: HPI:  Last week started augmentin and prednisone for a cough.  Does not seem to be helping much.  Continues to have sob, wheezing, and nonproductive cough. Worse with activity.  Mild improvement for a few hours when using albuterol. Denies fevers, chills, nausea, chest pain, facial pressure, nor nasal congestion.   Review Of Systems Outlined In HPI  Past Medical History  Diagnosis Date  . COPD (chronic obstructive pulmonary disease) ([place])   . Hypertension   . Emphysema of lung ([place])   . Anxiety 06/24/2012  . Essential hypertension 06/24/2012  . Emphysema 06/24/2012  . History of Left leg claudication 06/24/2012  . Shortness of breath dyspnea     Past Surgical History  Procedure Laterality Date  . Abdominal aortagram  12/09/12  . Abdominal aortagram N/A 12/09/2012    Procedure: ABDOMINAL Maxcine Ham;  Surgeon: Angelia Mould, MD;  Location: [facility] CATH LAB;  Service: Cardiovascular;  Laterality: N/A;   Family History  Problem Relation Age of Onset  . Cancer Mother   . Heart attack Father   . Deep vein thrombosis Father   . Hyperlipidemia Father   . Hypertension Father     Social History   Social History  . Marital Status: Married    Spouse Name: N/A  . Number of Children: N/A  . Years of Education: N/A   Occupational History  . Not on file.   Social History Main Topics  . Smoking status: Former Smoker -- 1.50 packs/day for 30 years    Types: Cigarettes    Quit date: 12/30/2009  . Smokeless tobacco: Never Used  . Alcohol Use: No  . Drug Use: No  . Sexual Activity: Not on file   Other Topics Concern  . Not on file   Social History Narrative     Objective: BP 127/65 mmHg  Pulse 74  Temp(Src) 98.1 F (36.7 C) (Oral)  Wt 114 lb (51.71 kg)  SpO2 91%  General: Alert and Oriented, No Acute Distress HEENT: Pupils equal, round, reactive to light. Conjunctivae clear.   Moist mucous membranes Lungs: Clear but distant breath sounds, no wheezing/ronchi/rales.  Comfortable work of breathing. Good air movement. Cardiac: Regular rate and rhythm. Normal S1/S2.  No murmurs, rubs, nor gallops.   Extremities: No peripheral edema.  Strong peripheral pulses.  Mental Status: No depression, anxiety, nor agitation. Skin: Warm and dry.  Assessment & Plan: [name] was seen today for cough and shortness of breath.  Diagnoses and all orders for this visit:  COPD exacerbation ([place]) -     azithromycin (ZITHROMAX) 250 MG tablet; Take two tabs at once on day 1, then one tab daily on days 2-5. -     predniSONE (DELTASONE) 20 MG tablet; Three tabs daily days 1-3, two tabs daily days 4-6, one tab daily days 7-9, half tab daily days 10-13.   Stop Augmentin, swtich to zithromax, bumping up prednisone, take the next three days off.  Return if symptoms worsen or fail to improve.

## 2015-12-09 ENCOUNTER — Telehealth: Payer: Self-pay

## 2015-12-09 NOTE — Telephone Encounter (Signed)
[facility] and they would like for a written order to be faxed.  (FYI for me [phone])

## 2015-12-09 NOTE — Telephone Encounter (Signed)
I'm ok with this, can you find out what supplier is providing this and send a VO to discontinue oxygen.

## 2015-12-09 NOTE — Telephone Encounter (Signed)
Patient's husband, Aarionna Azizi, called and states the patient has not used the oxygen in 4 months. He would like it discontinued so he can stop paying for it monthly. He states his wife feels fine and has not needed the oxygen. Please advise.

## 2015-12-10 MED ORDER — AMBULATORY NON FORMULARY MEDICATION: Status: DC

## 2015-12-10 NOTE — Telephone Encounter (Signed)
[place], Rx placed in in-box ready for pickup/faxing.  

## 2015-12-10 NOTE — Telephone Encounter (Signed)
Rx faxed

## 2015-12-10 NOTE — Addendum Note (Signed)
Addended by: Marcial Pacas on: 12/10/2015 08:02 AM   Modules accepted: Orders

## 2015-12-16 ENCOUNTER — Ambulatory Visit (INDEPENDENT_AMBULATORY_CARE_PROVIDER_SITE_OTHER): Payer: 59 | Admitting: Pulmonary Disease

## 2015-12-16 ENCOUNTER — Encounter: Payer: Self-pay | Admitting: Pulmonary Disease

## 2015-12-16 VITALS — BP 125/66 | HR 71 | Ht 62.0 in | Wt 116.0 lb

## 2015-12-16 DIAGNOSIS — R918 Other nonspecific abnormal finding of lung field: Secondary | ICD-10-CM | POA: Diagnosis not present

## 2015-12-16 DIAGNOSIS — J439 Emphysema, unspecified: Secondary | ICD-10-CM | POA: Diagnosis not present

## 2015-12-16 NOTE — Patient Instructions (Signed)
Ct chest - no contrast Stay onsymbicort

## 2015-12-16 NOTE — Progress Notes (Signed)
   Subjective:    Patient ID: Catherine Phillips, female    DOB: 1946/03/16, 70 y.o.   MRN: [id number]  HPI For FU of mod COPD PW pt 45 pys, quit smoking 2011   Chief Complaint  Patient presents with  . Follow-up    doing well. no concerns.    On her last visit, Spiriva was stopped She has done well since then-dyspnea is at baseline no chest colds Summer is a bad time for her-and she stays mostly indoors  I reviewed her CT chest from 07/2014 and compared to 04/2014-there is a new nodule in the right upper lobe noted  Significant tests/ events   10/2014 FEV1 60% CT chest 07/2014 - changes of ILD , stable. Mod emphysema, nodules decreased compared to 04/2014 except new one RUL  Review of Systems  Patient denies significant dyspnea,cough, hemoptysis,  chest pain, palpitations, pedal edema, orthopnea, paroxysmal nocturnal dyspnea, lightheadedness, nausea, vomiting, abdominal or  leg pains      Objective:   Physical Exam  Gen. Pleasant, well-nourished, in no distress ENT - no lesions, no post nasal drip Neck: No JVD, no thyromegaly, no carotid bruits Lungs: no use of accessory muscles, no dullness to percussion, clear without rales or rhonchi  Cardiovascular: Rhythm regular, heart sounds  normal, no murmurs or gallops, no peripheral edema Musculoskeletal: No deformities, no cyanosis or clubbing        Assessment & Plan:

## 2015-12-16 NOTE — Assessment & Plan Note (Signed)
Stay on symbicort 

## 2015-12-16 NOTE — Assessment & Plan Note (Signed)
FU Ct chest - no contrast - esp RUL nodule was new in 07/2014

## 2015-12-21 ENCOUNTER — Ambulatory Visit (HOSPITAL_BASED_OUTPATIENT_CLINIC_OR_DEPARTMENT_OTHER)
Admission: RE | Admit: 2015-12-21 | Discharge: 2015-12-21 | Disposition: A | Discharge status: Home / Self Care | Payer: 59 | Source: Ambulatory Visit | Attending: Pulmonary Disease | Admitting: Pulmonary Disease

## 2015-12-21 DIAGNOSIS — J439 Emphysema, unspecified: Secondary | ICD-10-CM | POA: Diagnosis not present

## 2015-12-21 DIAGNOSIS — R918 Other nonspecific abnormal finding of lung field: Secondary | ICD-10-CM | POA: Insufficient documentation

## 2015-12-28 ENCOUNTER — Telehealth: Payer: Self-pay | Admitting: Pulmonary Disease

## 2015-12-28 DIAGNOSIS — R918 Other nonspecific abnormal finding of lung field: Secondary | ICD-10-CM

## 2015-12-28 NOTE — Telephone Encounter (Signed)
Notes Recorded by Rigoberto Noel, MD on 12/21/2015 at 5:27 PM 2 new nodules compared to 2016 CT Rpt CT scan -with contrast in 3 mnths   Details        Called and spoke with the pt. Reviewed results and recs. Pt voiced understanding and had no further questions. Order placed. Nothing further needed.

## 2016-01-14 ENCOUNTER — Other Ambulatory Visit: Payer: Self-pay | Admitting: Family Medicine

## 2016-02-22 ENCOUNTER — Telehealth: Payer: Self-pay | Admitting: Pulmonary Disease

## 2016-02-22 DIAGNOSIS — J439 Emphysema, unspecified: Secondary | ICD-10-CM

## 2016-02-22 NOTE — Telephone Encounter (Signed)
Patient needs BMET for CT scan. Ordered BMET. Nothing further needed.

## 2016-03-01 ENCOUNTER — Other Ambulatory Visit (INDEPENDENT_AMBULATORY_CARE_PROVIDER_SITE_OTHER): Payer: 59

## 2016-03-01 ENCOUNTER — Other Ambulatory Visit: Payer: Self-pay | Admitting: Pulmonary Disease

## 2016-03-01 DIAGNOSIS — J439 Emphysema, unspecified: Secondary | ICD-10-CM

## 2016-03-01 DIAGNOSIS — R918 Other nonspecific abnormal finding of lung field: Secondary | ICD-10-CM

## 2016-03-01 LAB — BASIC METABOLIC PANEL
BUN: 24 mg/dL — AB (ref 6–23)
CALCIUM: 9.6 mg/dL (ref 8.4–10.5)
CO2: 27 mEq/L (ref 19–32)
CREATININE: 1.55 mg/dL — AB (ref 0.40–1.20)
Chloride: 106 mEq/L (ref 96–112)
GFR: 35.16 mL/min — AB (ref >60.00)
Glucose, Bld: 91 mg/dL (ref 70–99)
Potassium: 5.8 mEq/L — ABNORMAL HIGH (ref 3.5–5.1)
Sodium: 141 mEq/L (ref 135–145)

## 2016-03-01 NOTE — Progress Notes (Signed)
Quick Note:  Called spoke with pt. Reviewed results and recs. Pt voiced understanding and had no further questions. CT order placed. ______

## 2016-03-27 ENCOUNTER — Telehealth: Payer: Self-pay | Admitting: Pulmonary Disease

## 2016-03-27 NOTE — Telephone Encounter (Signed)
Peer-to-peer performed CT approved Confirmation # [id number]

## 2016-03-28 ENCOUNTER — Other Ambulatory Visit (HOSPITAL_BASED_OUTPATIENT_CLINIC_OR_DEPARTMENT_OTHER): Payer: 59

## 2016-03-28 ENCOUNTER — Ambulatory Visit (HOSPITAL_BASED_OUTPATIENT_CLINIC_OR_DEPARTMENT_OTHER)
Admission: RE | Admit: 2016-03-28 | Discharge: 2016-03-28 | Disposition: A | Discharge status: Home / Self Care | Payer: 59 | Source: Ambulatory Visit | Attending: Pulmonary Disease | Admitting: Pulmonary Disease

## 2016-03-28 DIAGNOSIS — J432 Centrilobular emphysema: Secondary | ICD-10-CM | POA: Diagnosis not present

## 2016-03-28 DIAGNOSIS — R918 Other nonspecific abnormal finding of lung field: Secondary | ICD-10-CM | POA: Diagnosis not present

## 2016-04-04 NOTE — Telephone Encounter (Signed)
Patient called states someone called her yesterday to give her results of CT scan and was told to call back today -prm

## 2016-04-04 NOTE — Telephone Encounter (Signed)
Patient aware of CT results. Nothing further needed.

## 2016-04-12 ENCOUNTER — Encounter: Payer: Self-pay | Admitting: Family Medicine

## 2016-04-12 ENCOUNTER — Ambulatory Visit (INDEPENDENT_AMBULATORY_CARE_PROVIDER_SITE_OTHER): Payer: 59 | Admitting: Family Medicine

## 2016-04-12 VITALS — BP 145/82 | HR 71 | Wt 112.0 lb

## 2016-04-12 DIAGNOSIS — K219 Gastro-esophageal reflux disease without esophagitis: Secondary | ICD-10-CM

## 2016-04-12 DIAGNOSIS — J439 Emphysema, unspecified: Secondary | ICD-10-CM

## 2016-04-12 DIAGNOSIS — I10 Essential (primary) hypertension: Secondary | ICD-10-CM | POA: Diagnosis not present

## 2016-04-12 MED ORDER — LISINOPRIL 10 MG PO TABS: 10.0000 mg | ORAL_TABLET | Freq: Every day | ORAL | 3 refills | Status: DC

## 2016-04-12 MED ORDER — PANTOPRAZOLE SODIUM 40 MG PO TBEC: 80.0000 mg | DELAYED_RELEASE_TABLET | Freq: Every day | ORAL | 1 refills | Status: DC

## 2016-04-12 MED ORDER — AMLODIPINE BESYLATE 10 MG PO TABS
10.0000 mg | ORAL_TABLET | Freq: Every day | ORAL | 0 refills | Status: DC
Start: 2016-04-12 — End: 2016-06-15

## 2016-04-12 MED ORDER — BUDESONIDE-FORMOTEROL FUMARATE 160-4.5 MCG/ACT IN AERO: 2.0000 | INHALATION_SPRAY | Freq: Two times a day (BID) | RESPIRATORY_TRACT | 0 refills | Status: DC

## 2016-04-12 NOTE — Progress Notes (Signed)
CC: Catherine Phillips is a 70 y.o. female is here for Abdominal Pain (GERD?)   Subjective: HPI:  Over the past 2 weeks she's noticed worsening gastric discomfort. If she takes a Tums it'll go away within 15 minutes but usually comes back within a few hours. She tells me feels like her past episodes of gastric reflux. It's worsened by onions, nuts, chocolate. No radiation of pain. She's not been taking her Protonix. She denies any nausea or unintentional weight loss. No diarrhea or constipation.  She is requesting refills on amlodipine and lisinopril. Outside blood pressures report no chest pain shortness of breath.  She's requesting a refill on Symbicort she wants know we have a sample she can try. It's been helping greatly with keeping her from expense and cough wheezing or shortness of breath.   Review Of Systems Outlined In HPI  Past Medical History:  Diagnosis Date  . Anxiety 06/24/2012  . COPD (chronic obstructive pulmonary disease) ([place])   . Emphysema 06/24/2012  . Emphysema of lung ([place])   . Essential hypertension 06/24/2012  . History of Left leg claudication 06/24/2012  . Hypertension   . Shortness of breath dyspnea     Past Surgical History:  Procedure Laterality Date  . ABDOMINAL AORTAGRAM  12/09/12  . ABDOMINAL AORTAGRAM N/A 12/09/2012   Procedure: ABDOMINAL Maxcine Ham;  Surgeon: Angelia Mould, MD;  Location: [facility] CATH LAB;  Service: Cardiovascular;  Laterality: N/A;   Family History  Problem Relation Age of Onset  . Cancer Mother   . Heart attack Father   . Deep vein thrombosis Father   . Hyperlipidemia Father   . Hypertension Father     Social History   Social History  . Marital status: Married    Spouse name: N/A  . Number of children: N/A  . Years of education: N/A   Occupational History  . Not on file.   Social History Main Topics  . Smoking status: Former Smoker    Packs/day: 1.50    Years: 30.00    Types: Cigarettes    Quit date: 12/30/2009  .  Smokeless tobacco: Never Used  . Alcohol use No  . Drug use: No  . Sexual activity: Not on file   Other Topics Concern  . Not on file   Social History Narrative  . No narrative on file     Objective: BP (!) 145/82 (BP Location: Right Arm, Patient Position: Sitting)   Pulse 71   Wt 112 lb (50.8 kg)   BMI 20.49 kg/m   General: Alert and Oriented, No Acute Distress HEENT: Pupils equal, round, reactive to light. Conjunctivae clear.  Moist mucous membranes Lungs: Clear to auscultation bilaterally, no wheezing/ronchi/rales.  Comfortable work of breathing. Good air movement. Cardiac: Regular rate and rhythm. Normal S1/S2.  No murmurs, rubs, nor gallops.   Abdomen: Soft nontender no rebound tenderness Extremities: No peripheral edema.  Strong peripheral pulses.  Mental Status: No depression, anxiety, nor agitation. Skin: Warm and dry.  Assessment & Plan: Arisbeth was seen today for abdominal pain.  Diagnoses and all orders for this visit:  Pulmonary emphysema, unspecified emphysema type ([place])  Gastroesophageal reflux disease, esophagitis presence not specified -     pantoprazole (PROTONIX) 40 MG tablet; Take 2 tablets (80 mg total) by mouth daily.  Essential hypertension -     lisinopril (PRINIVIL,ZESTRIL) 10 MG tablet; Take 1 tablet (10 mg total) by mouth daily.  Other orders -     amLODipine (NORVASC) 10 MG tablet;  Take 1 tablet (10 mg total) by mouth daily. -     budesonide-formoterol (SYMBICORT) 160-4.5 MCG/ACT inhaler; Inhale 2 puffs into the lungs 2 (two) times daily.   GERD: Uncontrolled chronic condition, restart Protonix at a dose of twice a day for the first month then daily for at least 2 more months. Essential hypertension: Controlled with lisinopril and amlodipine Emphysema: Controlled with Symbicort.   Return in about 3 months (around 07/13/2016).

## 2016-04-25 ENCOUNTER — Ambulatory Visit (INDEPENDENT_AMBULATORY_CARE_PROVIDER_SITE_OTHER): Payer: 59 | Admitting: Family Medicine

## 2016-04-25 ENCOUNTER — Encounter: Payer: Self-pay | Admitting: Family Medicine

## 2016-04-25 VITALS — BP 147/74 | HR 72 | Wt 114.0 lb

## 2016-04-25 DIAGNOSIS — R1013 Epigastric pain: Secondary | ICD-10-CM | POA: Insufficient documentation

## 2016-04-25 LAB — COMPLETE METABOLIC PANEL WITH GFR
ALBUMIN: 4.3 g/dL (ref 3.6–5.1)
ALK PHOS: 42 U/L (ref 33–130)
ALT: 7 U/L (ref 6–29)
AST: 15 U/L (ref 10–35)
BUN: 27 mg/dL — AB (ref 7–25)
CHLORIDE: 106 mmol/L (ref 98–110)
CO2: 24 mmol/L (ref 20–31)
Calcium: 9.5 mg/dL (ref 8.6–10.4)
Creat: 1.46 mg/dL — ABNORMAL HIGH (ref 0.50–0.99)
GFR, EST NON AFRICAN AMERICAN: 36 mL/min — AB (ref >=60)
GFR, Est African American: 42 mL/min — ABNORMAL LOW (ref >=60)
GLUCOSE: 79 mg/dL (ref 65–99)
POTASSIUM: 4.9 mmol/L (ref 3.5–5.3)
SODIUM: 141 mmol/L (ref 135–146)
Total Bilirubin: 0.5 mg/dL (ref 0.2–1.2)
Total Protein: 6.6 g/dL (ref 6.1–8.1)

## 2016-04-25 LAB — CBC
HCT: 44 % (ref 35.0–45.0)
Hemoglobin: 14.5 g/dL (ref 11.7–15.5)
MCH: 30.7 pg (ref 27.0–33.0)
MCHC: 33 g/dL (ref 32.0–36.0)
MCV: 93 fL (ref 80.0–100.0)
MPV: 11.6 fL (ref 7.5–12.5)
PLATELETS: 295 10*3/uL (ref 140–400)
RBC: 4.73 MIL/uL (ref 3.80–5.10)
RDW: 14.1 % (ref 11.0–15.0)
WBC: 10.6 10*3/uL (ref 3.8–10.8)

## 2016-04-25 LAB — GAMMA GT: GGT: 13 U/L (ref 7–51)

## 2016-04-25 LAB — LIPASE: LIPASE: 38 U/L (ref 7–60)

## 2016-04-25 NOTE — Progress Notes (Signed)
CC: Catherine Phillips is a 70 y.o. female is here for GI Problem   Subjective: HPI:  Continued occasional epigastric discomfort. She tells me that she doesn't believe that this appointment is necessary however she is here because her husband is still concerned about her epigastric discomfort. After being on Protonix twice a day for a few days she noticed a significant improvement and reduction of her epigastric discomfort. She's been able to manage this also by reducing foods like nuts and chocolate. She denies any nausea, decreased appetite or change in bowel movements. She tells me that occasionally she'll get pain in her stomach if she is under stress at work however at most is 1 out of 10 in the pain scale. Denies any fevers, chills, or cough. No chest discomfort    Review Of Systems Outlined In HPI  Past Medical History:  Diagnosis Date  . Anxiety 06/24/2012  . COPD (chronic obstructive pulmonary disease) ([place])   . Emphysema 06/24/2012  . Emphysema of lung ([place])   . Essential hypertension 06/24/2012  . History of Left leg claudication 06/24/2012  . Hypertension   . Shortness of breath dyspnea     Past Surgical History:  Procedure Laterality Date  . ABDOMINAL AORTAGRAM  12/09/12  . ABDOMINAL AORTAGRAM N/A 12/09/2012   Procedure: ABDOMINAL Maxcine Ham;  Surgeon: Angelia Mould, MD;  Location: [facility] CATH LAB;  Service: Cardiovascular;  Laterality: N/A;   Family History  Problem Relation Age of Onset  . Cancer Mother   . Heart attack Father   . Deep vein thrombosis Father   . Hyperlipidemia Father   . Hypertension Father     Social History   Social History  . Marital status: Married    Spouse name: N/A  . Number of children: N/A  . Years of education: N/A   Occupational History  . Not on file.   Social History Main Topics  . Smoking status: Former Smoker    Packs/day: 1.50    Years: 30.00    Types: Cigarettes    Quit date: 12/30/2009  . Smokeless tobacco: Never Used  .  Alcohol use No  . Drug use: No  . Sexual activity: Not on file   Other Topics Concern  . Not on file   Social History Narrative  . No narrative on file     Objective: BP (!) 147/74   Pulse 72   Wt 114 lb (51.7 kg)   BMI 20.85 kg/m   Vital signs reviewed. General: Alert and Oriented, No Acute Distress HEENT: Pupils equal, round, reactive to light. Conjunctivae clear.  External ears unremarkable.  Moist mucous membranes. Lungs: Clear and comfortable work of breathing, speaking in full sentences without accessory muscle use. Cardiac: Regular rate and rhythm.  Neuro: CN II-XII grossly intact, gait normal. Abdomen: Soft nontender without rebound tenderness Extremities: No peripheral edema.  Strong peripheral pulses.  Mental Status: No depression, anxiety, nor agitation. Logical though process. Skin: Warm and dry.  Assessment & Plan: Catherine Phillips was seen today for gi problem.  Diagnoses and all orders for this visit:  Epigastric abdominal pain -     Gamma GT -     CBC -     Lipase -     COMPLETE METABOLIC PANEL WITH GFR   Epigastric discomfort: Overall sounds like she is improving, to help address her husband's concerns we'll rule out hepatitis, pancreatitis or cholecystitis. Continue Protonix pending the above results  Return if symptoms worsen or fail to improve.

## 2016-05-16 ENCOUNTER — Encounter: Payer: Self-pay | Admitting: Family Medicine

## 2016-05-16 ENCOUNTER — Ambulatory Visit (INDEPENDENT_AMBULATORY_CARE_PROVIDER_SITE_OTHER): Payer: Medicare Other | Admitting: Family Medicine

## 2016-05-16 VITALS — BP 158/63 | HR 79 | Wt 115.0 lb

## 2016-05-16 DIAGNOSIS — R300 Dysuria: Secondary | ICD-10-CM

## 2016-05-16 LAB — POCT URINALYSIS DIPSTICK
Bilirubin, UA: NEGATIVE
Glucose, UA: NEGATIVE
Ketones, UA: NEGATIVE
NITRITE UA: NEGATIVE
PH UA: 5.5
Protein, UA: NEGATIVE
SPEC GRAV UA: 1.015
UROBILINOGEN UA: 0.2

## 2016-05-16 MED ORDER — ALBUTEROL SULFATE HFA 108 (90 BASE) MCG/ACT IN AERS: 2.0000 | INHALATION_SPRAY | RESPIRATORY_TRACT | 11 refills | Status: DC | PRN

## 2016-05-16 MED ORDER — SULFAMETHOXAZOLE-TRIMETHOPRIM 800-160 MG PO TABS: 1.0000 | ORAL_TABLET | Freq: Two times a day (BID) | ORAL | 0 refills | Status: DC

## 2016-05-16 NOTE — Progress Notes (Signed)
CC: Catherine Phillips is a 70 y.o. female is here for Dysuria   Subjective: HPI:  Dysuria, urinary frequency, all present last 4 days slowly worsening despite use of Azo. No other interventions as of yet. Symptoms are moderate in severity and frequently waking her up at night. She denies any flank pain, nausea, vomiting, fevers, chills or gastrointestinal complaints   Review Of Systems Outlined In HPI  Past Medical History:  Diagnosis Date  . Anxiety 06/24/2012  . COPD (chronic obstructive pulmonary disease) ([place])   . Emphysema 06/24/2012  . Emphysema of lung ([place])   . Essential hypertension 06/24/2012  . History of Left leg claudication 06/24/2012  . Hypertension   . Shortness of breath dyspnea     Past Surgical History:  Procedure Laterality Date  . ABDOMINAL AORTAGRAM  12/09/12  . ABDOMINAL AORTAGRAM N/A 12/09/2012   Procedure: ABDOMINAL Maxcine Ham;  Surgeon: Angelia Mould, MD;  Location: [facility] CATH LAB;  Service: Cardiovascular;  Laterality: N/A;   Family History  Problem Relation Age of Onset  . Cancer Mother   . Heart attack Father   . Deep vein thrombosis Father   . Hyperlipidemia Father   . Hypertension Father     Social History   Social History  . Marital status: Married    Spouse name: N/A  . Number of children: N/A  . Years of education: N/A   Occupational History  . Not on file.   Social History Main Topics  . Smoking status: Former Smoker    Packs/day: 1.50    Years: 30.00    Types: Cigarettes    Quit date: 12/30/2009  . Smokeless tobacco: Never Used  . Alcohol use No  . Drug use: No  . Sexual activity: Not on file   Other Topics Concern  . Not on file   Social History Narrative  . No narrative on file     Objective: BP (!) 158/63   Pulse 79   Wt 115 lb (52.2 kg)   BMI 21.03 kg/m   Vital signs reviewed. General: Alert and Oriented, No Acute Distress HEENT: Pupils equal, round, reactive to light. Conjunctivae clear.  External ears  unremarkable.  Moist mucous membranes. Lungs: Clear and comfortable work of breathing, speaking in full sentences without accessory muscle use. Cardiac: Regular rate and rhythm.  Neuro: CN II-XII grossly intact, gait normal. Extremities: No peripheral edema.  Strong peripheral pulses.  Mental Status: No depression, anxiety, nor agitation. Logical though process. Skin: Warm and dry.  Assessment & Plan: Currie was seen today for dysuria.  Diagnoses and all orders for this visit:  Dysuria -     POCT urinalysis dipstick -     albuterol (PROVENTIL HFA;VENTOLIN HFA) 108 (90 Base) MCG/ACT inhaler; Inhale 2 puffs into the lungs every 4 (four) hours as needed for wheezing or shortness of breath. -     sulfamethoxazole-trimethoprim (BACTRIM DS,SEPTRA DS) 800-160 MG tablet; Take 1 tablet by mouth 2 (two) times daily.   Her story and urinalysis are both suspicious for UTI therefore starting Bactrim pending culture.  Return if symptoms worsen or fail to improve.

## 2016-05-19 LAB — URINE CULTURE

## 2016-05-22 ENCOUNTER — Inpatient Hospital Stay (HOSPITAL_BASED_OUTPATIENT_CLINIC_OR_DEPARTMENT_OTHER)
Admission: EM | Admit: 2016-05-22 | Discharge: 2016-05-24 | DRG: 683 | Disposition: A | Discharge status: Home / Self Care | Payer: Medicare Other | Attending: Internal Medicine | Admitting: Internal Medicine

## 2016-05-22 ENCOUNTER — Encounter (HOSPITAL_BASED_OUTPATIENT_CLINIC_OR_DEPARTMENT_OTHER): Payer: Self-pay | Admitting: Emergency Medicine

## 2016-05-22 DIAGNOSIS — N179 Acute kidney failure, unspecified: Principal | ICD-10-CM | POA: Diagnosis present

## 2016-05-22 DIAGNOSIS — I129 Hypertensive chronic kidney disease with stage 1 through stage 4 chronic kidney disease, or unspecified chronic kidney disease: Secondary | ICD-10-CM | POA: Diagnosis present

## 2016-05-22 DIAGNOSIS — E785 Hyperlipidemia, unspecified: Secondary | ICD-10-CM | POA: Diagnosis present

## 2016-05-22 DIAGNOSIS — M810 Age-related osteoporosis without current pathological fracture: Secondary | ICD-10-CM | POA: Diagnosis present

## 2016-05-22 DIAGNOSIS — Z881 Allergy status to other antibiotic agents status: Secondary | ICD-10-CM | POA: Diagnosis not present

## 2016-05-22 DIAGNOSIS — E875 Hyperkalemia: Secondary | ICD-10-CM | POA: Diagnosis not present

## 2016-05-22 DIAGNOSIS — Z885 Allergy status to narcotic agent status: Secondary | ICD-10-CM

## 2016-05-22 DIAGNOSIS — E871 Hypo-osmolality and hyponatremia: Secondary | ICD-10-CM | POA: Diagnosis present

## 2016-05-22 DIAGNOSIS — I10 Essential (primary) hypertension: Secondary | ICD-10-CM

## 2016-05-22 DIAGNOSIS — G629 Polyneuropathy, unspecified: Secondary | ICD-10-CM | POA: Diagnosis present

## 2016-05-22 DIAGNOSIS — Z79899 Other long term (current) drug therapy: Secondary | ICD-10-CM | POA: Diagnosis not present

## 2016-05-22 DIAGNOSIS — Z9981 Dependence on supplemental oxygen: Secondary | ICD-10-CM

## 2016-05-22 DIAGNOSIS — I70209 Unspecified atherosclerosis of native arteries of extremities, unspecified extremity: Secondary | ICD-10-CM | POA: Diagnosis present

## 2016-05-22 DIAGNOSIS — J410 Simple chronic bronchitis: Secondary | ICD-10-CM | POA: Diagnosis not present

## 2016-05-22 DIAGNOSIS — J449 Chronic obstructive pulmonary disease, unspecified: Secondary | ICD-10-CM | POA: Diagnosis present

## 2016-05-22 DIAGNOSIS — Z87891 Personal history of nicotine dependence: Secondary | ICD-10-CM

## 2016-05-22 DIAGNOSIS — K219 Gastro-esophageal reflux disease without esophagitis: Secondary | ICD-10-CM | POA: Diagnosis present

## 2016-05-22 DIAGNOSIS — Z7951 Long term (current) use of inhaled steroids: Secondary | ICD-10-CM | POA: Diagnosis not present

## 2016-05-22 DIAGNOSIS — F419 Anxiety disorder, unspecified: Secondary | ICD-10-CM | POA: Diagnosis present

## 2016-05-22 DIAGNOSIS — N183 Chronic kidney disease, stage 3 (moderate): Secondary | ICD-10-CM | POA: Diagnosis present

## 2016-05-22 DIAGNOSIS — Z791 Long term (current) use of non-steroidal anti-inflammatories (NSAID): Secondary | ICD-10-CM

## 2016-05-22 DIAGNOSIS — Z8249 Family history of ischemic heart disease and other diseases of the circulatory system: Secondary | ICD-10-CM

## 2016-05-22 DIAGNOSIS — R911 Solitary pulmonary nodule: Secondary | ICD-10-CM | POA: Diagnosis present

## 2016-05-22 HISTORY — DX: Diverticulosis of intestine, part unspecified, without perforation or abscess without bleeding: K57.90

## 2016-05-22 LAB — COMPREHENSIVE METABOLIC PANEL
ALBUMIN: 4.6 g/dL (ref 3.5–5.0)
ALK PHOS: 49 U/L (ref 38–126)
ALT: 13 U/L — AB (ref 14–54)
ANION GAP: 9 (ref 5–15)
AST: 22 U/L (ref 15–41)
BUN: 36 mg/dL — ABNORMAL HIGH (ref 6–20)
CALCIUM: 9.9 mg/dL (ref 8.9–10.3)
CO2: 19 mmol/L — AB (ref 22–32)
Chloride: 107 mmol/L (ref 101–111)
Creatinine, Ser: 2.49 mg/dL — ABNORMAL HIGH (ref 0.44–1.00)
GFR calc Af Amer: 22 mL/min — ABNORMAL LOW (ref >60)
GFR calc non Af Amer: 19 mL/min — ABNORMAL LOW (ref >60)
GLUCOSE: 72 mg/dL (ref 65–99)
Potassium: 7.3 mmol/L (ref 3.5–5.1)
SODIUM: 135 mmol/L (ref 135–145)
Total Bilirubin: 0.6 mg/dL (ref 0.3–1.2)
Total Protein: 7.8 g/dL (ref 6.5–8.1)

## 2016-05-22 LAB — CBG MONITORING, ED
GLUCOSE-CAPILLARY: 116 mg/dL — AB (ref 65–99)
Glucose-Capillary: 237 mg/dL — ABNORMAL HIGH (ref 65–99)

## 2016-05-22 LAB — CBC WITH DIFFERENTIAL/PLATELET
BASOS ABS: 0.1 10*3/uL (ref 0.0–0.1)
BASOS PCT: 1 %
EOS ABS: 2.3 10*3/uL — AB (ref 0.0–0.7)
Eosinophils Relative: 19 %
HCT: 46.6 % — ABNORMAL HIGH (ref 36.0–46.0)
HEMOGLOBIN: 14.9 g/dL (ref 12.0–15.0)
Lymphocytes Relative: 20 %
Lymphs Abs: 2.5 10*3/uL (ref 0.7–4.0)
MCH: 31 pg (ref 26.0–34.0)
MCHC: 32 g/dL (ref 30.0–36.0)
MCV: 96.9 fL (ref 78.0–100.0)
Monocytes Absolute: 0.8 10*3/uL (ref 0.1–1.0)
Monocytes Relative: 6 %
NEUTROS PCT: 54 %
Neutro Abs: 6.5 10*3/uL (ref 1.7–7.7)
Platelets: 313 10*3/uL (ref 150–400)
RBC: 4.81 MIL/uL (ref 3.87–5.11)
RDW: 14.7 % (ref 11.5–15.5)
WBC: 12.1 10*3/uL — AB (ref 4.0–10.5)

## 2016-05-22 LAB — BASIC METABOLIC PANEL
ANION GAP: 3 — AB (ref 5–15)
BUN: 29 mg/dL — ABNORMAL HIGH (ref 6–20)
CALCIUM: 8.8 mg/dL — AB (ref 8.9–10.3)
CO2: 15 mmol/L — ABNORMAL LOW (ref 22–32)
Chloride: 116 mmol/L — ABNORMAL HIGH (ref 101–111)
Creatinine, Ser: 2.3 mg/dL — ABNORMAL HIGH (ref 0.44–1.00)
GFR, EST AFRICAN AMERICAN: 24 mL/min — AB (ref >60)
GFR, EST NON AFRICAN AMERICAN: 21 mL/min — AB (ref >60)
GLUCOSE: 148 mg/dL — AB (ref 65–99)
POTASSIUM: 6.5 mmol/L — AB (ref 3.5–5.1)
Sodium: 134 mmol/L — ABNORMAL LOW (ref 135–145)

## 2016-05-22 LAB — POTASSIUM: POTASSIUM: 6.9 mmol/L — AB (ref 3.5–5.1)

## 2016-05-22 LAB — URINALYSIS, ROUTINE W REFLEX MICROSCOPIC
BILIRUBIN URINE: NEGATIVE
GLUCOSE, UA: NEGATIVE mg/dL
HGB URINE DIPSTICK: NEGATIVE
Ketones, ur: 15 mg/dL — AB
Leukocytes, UA: NEGATIVE
Nitrite: NEGATIVE
PROTEIN: NEGATIVE mg/dL
SPECIFIC GRAVITY, URINE: 1.012 (ref 1.005–1.030)
pH: 5.5 (ref 5.0–8.0)

## 2016-05-22 LAB — LIPASE, BLOOD: Lipase: 30 U/L (ref 11–51)

## 2016-05-22 MED ORDER — SODIUM CHLORIDE 0.9 % IV SOLN
INTRAVENOUS | Status: DC
  Administered 2016-05-23 (×2): via INTRAVENOUS

## 2016-05-22 MED ORDER — CALCIUM GLUCONATE 10 % IV SOLN
1.0000 g | Freq: Once | INTRAVENOUS | Status: AC
  Administered 2016-05-22: 1 g via INTRAVENOUS
  Filled 2016-05-22: qty 10

## 2016-05-22 MED ORDER — SODIUM BICARBONATE 8.4 % IV SOLN
50.0000 meq | Freq: Once | INTRAVENOUS | Status: AC
  Administered 2016-05-22: 50 meq via INTRAVENOUS
  Filled 2016-05-22: qty 50

## 2016-05-22 MED ORDER — ONDANSETRON HCL 4 MG PO TABS: 4.0000 mg | ORAL_TABLET | Freq: Four times a day (QID) | ORAL | Status: DC | PRN

## 2016-05-22 MED ORDER — SODIUM CHLORIDE 0.9 % IV BOLUS (SEPSIS)
1000.0000 mL | Freq: Once | INTRAVENOUS | Status: AC
  Administered 2016-05-22: 1000 mL via INTRAVENOUS

## 2016-05-22 MED ORDER — ONDANSETRON HCL 4 MG/2ML IJ SOLN
4.0000 mg | Freq: Once | INTRAMUSCULAR | Status: AC
  Administered 2016-05-22: 4 mg via INTRAVENOUS
  Filled 2016-05-22: qty 2

## 2016-05-22 MED ORDER — AMLODIPINE BESYLATE 10 MG PO TABS
10.0000 mg | ORAL_TABLET | Freq: Every day | ORAL | Status: DC
  Administered 2016-05-23 – 2016-05-24 (×2): 10 mg via ORAL
  Filled 2016-05-22 (×2): qty 1

## 2016-05-22 MED ORDER — SODIUM CHLORIDE 0.9 % IV SOLN
INTRAVENOUS | Status: DC
  Administered 2016-05-22: 11:00:00 via INTRAVENOUS

## 2016-05-22 MED ORDER — PANTOPRAZOLE SODIUM 40 MG PO TBEC
40.0000 mg | DELAYED_RELEASE_TABLET | Freq: Two times a day (BID) | ORAL | Status: DC
  Administered 2016-05-23 – 2016-05-24 (×3): 40 mg via ORAL
  Filled 2016-05-22 (×3): qty 1

## 2016-05-22 MED ORDER — INSULIN REGULAR HUMAN 100 UNIT/ML IJ SOLN
10.0000 [IU] | Freq: Once | INTRAMUSCULAR | Status: AC
  Administered 2016-05-22: 10 [IU] via INTRAVENOUS
  Filled 2016-05-22: qty 1

## 2016-05-22 MED ORDER — DEXTROSE 50 % IV SOLN
1.0000 | Freq: Once | INTRAVENOUS | Status: AC
  Administered 2016-05-22: 50 mL via INTRAVENOUS
  Filled 2016-05-22: qty 50

## 2016-05-22 MED ORDER — HEPARIN SODIUM (PORCINE) 5000 UNIT/ML IJ SOLN
5000.0000 [IU] | Freq: Three times a day (TID) | INTRAMUSCULAR | Status: DC
  Administered 2016-05-22 – 2016-05-24 (×6): 5000 [IU] via SUBCUTANEOUS
  Filled 2016-05-22 (×6): qty 1

## 2016-05-22 MED ORDER — ALBUTEROL SULFATE (2.5 MG/3ML) 0.083% IN NEBU: 3.0000 mL | INHALATION_SOLUTION | RESPIRATORY_TRACT | Status: DC | PRN

## 2016-05-22 MED ORDER — POLYETHYLENE GLYCOL 3350 17 G PO PACK: 17.0000 g | PACK | Freq: Every day | ORAL | Status: DC | PRN

## 2016-05-22 MED ORDER — TRAZODONE HCL 50 MG PO TABS
25.0000 mg | ORAL_TABLET | Freq: Every evening | ORAL | Status: DC | PRN
Start: 2016-05-22 — End: 2016-05-24
  Administered 2016-05-23: 25 mg via ORAL
  Filled 2016-05-22: qty 1

## 2016-05-22 MED ORDER — SODIUM POLYSTYRENE SULFONATE 15 GM/60ML PO SUSP
30.0000 g | Freq: Once | ORAL | Status: AC
  Administered 2016-05-22: 30 g via ORAL
  Filled 2016-05-22: qty 120

## 2016-05-22 MED ORDER — AMLODIPINE BESYLATE 10 MG PO TABS
10.0000 mg | ORAL_TABLET | Freq: Every day | ORAL | Status: DC
Start: 2016-05-22 — End: 2016-05-22

## 2016-05-22 MED ORDER — BISACODYL 5 MG PO TBEC: 5.0000 mg | DELAYED_RELEASE_TABLET | Freq: Every day | ORAL | Status: DC | PRN

## 2016-05-22 MED ORDER — GUAIFENESIN ER 600 MG PO TB12
600.0000 mg | ORAL_TABLET | Freq: Two times a day (BID) | ORAL | Status: DC
  Administered 2016-05-22 – 2016-05-24 (×4): 600 mg via ORAL
  Filled 2016-05-22 (×4): qty 1

## 2016-05-22 MED ORDER — SODIUM CHLORIDE 0.9 % IV SOLN
Freq: Once | INTRAVENOUS | Status: AC
  Administered 2016-05-22: 10:00:00 via INTRAVENOUS

## 2016-05-22 MED ORDER — MOMETASONE FURO-FORMOTEROL FUM 200-5 MCG/ACT IN AERO
2.0000 | INHALATION_SPRAY | Freq: Two times a day (BID) | RESPIRATORY_TRACT | Status: DC | PRN
  Filled 2016-05-22: qty 8.8

## 2016-05-22 MED ORDER — ACETAMINOPHEN 325 MG PO TABS
650.0000 mg | ORAL_TABLET | Freq: Four times a day (QID) | ORAL | Status: DC | PRN
  Administered 2016-05-22 – 2016-05-23 (×4): 650 mg via ORAL
  Filled 2016-05-22 (×4): qty 2

## 2016-05-22 MED ORDER — PREGABALIN 50 MG PO CAPS
50.0000 mg | ORAL_CAPSULE | Freq: Three times a day (TID) | ORAL | Status: DC
  Administered 2016-05-22 – 2016-05-24 (×6): 50 mg via ORAL
  Filled 2016-05-22 (×6): qty 1

## 2016-05-22 MED ORDER — ACETAMINOPHEN 650 MG RE SUPP: 650.0000 mg | Freq: Four times a day (QID) | RECTAL | Status: DC | PRN

## 2016-05-22 MED ORDER — ALBUTEROL SULFATE (2.5 MG/3ML) 0.083% IN NEBU
5.0000 mg | INHALATION_SOLUTION | Freq: Once | RESPIRATORY_TRACT | Status: AC
  Administered 2016-05-22: 5 mg via RESPIRATORY_TRACT
  Filled 2016-05-22: qty 6

## 2016-05-22 MED ORDER — ONDANSETRON HCL 4 MG/2ML IJ SOLN: 4.0000 mg | Freq: Four times a day (QID) | INTRAMUSCULAR | Status: DC | PRN

## 2016-05-22 NOTE — ED Notes (Signed)
Patient ambulatory to bathroom with staff assist x 1 without difficulty.  Patient states she cannot provide urine sample at present.

## 2016-05-22 NOTE — H&P (Signed)
History and Physical    [name] [id number] DOB: [birth year] DOA: 05/22/2016  PCP: Iran Planas, PA-C  Patient coming from:   Home   Chief Complaint:  Abdominal pain / leg pain and weakness  HPI: Catherine Phillips is a 70 y.o. female with a medical history significant for, but not  limited to, COPD and HTN.  Started antibiotics for UTI ( bactrim?) on Tuesday. On Thursday developed bilateral lower leg cramping. That evening she developed diffuse abdominal discomfort. Saturday night she developed diarrhea. Patient completed five days of Bactrim on Saturday. No rashes or arthralgia. No vomiting. No other new meds / med changes. Patient has never taken Bactrim before.   ED Course:  Transferred from [place]. Given Ca+ gluconate, insulin / D50 and IVF.  VSS  Review of Systems: As per HPI, otherwise 10 point review of systems negative.    Past Medical History:  Diagnosis Date  . Anxiety 06/24/2012  . COPD (chronic obstructive pulmonary disease) ([place])   . Diverticulosis   . Emphysema of lung ([place])   . Essential hypertension 06/24/2012  . History of Left leg claudication 06/24/2012  . Hypertension     Past Surgical History:  Procedure Laterality Date  . ABDOMINAL AORTAGRAM  12/09/12  . ABDOMINAL AORTAGRAM N/A 12/09/2012   Procedure: ABDOMINAL Maxcine Ham;  Surgeon: Angelia Mould, MD;  Location: [facility] CATH LAB;  Service: Cardiovascular;  Laterality: N/A;    Social History   Social History  . Marital status: Married    Spouse name: N/A  . Number of children: N/A  . Years of education: N/A   Occupational History  . Not on file.   Social History Main Topics  . Smoking status: Former Smoker    Packs/day: 1.50    Years: 30.00    Types: Cigarettes    Quit date: 12/30/2009  . Smokeless tobacco: Never Used  . Alcohol use No  . Drug use: No  . Sexual activity: Not on file   Other Topics Concern  . Not on file   Social History Narrative  . No narrative on  file   Lives at home with husband. No assistive devices needed for ambulation.  Allergies  Allergen Reactions  . Codeine Nausea And Vomiting  . Levaquin [Levofloxacin In D5w] Itching and Rash    Family History  Problem Relation Age of Onset  . Cancer Mother   . Heart attack Father   . Deep vein thrombosis Father   . Hyperlipidemia Father   . Hypertension Father     Prior to Admission medications   Medication Sig Start Date End Date Taking? Authorizing Provider  albuterol (PROVENTIL HFA;VENTOLIN HFA) 108 (90 Base) MCG/ACT inhaler Inhale 2 puffs into the lungs every 4 (four) hours as needed for wheezing or shortness of breath. 05/16/16  Yes Sean Hommel, DO  albuterol (PROVENTIL) (2.5 MG/3ML) 0.083% nebulizer solution Take 2.5 mg by nebulization every 6 (six) hours as needed for wheezing or shortness of breath.   Yes Historical Provider, MD  amLODipine (NORVASC) 10 MG tablet Take 1 tablet (10 mg total) by mouth daily. 04/12/16  Yes Sean Hommel, DO  budesonide-formoterol (SYMBICORT) 160-4.5 MCG/ACT inhaler Inhale 2 puffs into the lungs 2 (two) times daily. Patient taking differently: Inhale 2 puffs into the lungs 2 (two) times daily as needed (shortness of breath/ wheezing).  04/12/16  Yes Sean Hommel, DO  celecoxib (CELEBREX) 100 MG capsule Take 1 capsule (100 mg total) by mouth 2 (two) times daily as  needed (arthritis). 01/18/15  Yes Sean Hommel, DO  ibuprofen (ADVIL,MOTRIN) 200 MG tablet Take 400 mg by mouth 2 (two) times daily as needed for headache.   Yes Historical Provider, MD  lisinopril (PRINIVIL,ZESTRIL) 10 MG tablet Take 1 tablet (10 mg total) by mouth daily. 04/12/16  Yes Sean Hommel, DO  LYRICA 50 MG capsule TAKE ONE CAPSULE 3 TIMES A DAY 08/05/15  Yes Sean Hommel, DO  Omega-3 Fatty Acids (FISH OIL PO) Take 1 capsule by mouth daily.   Yes Historical Provider, MD  pantoprazole (PROTONIX) 40 MG tablet Take 2 tablets (80 mg total) by mouth daily. Patient taking differently: Take 40 mg  by mouth 2 (two) times daily before a meal.  04/12/16  Yes Sean Hommel, DO  Pitavastatin Calcium (LIVALO) 4 MG TABS Take 1 tablet (4 mg total) by mouth every other day. Patient taking differently: Take 4 mg by mouth See admin instructions. Take 1 tablet (4 mg) by mouth every other night 04/20/15  Yes Marcial Pacas, DO    Physical Exam: Vitals:   05/22/16 1028 05/22/16 1035 05/22/16 1100 05/22/16 1218  BP:  (!) 135/51 121/64 (!) 139/34  Pulse:  76 95 87  Resp:  19 18 18   Temp:    98.2 F (36.8 C)  TempSrc:    Oral  SpO2: 94% 100% 92% 91%  Weight:    53 kg (116 lb 12.8 oz)  Height:    5\' 2"  (1.575 m)    Constitutional:  Pleasant white female in NAD, calm, comfortable Vitals:   05/22/16 1028 05/22/16 1035 05/22/16 1100 05/22/16 1218  BP:  (!) 135/51 121/64 (!) 139/34  Pulse:  76 95 87  Resp:  19 18 18   Temp:    98.2 F (36.8 C)  TempSrc:    Oral  SpO2: 94% 100% 92% 91%  Weight:    53 kg (116 lb 12.8 oz)  Height:    5\' 2"  (1.575 m)   Eyes: PER, lids and conjunctivae normal ENMT: Mucous membranes are moist. Posterior pharynx clear of any exudate or lesions..  Neck: normal, supple, no masses Respiratory: Decreased breath sounds at bases, no wheezing, no crackles. Normal respiratory effort. No accessory muscle use.  Cardiovascular: Regular rate and rhythm, no murmurs / rubs / gallops. No extremity edema. 2+ dorsal pedis pulses.   Abdomen: no tenderness, no masses palpated. No hepatomegaly. Bowel sounds positive.  Musculoskeletal: no clubbing / cyanosis. No joint deformity upper and lower extremities. Good ROM, no contractures. Normal muscle tone.  Skin: no rashes, lesions, ulcers.  Neurologic: CN 2-12 grossly intact. Sensation intact, Strength 5/5 in all 4.  Psychiatric: Normal judgment and insight. Alert and oriented x 3. Normal mood.   Labs on Admission: I have personally reviewed following labs and imaging studies   Urine analysis:    Component Value Date/Time   COLORURINE  YELLOW 05/22/2016 1000   APPEARANCEUR CLEAR 05/22/2016 1000   LABSPEC 1.012 05/22/2016 1000   PHURINE 5.5 05/22/2016 1000   GLUCOSEU NEGATIVE 05/22/2016 1000   HGBUR NEGATIVE 05/22/2016 1000   BILIRUBINUR NEGATIVE 05/22/2016 1000   BILIRUBINUR negative 05/16/2016 1434   KETONESUR 15 (A) 05/22/2016 1000   PROTEINUR NEGATIVE 05/22/2016 1000   UROBILINOGEN 0.2 05/16/2016 1434   UROBILINOGEN 0.2 06/26/2015 1700   NITRITE NEGATIVE 05/22/2016 1000   LEUKOCYTESUR NEGATIVE 05/22/2016 1000    Radiological Exams on Admission: No results found.  EKG: Independently reviewed.   EKG Interpretation  Date/Time:  Monday May 22 2016 10:08:14 EDT  Ventricular Rate:  82 PR Interval:    QRS Duration: 94 QT Interval:  362 QTC Calculation: 423 R Axis:   76 Text Interpretation:  Sinus rhythm Probable anterior infarct, old ST elevation, consider inferior injury diffuse peaked t-waves Confirmed by [facility]  MD, Loree Fee ([id number]) on 05/22/2016 10:23:22 AM     EKG - Personally reviewed by Dr. Marily Memos    Assessment/Plan   Active Problems:   COPD (chronic obstructive pulmonary disease) ([place])   AKI (acute kidney injury) ([place])   Hyperkalemia   AKI / Hyperkalemia following course of bactrim for UTI. Patient has underlying CKD 3. Baseline Cr 1.46, up to 2.49 today..Potassium still elevated at 6.5 despite calcium gluconate, insulin / D50, and IVF.  Renal function slowly improving 2.49 >>> 2.3. -Admit to telemetry. EKG reviewed -hold home Lisinopril, celebrex and NSAIDS -continue maintenance IVF at 105ml / hr -add 30 grams kayexelate now -Repeat BMET around 10pm and in am  -Monitor I+0                         COPD, stable -continue home prn inhalers  Peripheral neuropathy -continue home lyrica.             HTN, controlled -continue Norvasc, holding Lisinopril for now  GERD. . -continue home BID PPI  Asthma -continue home prn inhalers  DVT prophylaxis:     SQ Heparin Code Status:      Full code  Family Communication:    Treatment plan discussed with husband in the room and he understands and agrees with the plan..  Disposition Plan:   Discharge home in 24-48 hours              Consults called:  None  Admission status:   Admission -  Telemetry     Tye Savoy NP [facility] Pager [phone]  If 7PM-7AM, please contact night-coverage [email or web address] Password [email or web address]  ,

## 2016-05-22 NOTE — Progress Notes (Signed)
Patient coming from [facility] for admission to telemetry floor due to severe symptomatic hyperkalemia and acute kidney injury. Of note patient with history of recent treatment for UTI with Bactrim, possible etiology of symptoms. Patient has received multiple fluid boluses, calcium gluconate, D50, insulin with some improvement in potassium.  Linna Darner, MD [facility] 05/22/2016, 10:56 AM

## 2016-05-22 NOTE — ED Notes (Signed)
Patient oxygen saturation 83% on RA-placed on 2L via [place]

## 2016-05-22 NOTE — ED Triage Notes (Addendum)
Patient reports diarrhea which began yesterday.  Reports she began taking antibiotics for UTI last Tuesday.  Reports lower back pain as well.  Denies dysuria.  Reports LLQ abdominal pain.

## 2016-05-22 NOTE — ED Notes (Signed)
MD at bedside. 

## 2016-05-22 NOTE — ED Provider Notes (Signed)
[facility] DEPT [facility] Provider Note   CSN: [id number] Arrival date & time: 05/22/16  [id number]     History   Chief Complaint Chief Complaint  Patient presents with  . Diarrhea    HPI Catherine Phillips is a 69 y.o. female.  The history is provided by the patient.  Abdominal Pain   This is a new problem. The current episode started yesterday. The problem occurs constantly. The problem has been gradually improving. The pain is associated with an unknown factor. The pain is located in the LLQ and suprapubic region (radiates into the lower back). The quality of the pain is cramping, shooting and throbbing. The pain is at a severity of 6/10. The pain is moderate. Associated symptoms include anorexia, diarrhea and nausea. Pertinent negatives include fever, vomiting, dysuria, frequency and hematuria. Associated symptoms comments: Chills and cramping in the bilateral calf muscles.  No leg swelling or rashes.  No blood in the stool.  Recently treated for UTI with abx and completed course on same day sx started (sunday).  No current dysuria, frequency or urgency.. Nothing aggravates the symptoms. Nothing relieves the symptoms. Her past medical history does not include gallstones or ulcerative colitis. Past medical history comments: no prior abd surgeries.    Past Medical History:  Diagnosis Date  . Anxiety 06/24/2012  . COPD (chronic obstructive pulmonary disease) ([place])   . Diverticulosis   . Emphysema 06/24/2012  . Emphysema of lung ([place])   . Essential hypertension 06/24/2012  . History of Left leg claudication 06/24/2012  . Hypertension   . Shortness of breath dyspnea     Patient Active Problem List   Diagnosis Date Noted  . Epigastric abdominal pain 04/25/2016  . COPD (chronic obstructive pulmonary disease) ([place]) 05/02/2015  . Leukocytosis 05/02/2015  . SOB (shortness of breath) 05/02/2015  . Dehydration 05/02/2015  . AKI (acute kidney injury) ([place]) 05/02/2015  . Osteoarthritis, hand  04/19/2015  . Gastritis 12/03/2014  . Pulmonary nodules 04/28/2014  . Peripheral neuropathy ([place]) 10/28/2013  . COPD (chronic obstructive pulmonary disease) with emphysema Gold B 08/22/2013  . Peripheral vascular disease ([place]) 01/22/2013  . Pain in limb 12/04/2012  . Atherosclerosis of native arteries of the extremities with intermittent claudication 12/04/2012  . GERD (gastroesophageal reflux disease) 10/04/2012  . Osteoporosis 09/04/2012  . Hyperlipidemia LDL goal <70 06/26/2012  . History of Left leg claudication 06/24/2012  . Pulmonary emphysema ([place]) 06/24/2012  . Essential hypertension 06/24/2012  . Anxiety 06/24/2012    Past Surgical History:  Procedure Laterality Date  . ABDOMINAL AORTAGRAM  12/09/12  . ABDOMINAL AORTAGRAM N/A 12/09/2012   Procedure: ABDOMINAL Maxcine Ham;  Surgeon: Angelia Mould, MD;  Location: [facility] CATH LAB;  Service: Cardiovascular;  Laterality: N/A;    OB History    No data available       Home Medications    Prior to Admission medications   Medication Sig Start Date End Date Taking? Authorizing Provider  albuterol (PROVENTIL HFA;VENTOLIN HFA) 108 (90 Base) MCG/ACT inhaler Inhale 2 puffs into the lungs every 4 (four) hours as needed for wheezing or shortness of breath. 05/16/16   Sean Hommel, DO  amLODipine (NORVASC) 10 MG tablet Take 1 tablet (10 mg total) by mouth daily. 04/12/16   Sean Hommel, DO  budesonide-formoterol (SYMBICORT) 160-4.5 MCG/ACT inhaler Inhale 2 puffs into the lungs 2 (two) times daily. 04/12/16   Marcial Pacas, DO  celecoxib (CELEBREX) 100 MG capsule Take 1 capsule (100 mg total) by mouth 2 (two) times daily as  needed (arthritis). 01/18/15   Sean Hommel, DO  ibuprofen (ADVIL,MOTRIN) 200 MG tablet Take 400 mg by mouth 2 (two) times daily as needed for headache.    Historical Provider, MD  lisinopril (PRINIVIL,ZESTRIL) 10 MG tablet Take 1 tablet (10 mg total) by mouth daily. 04/12/16   Sean Hommel, DO  LYRICA 50 MG capsule TAKE ONE  CAPSULE 3 TIMES A DAY 08/05/15   Marcial Pacas, DO  Omega-3 Fatty Acids (FISH OIL PO) Take 1 tablet by mouth 2 (two) times daily.    Historical Provider, MD  pantoprazole (PROTONIX) 40 MG tablet Take 2 tablets (80 mg total) by mouth daily. 04/12/16   Marcial Pacas, DO  Pitavastatin Calcium (LIVALO) 4 MG TABS Take 1 tablet (4 mg total) by mouth every other day. 04/20/15   Marcial Pacas, DO  sulfamethoxazole-trimethoprim (BACTRIM DS,SEPTRA DS) 800-160 MG tablet Take 1 tablet by mouth 2 (two) times daily. 05/16/16   Marcial Pacas, DO    Family History Family History  Problem Relation Age of Onset  . Cancer Mother   . Heart attack Father   . Deep vein thrombosis Father   . Hyperlipidemia Father   . Hypertension Father     Social History Social History  Substance Use Topics  . Smoking status: Former Smoker    Packs/day: 1.50    Years: 30.00    Types: Cigarettes    Quit date: 12/30/2009  . Smokeless tobacco: Never Used  . Alcohol use No     Allergies   Codeine and Levaquin [levofloxacin in d5w]   Review of Systems Review of Systems  Constitutional: Positive for appetite change and chills. Negative for fever.  Respiratory: Negative for cough and shortness of breath.   Cardiovascular: Negative for chest pain and leg swelling.  Gastrointestinal: Positive for abdominal pain, anorexia, diarrhea and nausea. Negative for vomiting.  Genitourinary: Negative for difficulty urinating, dysuria, flank pain, frequency and hematuria.  Musculoskeletal: Positive for back pain.  Skin: Negative for rash.     Physical Exam Updated Vital Signs BP (!) 141/52 (BP Location: Right Arm)   Pulse 84   Temp 98.9 F (37.2 C) (Oral)   Resp 16   SpO2 90%   Physical Exam  Constitutional: She is oriented to person, place, and time. She appears well-developed and well-nourished. No distress.  HENT:  Head: Normocephalic and atraumatic.  Mouth/Throat: Oropharynx is clear and moist. Mucous membranes are dry.    Eyes: Conjunctivae and EOM are normal. Pupils are equal, round, and reactive to light.  Neck: Normal range of motion. Neck supple.  Cardiovascular: Normal rate, regular rhythm and intact distal pulses.   No murmur heard. Pulmonary/Chest: Effort normal and breath sounds normal. No respiratory distress. She has no wheezes. She has no rales.  Abdominal: Soft. She exhibits no distension. There is tenderness in the suprapubic area and left lower quadrant. There is no rebound, no guarding and no CVA tenderness.  Musculoskeletal: Normal range of motion. She exhibits tenderness. She exhibits no edema.  Mild tenderness with palpation of bilateral calves but no swelling or erythema.  1+ DP pulse bilaterally and <3 sec cap refill in both feet and warm to palpation  Neurological: She is alert and oriented to person, place, and time.  Skin: Skin is warm and dry. No rash noted. No erythema.  Psychiatric: She has a normal mood and affect. Her behavior is normal.  Nursing note and vitals reviewed.    ED Treatments / Results  Labs (all labs ordered are  listed, but only abnormal results are displayed) Labs Reviewed  CBC WITH DIFFERENTIAL/PLATELET - Abnormal; Notable for the following:       Result Value   WBC 12.1 (*)    HCT 46.6 (*)    Eosinophils Absolute 2.3 (*)    All other components within normal limits  COMPREHENSIVE METABOLIC PANEL - Abnormal; Notable for the following:    Potassium 7.3 (*)    CO2 19 (*)    BUN 36 (*)    Creatinine, Ser 2.49 (*)    ALT 13 (*)    GFR calc non Af Amer 19 (*)    GFR calc Af Amer 22 (*)    All other components within normal limits  URINALYSIS, ROUTINE W REFLEX MICROSCOPIC (NOT AT [facility]) - Abnormal; Notable for the following:    Ketones, ur 15 (*)    All other components within normal limits  URINE CULTURE  LIPASE, BLOOD  POTASSIUM    EKG  EKG Interpretation  Date/Time:  Monday May 22 2016 10:08:14 EDT Ventricular Rate:  82 PR Interval:    QRS  Duration: 94 QT Interval:  362 QTC Calculation: 423 R Axis:   76 Text Interpretation:  Sinus rhythm Probable anterior infarct, old ST elevation, consider inferior injury diffuse peaked t-waves Confirmed by [facility]  MD, Loree Fee ([id number]) on 05/22/2016 10:23:22 AM       Radiology No results found.  Procedures Procedures (including critical care time)  Medications Ordered in ED Medications  ondansetron (ZOFRAN) injection 4 mg (not administered)  sodium chloride 0.9 % bolus 1,000 mL (not administered)     Initial Impression / Assessment and Plan / ED Course  I have reviewed the triage vital signs and the nursing notes.  Pertinent labs & imaging results that were available during my care of the patient were reviewed by me and considered in my medical decision making (see chart for details).  Clinical Course   Patient is a 70 year old female presenting today with a one-day history of lower abdominal pain, diarrhea, nausea that started yesterday. Patient with recent diagnosis of UTI and completed antibiotics yesterday with resolution of her dysuria and urinary frequency. She denies any prior abdominal surgeries.  She does work at [employer] so has multiple sick contacts on a daily basis. Chills last night but no documented fever. She denies any urinary symptoms or vaginal symptoms at this time. Denies any alcohol or drug use. No upper respiratory or cardiac complaints. Vital signs within normal limits. Patient does appear dehydrated on exam but 1+ DP pulses in bilateral lower extremities with capillary refill less than 3 seconds and warm to touch. No CVA tenderness. patient has suprapubic and left lower quadrant discomfort. Low suspicion the patient's symptoms today are caused by kidney stones or ruptured AAA. Suspicious for potential pyelonephritis versus diverticulitis versus potential viral illness. Low suspicion for cardiac or respiratory pathology. Patient given IV fluids, Zofran and CBC, CMP,  lipase, UA pending  10:01 AM Mild leukocytosis of 12 but today on CMP pt has K of 7.3 with new ARF with Cr of 2.49 which may be from recent abx (bactrim) vs possible infected renal stone.  EKG to evaluate for changes and K recheck pending.  10:21 AM EKG with diffuse peaked T waves. Patient given calcium gluconate, albuterol, insulin/D50 and continued IV fluids. Repeat potassium 6.9. UA negative for acute findings. We'll admit for further care  CRITICAL CARE Performed by: Blanchie Dessert Total critical care time: 30 minutes Critical care time was exclusive of  separately billable procedures and treating other patients. Critical care was necessary to treat or prevent imminent or life-threatening deterioration. Critical care was time spent personally by me on the following activities: development of treatment plan with patient and/or surrogate as well as nursing, discussions with consultants, evaluation of patient's response to treatment, examination of patient, obtaining history from patient or surrogate, ordering and performing treatments and interventions, ordering and review of laboratory studies, ordering and review of radiographic studies, pulse oximetry and re-evaluation of patient's condition.  Final Clinical Impressions(s) / ED Diagnoses   Final diagnoses:  Hyperkalemia  Acute renal failure, unspecified acute renal failure type [facility])    New Prescriptions New Prescriptions   No medications on file     Blanchie Dessert, MD 05/22/16 1057

## 2016-05-23 ENCOUNTER — Other Ambulatory Visit: Payer: Self-pay

## 2016-05-23 DIAGNOSIS — N179 Acute kidney failure, unspecified: Principal | ICD-10-CM

## 2016-05-23 DIAGNOSIS — E875 Hyperkalemia: Secondary | ICD-10-CM

## 2016-05-23 DIAGNOSIS — J410 Simple chronic bronchitis: Secondary | ICD-10-CM

## 2016-05-23 LAB — CBC
HCT: 40.2 % (ref 36.0–46.0)
HEMOGLOBIN: 11.9 g/dL — AB (ref 12.0–15.0)
MCH: 29.9 pg (ref 26.0–34.0)
MCHC: 29.6 g/dL — AB (ref 30.0–36.0)
MCV: 101 fL — ABNORMAL HIGH (ref 78.0–100.0)
Platelets: 260 10*3/uL (ref 150–400)
RBC: 3.98 MIL/uL (ref 3.87–5.11)
RDW: 14.9 % (ref 11.5–15.5)
WBC: 9.9 10*3/uL (ref 4.0–10.5)

## 2016-05-23 LAB — BASIC METABOLIC PANEL
ANION GAP: 5 (ref 5–15)
BUN: 22 mg/dL — AB (ref 6–20)
CALCIUM: 8.3 mg/dL — AB (ref 8.9–10.3)
CO2: 22 mmol/L (ref 22–32)
Chloride: 114 mmol/L — ABNORMAL HIGH (ref 101–111)
Creatinine, Ser: 2.04 mg/dL — ABNORMAL HIGH (ref 0.44–1.00)
GFR calc Af Amer: 27 mL/min — ABNORMAL LOW (ref >60)
GFR calc non Af Amer: 24 mL/min — ABNORMAL LOW (ref >60)
GLUCOSE: 80 mg/dL (ref 65–99)
Potassium: 6.6 mmol/L (ref 3.5–5.1)
Sodium: 141 mmol/L (ref 135–145)

## 2016-05-23 LAB — URINE CULTURE: Culture: NO GROWTH

## 2016-05-23 LAB — POTASSIUM: POTASSIUM: 5 mmol/L (ref 3.5–5.1)

## 2016-05-23 LAB — GLUCOSE, CAPILLARY
GLUCOSE-CAPILLARY: 68 mg/dL (ref 65–99)
Glucose-Capillary: 74 mg/dL (ref 65–99)

## 2016-05-23 MED ORDER — DEXTROSE 50 % IV SOLN: 1.0000 | Freq: Once | INTRAVENOUS | Status: AC

## 2016-05-23 MED ORDER — SODIUM CHLORIDE 0.9 % IV BOLUS (SEPSIS)
500.0000 mL | Freq: Once | INTRAVENOUS | Status: AC
  Administered 2016-05-23: 500 mL via INTRAVENOUS

## 2016-05-23 MED ORDER — SODIUM CHLORIDE 0.9 % IV SOLN
1.0000 g | Freq: Once | INTRAVENOUS | Status: AC
  Administered 2016-05-23: 1 g via INTRAVENOUS
  Filled 2016-05-23: qty 10

## 2016-05-23 MED ORDER — FUROSEMIDE 10 MG/ML IJ SOLN
40.0000 mg | Freq: Once | INTRAMUSCULAR | Status: AC
  Administered 2016-05-23: 40 mg via INTRAVENOUS
  Filled 2016-05-23: qty 4

## 2016-05-23 MED ORDER — INSULIN ASPART 100 UNIT/ML ~~LOC~~ SOLN: 5.0000 [IU] | Freq: Once | SUBCUTANEOUS | Status: DC

## 2016-05-23 MED ORDER — DEXTROSE 50 % IV SOLN
1.0000 | Freq: Once | INTRAVENOUS | Status: AC
  Administered 2016-05-23: 50 mL via INTRAVENOUS
  Filled 2016-05-23: qty 50

## 2016-05-23 MED ORDER — SODIUM POLYSTYRENE SULFONATE 15 GM/60ML PO SUSP
30.0000 g | Freq: Once | ORAL | Status: AC
  Administered 2016-05-23: 30 g via ORAL
  Filled 2016-05-23: qty 120

## 2016-05-23 MED ORDER — INSULIN REGULAR HUMAN 100 UNIT/ML IJ SOLN: 5.0000 [IU] | Freq: Once | INTRAMUSCULAR | Status: DC

## 2016-05-23 MED ORDER — INSULIN ASPART 100 UNIT/ML IV SOLN
10.0000 [IU] | Freq: Once | INTRAVENOUS | Status: AC
  Administered 2016-05-23: 10 [IU] via INTRAVENOUS

## 2016-05-23 NOTE — Progress Notes (Signed)
PROGRESS NOTE                                                                                                                                                                                                             Patient Demographics:    Catherine Phillips, is a 70 y.o. female, DOB - 08-11-1946, [id number]  Admit date - 05/22/2016   Admitting Physician Waldemar Dickens, MD  Outpatient Primary MD for the patient is Iran Planas, PA-C  LOS - 1  Chief Complaint  Patient presents with  . Diarrhea       Brief Narrative    Catherine Phillips is a 70 y.o. female with a Past Medical History of anxiety, COPD, diverticulosis, HTN, who presents with AKI and Hyperkalemia. Suspect this is in part due to bactrim use precipitating acute on chronic kidney disease. Of note pt also taking chronic NSAIDs and ACEi.    Subjective:    Catherine Phillips today has, No headache, No chest pain, No abdominal pain - No Nausea, No new weakness tingling or numbness, No Cough - SOB.     Assessment  & Plan :      1.ARF with Hyponatremia. Likely due to combination of recent Bactrim use, NSAID, ACE inhibitor use. All offending medications being held, hyperkalemia protocol be used, monitoring EKG currently stable, repeat Kayexalate, IV fluid bolus and Lasix given. We'll monitor as him again. Renal function improving.  2. COPD and asthma. On home oxygen, stable, continue nebulizer treatments as needed along with supportive care.  3. Peripheral neuropathy. Continue Lyrica.  4. Essential hypertension. For now on Norvasc along with diuretics, ACE inhibitor on hold.  5. GERD. On PPI.    Family Communication  :  None  Code Status :  Full  Diet : Heart Healthy  Disposition Plan  :  Home 1-2 days  Consults  :  None  Procedures  :  None  DVT Prophylaxis  :   Heparin    Lab Results  Component Value Date   PLT 260 05/23/2016    Inpatient  Medications  Scheduled Meds: . amLODipine  10 mg Oral Daily  . calcium gluconate  1 g Intravenous Once  . guaiFENesin  600 mg Oral BID  . heparin  5,000 Units Subcutaneous Q8H  . insulin aspart  5 Units Intravenous Once  .  pantoprazole  40 mg Oral BID AC  . pregabalin  50 mg Oral TID  . sodium chloride  500 mL Intravenous Once   Continuous Infusions: . sodium chloride 75 mL/hr at 05/23/16 0044   PRN Meds:.acetaminophen **OR** acetaminophen, albuterol, bisacodyl, mometasone-formoterol, ondansetron **OR** ondansetron (ZOFRAN) IV, polyethylene glycol, traZODone  Antibiotics  :    Anti-infectives    None         Objective:   Vitals:   05/22/16 1710 05/22/16 2006 05/23/16 0353 05/23/16 0809  BP:  (!) 139/38 (!) 151/60 (!) 135/43  Pulse:  81 91 91  Resp:  18 18 18   Temp:  99.4 F (37.4 C) 99.8 F (37.7 C) 98.7 F (37.1 C)  TempSrc:  Oral Oral Oral  SpO2: 90% 90% 92% 97%  Weight:   53.7 kg (118 lb 4.8 oz)   Height:        Wt Readings from Last 3 Encounters:  05/23/16 53.7 kg (118 lb 4.8 oz)  05/16/16 52.2 kg (115 lb)  04/25/16 51.7 kg (114 lb)     Intake/Output Summary (Last 24 hours) at 05/23/16 0933 Last data filed at 05/23/16 [id number]  Gross per 24 hour  Intake              702 ml  Output             1701 ml  Net             -999 ml     Physical Exam  Awake Alert, Oriented X 3, No new F.N deficits, Normal affect [place].AT,PERRAL Supple Neck,No JVD, No cervical lymphadenopathy appriciated.  Symmetrical Chest wall movement, Good air movement bilaterally, CTAB RRR,No Gallops,Rubs or new Murmurs, No Parasternal Heave +ve B.Sounds, Abd Soft, No tenderness, No organomegaly appriciated, No rebound - guarding or rigidity. No Cyanosis, Clubbing or edema, No new Rash or bruise      Data Review:    CBC  Recent Labs Lab 05/22/16 0918 05/23/16 0319  WBC 12.1* 9.9  HGB 14.9 11.9*  HCT 46.6* 40.2  PLT 313 260  MCV 96.9 101.0*  MCH 31.0 29.9  MCHC 32.0 29.6*    RDW 14.7 14.9  LYMPHSABS 2.5  --   MONOABS 0.8  --   EOSABS 2.3*  --   BASOSABS 0.1  --     Chemistries   Recent Labs Lab 05/22/16 0918 05/22/16 0959 05/22/16 1451 05/23/16 0319  NA 135  --  134* 141  K 7.3* 6.9* 6.5* 6.6*  CL 107  --  116* 114*  CO2 19*  --  15* 22  GLUCOSE 72  --  148* 80  BUN 36*  --  29* 22*  CREATININE 2.49*  --  2.30* 2.04*  CALCIUM 9.9  --  8.8* 8.3*  AST 22  --   --   --   ALT 13*  --   --   --   ALKPHOS 49  --   --   --   BILITOT 0.6  --   --   --    ------------------------------------------------------------------------------------------------------------------ No results for input(s): CHOL, HDL, LDLCALC, TRIG, CHOLHDL, LDLDIRECT in the last 72 hours.  No results found for: HGBA1C ------------------------------------------------------------------------------------------------------------------ No results for input(s): TSH, T4TOTAL, T3FREE, THYROIDAB in the last 72 hours.  Invalid input(s): FREET3 ------------------------------------------------------------------------------------------------------------------ No results for input(s): VITAMINB12, FOLATE, FERRITIN, TIBC, IRON, RETICCTPCT in the last 72 hours.  Coagulation profile No results for input(s): INR, PROTIME in the last 168 hours.  No  results for input(s): DDIMER in the last 72 hours.  Cardiac Enzymes No results for input(s): CKMB, TROPONINI, MYOGLOBIN in the last 168 hours.  Invalid input(s): CK ------------------------------------------------------------------------------------------------------------------    Component Value Date/Time   BNP 194.3 (H) 05/02/2015 0920    Micro Results Recent Results (from the past 240 hour(s))  Urine culture     Status: None   Collection Time: 05/16/16  2:38 PM  Result Value Ref Range Status   Culture ESCHERICHIA COLI  Final   Colony Count 10,000-50,000 CFU/mL  Final   Organism ID, Bacteria ESCHERICHIA COLI  Final      Susceptibility    Escherichia coli -  (no method available)    AMPICILLIN 8 Sensitive     AMOX/CLAVULANIC <=2 Sensitive     AMPICILLIN/SULBACTAM 4 Sensitive     PIP/TAZO <=4 Sensitive     IMIPENEM <=0.25 Sensitive     CEFAZOLIN <=4 Not Reportable     CEFTRIAXONE <=1 Sensitive     CEFTAZIDIME <=1 Sensitive     CEFEPIME <=1 Sensitive     GENTAMICIN <=1 Sensitive     TOBRAMYCIN <=1 Sensitive     CIPROFLOXACIN <=0.25 Sensitive     LEVOFLOXACIN <=0.12 Sensitive     NITROFURANTOIN <=16 Sensitive     TRIMETH/SULFA* <=20 Sensitive      * NR=NOT REPORTABLE,SEE COMMENTORAL therapy:A cefazolin MIC of <32 predicts susceptibility to the oral agents cefaclor,cefdinir,cefpodoxime,cefprozil,cefuroxime,cephalexin,and loracarbef when used for therapy of uncomplicated UTIs due to E.coli,K.pneumomiae,and P.mirabilis. PARENTERAL therapy: A cefazolinMIC of >8 indicates resistance to parenteralcefazolin. An alternate test method must beperformed to confirm susceptibility to parenteralcefazolin.    Radiology Reports No results found.  Time Spent in minutes  30   SINGH,PRASHANT K M.D on 05/23/2016 at 9:33 AM  Between 7am to 7pm - Pager - [phone]  After 7pm go to [email or web address] - password [facility]  [facility] -  Office  [phone]

## 2016-05-24 LAB — BASIC METABOLIC PANEL
ANION GAP: 12 (ref 5–15)
BUN: 11 mg/dL (ref 6–20)
CO2: 21 mmol/L — ABNORMAL LOW (ref 22–32)
Calcium: 8 mg/dL — ABNORMAL LOW (ref 8.9–10.3)
Chloride: 106 mmol/L (ref 101–111)
Creatinine, Ser: 1.47 mg/dL — ABNORMAL HIGH (ref 0.44–1.00)
GFR calc Af Amer: 41 mL/min — ABNORMAL LOW (ref >60)
GFR, EST NON AFRICAN AMERICAN: 35 mL/min — AB (ref >60)
Glucose, Bld: 78 mg/dL (ref 65–99)
POTASSIUM: 4.7 mmol/L (ref 3.5–5.1)
SODIUM: 139 mmol/L (ref 135–145)

## 2016-05-24 MED ORDER — LISINOPRIL 5 MG PO TABS: 5.0000 mg | ORAL_TABLET | Freq: Every day | ORAL | 0 refills | Status: DC

## 2016-05-24 NOTE — Patient Care Conference (Signed)
Patient refused bed alarm. Will continue to monitor patient. 

## 2016-05-24 NOTE — Discharge Instructions (Signed)
Follow with Primary MD Catherine Planas, PA-C in 2-3 days   Get CBC, CMP, 2 view Chest X ray checked  by Primary MD or SNF MD in 5-7 days ( we routinely change or add medications that can affect your baseline labs and fluid status, therefore we recommend that you get the mentioned basic workup next visit with your PCP, your PCP may decide not to get them or add new tests based on their clinical decision)   Activity: As tolerated with Full fall precautions use walker/cane & assistance as needed   Disposition Home     Diet:   Heart Healthy   For Heart failure patients - Check your Weight same time everyday, if you gain over 2 pounds, or you develop in leg swelling, experience more shortness of breath or chest pain, call your Primary MD immediately. Follow Cardiac Low Salt Diet and 1.5 lit/day fluid restriction.   On your next visit with your primary care physician please Get Medicines reviewed and adjusted.   Please request your Prim.MD to go over all Hospital Tests and Procedure/Radiological results at the follow up, please get all Hospital records sent to your Prim MD by signing hospital release before you go home.   If you experience worsening of your admission symptoms, develop shortness of breath, life threatening emergency, suicidal or homicidal thoughts you must seek medical attention immediately by calling 911 or calling your MD immediately  if symptoms less severe.  You Must read complete instructions/literature along with all the possible adverse reactions/side effects for all the Medicines you take and that have been prescribed to you. Take any new Medicines after you have completely understood and accpet all the possible adverse reactions/side effects.   Do not drive, operate heavy machinery, perform activities at heights, swimming or participation in water activities or provide baby sitting services if your were admitted for syncope or siezures until you have seen by Primary MD or a  Neurologist and advised to do so again.  Do not drive when taking Pain medications.    Do not take more than prescribed Pain, Sleep and Anxiety Medications  Special Instructions: If you have smoked or chewed Tobacco  in the last 2 yrs please stop smoking, stop any regular Alcohol  and or any Recreational drug use.  Wear Seat belts while driving.   Please note  You were cared for by a hospitalist during your hospital stay. If you have any questions about your discharge medications or the care you received while you were in the hospital after you are discharged, you can call the unit and asked to speak with the hospitalist on call if the hospitalist that took care of you is not available. Once you are discharged, your primary care physician will handle any further medical issues. Please note that NO REFILLS for any discharge medications will be authorized once you are discharged, as it is imperative that you return to your primary care physician (or establish a relationship with a primary care physician if you do not have one) for your aftercare needs so that they can reassess your need for medications and monitor your lab values.                                                       Catherine Phillips was admitted to the [facility]  on 05/22/2016 and Discharged  05/24/2016 and should be excused from work/school   for 3 days starting from date -  05/22/2016 , may return to work/school without any restrictions.  Call Lala Lund MD, [facility]  [phone] with questions.  Thurnell Lose M.D on 05/24/2016,at 8:55 AM  [facility]   Office  [phone]

## 2016-05-24 NOTE — Care Management Note (Signed)
Case Management Note  Patient Details  Name: Catherine Phillips MRN: [id number] Date of Birth: 08-Oct-1945  Subjective/Objective:     ARF, COPD               Action/Plan: Discharge Planning: AVS reviewed: NCM spoke to pt and states she will need note for work. States she works full-time. Attending contacted for work note. Received order to complete note.  PCP- Donella Stade MD  Expected Discharge Date:  05/24/2016               Expected Discharge Plan:  Home/Self Care  In-House Referral:  NA  Discharge planning Services  CM Consult  Post Acute Care Choice:  NA Choice offered to:  NA  DME Arranged:  N/A DME Agency:  NA  HH Arranged:  NA HH Agency:  NA  Status of Service:  Completed, signed off  If discussed at [place] of Stay Meetings, dates discussed:    Additional Comments:  Erenest Rasher, RN 05/24/2016, 10:38 AM

## 2016-05-24 NOTE — Discharge Summary (Signed)
Catherine Phillips [id number] DOB: 27-Jan-1946 DOA: 05/22/2016  PCP: Iran Planas, PA-C  Admit date: 05/22/2016  Discharge date: 05/24/2016  Admitted From: Home   Disposition:  Home   Recommendations for Outpatient Follow-up:   Follow up with PCP in 1-2 weeks  PCP Please obtain BMP/CBC, 2 view CXR in 1week,  (see Discharge instructions)   PCP Please follow up on the following pending results: None   Home Health: None   Equipment/Devices: None  Consultations: None Discharge Condition: Stable   CODE STATUS: Full   Diet Recommendation: Heart Healthy   Chief Complaint  Patient presents with  . Diarrhea     Brief history of present illness from the day of admission and additional interim summary      Catherine Corbinis a 70 y.o.femalewith a Past Medical History of anxiety, COPD, diverticulosis, HTN, who presents with AKI and Hyperkalemia. Suspect this is in part due to bactrim use precipitating acute on chronic kidney disease. Of note pt also taking chronic NSAIDs and ACEi.   Hospital issues addressed     1.ARF with Hyponatremia and hyperkalemia. Likely due to combination of recent Bactrim use, NSAID, ACE inhibitor use., She was treated with discontinuation of Bactrim, NSAID and ACE, IV fluids, renal function, hyponatremia and hyperkalemia have all resolved. Do not use Bactrim in the future, avoid NSAIDs, ACE inhibitor half home dose. Follow with PCP in 2-3 days to get repeat BMP and blood pressure check.  2. COPD and asthma. On home oxygen, stable, continue nebulizer treatments as needed along with supportive care.  3. Peripheral neuropathy. Continue Lyrica.  4. Essential hypertension. For now on Norvasc,Resume PCP to monitor blood pressure and BMP closely.  5. GERD. On PPI.   Discharge diagnosis      Active Problems:   COPD (chronic obstructive pulmonary disease) (HCC)   AKI (acute kidney injury) ([place])   Hyperkalemia    Discharge instructions    Discharge Instructions    Diet - low sodium heart healthy    Complete by:  As directed   Discharge instructions    Complete by:  As directed   Follow with Primary MD Iran Planas, PA-C in 2-3 days   Get CBC, CMP, 2 view Chest X ray checked  by Primary MD or SNF MD in 5-7 days ( we routinely change or add medications that can affect your baseline labs and fluid status, therefore we recommend that you get the mentioned basic workup next visit with your PCP, your PCP may decide not to get them or add new tests based on their clinical decision)   Activity: As tolerated with Full fall precautions use walker/cane & assistance as needed   Disposition Home     Diet:   Heart Healthy   For Heart failure patients - Check your Weight same time everyday, if you gain over 2 pounds, or you develop in leg swelling, experience more shortness of breath or chest pain, call your Primary MD immediately. Follow Cardiac Low Salt Diet and 1.5 lit/day fluid restriction.  On your next visit with your primary care physician please Get Medicines reviewed and adjusted.   Please request your Prim.MD to go over all Hospital Tests and Procedure/Radiological results at the follow up, please get all Hospital records sent to your Prim MD by signing hospital release before you go home.   If you experience worsening of your admission symptoms, develop shortness of breath, life threatening emergency, suicidal or homicidal thoughts you must seek medical attention immediately by calling 911 or calling your MD immediately  if symptoms less severe.  You Must read complete instructions/literature along with all the possible adverse reactions/side effects for all the Medicines you take and that have been prescribed to you. Take any new Medicines after you have completely  understood and accpet all the possible adverse reactions/side effects.   Do not drive, operate heavy machinery, perform activities at heights, swimming or participation in water activities or provide baby sitting services if your were admitted for syncope or siezures until you have seen by Primary MD or a Neurologist and advised to do so again.  Do not drive when taking Pain medications.    Do not take more than prescribed Pain, Sleep and Anxiety Medications  Special Instructions: If you have smoked or chewed Tobacco  in the last 2 yrs please stop smoking, stop any regular Alcohol  and or any Recreational drug use.  Wear Seat belts while driving.   Please note  You were cared for by a hospitalist during your hospital stay. If you have any questions about your discharge medications or the care you received while you were in the hospital after you are discharged, you can call the unit and asked to speak with the hospitalist on call if the hospitalist that took care of you is not available. Once you are discharged, your primary care physician will handle any further medical issues. Please note that NO REFILLS for any discharge medications will be authorized once you are discharged, as it is imperative that you return to your primary care physician (or establish a relationship with a primary care physician if you do not have one) for your aftercare needs so that they can reassess your need for medications and monitor your lab values.                                                       Catherine Phillips was admitted to the Hospital on 05/22/2016 and Discharged  05/24/2016 and should be excused from work/school   for 3 days starting from date -  05/22/2016 , may return to work/school without any restrictions.  Call Lala Lund MD, [facility]  [phone] with questions.  Thurnell Lose M.D on 05/24/2016,at 8:55 AM  [facility]   Office  [phone]   Increase activity slowly     Complete by:  As directed      Discharge Medications     Medication List    STOP taking these medications   celecoxib 100 MG capsule Commonly known as:  CELEBREX   ibuprofen 200 MG tablet Commonly known as:  ADVIL,MOTRIN     TAKE these medications   albuterol (2.5 MG/3ML) 0.083% nebulizer solution Commonly known as:  PROVENTIL Take 2.5 mg by nebulization every 6 (six) hours as needed for wheezing or shortness of breath.   albuterol 108 (90 Base)  MCG/ACT inhaler Commonly known as:  PROVENTIL HFA;VENTOLIN HFA Inhale 2 puffs into the lungs every 4 (four) hours as needed for wheezing or shortness of breath.   amLODipine 10 MG tablet Commonly known as:  NORVASC Take 1 tablet (10 mg total) by mouth daily.   budesonide-formoterol 160-4.5 MCG/ACT inhaler Commonly known as:  SYMBICORT Inhale 2 puffs into the lungs 2 (two) times daily. What changed:  when to take this  reasons to take this   FISH OIL PO Take 1 capsule by mouth daily.   lisinopril 5 MG tablet Commonly known as:  PRINIVIL,ZESTRIL Take 1 tablet (5 mg total) by mouth daily. What changed:  medication strength  how much to take   LYRICA 50 MG capsule Generic drug:  pregabalin TAKE ONE CAPSULE 3 TIMES A DAY   pantoprazole 40 MG tablet Commonly known as:  PROTONIX Take 2 tablets (80 mg total) by mouth daily. What changed:  how much to take  when to take this   Pitavastatin Calcium 4 MG Tabs Commonly known as:  LIVALO Take 1 tablet (4 mg total) by mouth every other day. What changed:  when to take this  additional instructions       Follow-up Information    Iran Planas, PA-C. Schedule an appointment as soon as possible for a visit today.   Specialty:  Family Medicine Contact information: [id number] [place] [address] [phone]           Major procedures and Radiology Reports - PLEASE review detailed and final reports thoroughly  -        No  results found.  Micro Results    Recent Results (from the past 240 hour(s))  Urine culture     Status: None   Collection Time: 05/16/16  2:38 PM  Result Value Ref Range Status   Culture ESCHERICHIA COLI  Final   Colony Count 10,000-50,000 CFU/mL  Final   Organism ID, Bacteria ESCHERICHIA COLI  Final      Susceptibility   Escherichia coli -  (no method available)    AMPICILLIN 8 Sensitive     AMOX/CLAVULANIC <=2 Sensitive     AMPICILLIN/SULBACTAM 4 Sensitive     PIP/TAZO <=4 Sensitive     IMIPENEM <=0.25 Sensitive     CEFAZOLIN <=4 Not Reportable     CEFTRIAXONE <=1 Sensitive     CEFTAZIDIME <=1 Sensitive     CEFEPIME <=1 Sensitive     GENTAMICIN <=1 Sensitive     TOBRAMYCIN <=1 Sensitive     CIPROFLOXACIN <=0.25 Sensitive     LEVOFLOXACIN <=0.12 Sensitive     NITROFURANTOIN <=16 Sensitive     TRIMETH/SULFA* <=20 Sensitive      * NR=NOT REPORTABLE,SEE COMMENTORAL therapy:A cefazolin MIC of <32 predicts susceptibility to the oral agents cefaclor,cefdinir,cefpodoxime,cefprozil,cefuroxime,cephalexin,and loracarbef when used for therapy of uncomplicated UTIs due to E.coli,K.pneumomiae,and P.mirabilis. PARENTERAL therapy: A cefazolinMIC of >8 indicates resistance to parenteralcefazolin. An alternate test method must beperformed to confirm susceptibility to parenteralcefazolin.  Urine culture     Status: None   Collection Time: 05/22/16 10:00 AM  Result Value Ref Range Status   Specimen Description URINE, CLEAN CATCH  Final   Special Requests NONE  Final   Culture NO GROWTH Performed at [facility]   Final   Report Status 05/23/2016 FINAL  Final    Today   Subjective    Catherine Phillips today has no headache,no chest abdominal pain,no new weakness tingling or numbness,  feels much better wants to go home today.    Objective   Blood pressure (!) 153/65, pulse 90, temperature 98.2 F (36.8 C), temperature source Oral, resp. rate 18, height 5\' 2"  (1.575 m), weight 52.5 kg  (115 lb 12.8 oz), SpO2 94 %.   Intake/Output Summary (Last 24 hours) at 05/24/16 0859 Last data filed at 05/23/16 2355  Gross per 24 hour  Intake          3181.25 ml  Output              801 ml  Net          2380.25 ml    Exam Awake Alert, Oriented x 3, No new F.N deficits, Normal affect [place].AT,PERRAL Supple Neck,No JVD, No cervical lymphadenopathy appriciated.  Symmetrical Chest wall movement, Good air movement bilaterally, CTAB RRR,No Gallops,Rubs or new Murmurs, No Parasternal Heave +ve B.Sounds, Abd Soft, Non tender, No organomegaly appriciated, No rebound -guarding or rigidity. No Cyanosis, Clubbing or edema, No new Rash or bruise   Data Review   CBC w Diff:  Lab Results  Component Value Date   WBC 9.9 05/23/2016   HGB 11.9 (L) 05/23/2016   HCT 40.2 05/23/2016   PLT 260 05/23/2016   LYMPHOPCT 20 05/22/2016   MONOPCT 6 05/22/2016   EOSPCT 19 05/22/2016   BASOPCT 1 05/22/2016    CMP:  Lab Results  Component Value Date   NA 139 05/24/2016   K 4.7 05/24/2016   CL 106 05/24/2016   CO2 21 (L) 05/24/2016   BUN 11 05/24/2016   CREATININE 1.47 (H) 05/24/2016   CREATININE 1.46 (H) 04/25/2016   PROT 7.8 05/22/2016   ALBUMIN 4.6 05/22/2016   BILITOT 0.6 05/22/2016   ALKPHOS 49 05/22/2016   AST 22 05/22/2016   ALT 13 (L) 05/22/2016  .   Total Time in preparing paper work, data evaluation and todays exam - 35 minutes  Thurnell Lose M.D on 05/24/2016 at [place]  [phone]

## 2016-05-24 NOTE — Progress Notes (Signed)
Patient needed a note for work at discharge.  Dr. Candiss Norse paged and told RN to write a separate note if needed since he put it on the AVS.  Pt. And husband given discharge instructions and all questions answered.  Pt. Discharged with all belongings via wheelchair.

## 2016-05-31 ENCOUNTER — Encounter: Payer: Self-pay | Admitting: Physician Assistant

## 2016-05-31 ENCOUNTER — Ambulatory Visit (INDEPENDENT_AMBULATORY_CARE_PROVIDER_SITE_OTHER): Payer: 59 | Admitting: Physician Assistant

## 2016-05-31 VITALS — BP 137/64 | HR 77 | Ht 62.0 in | Wt 115.0 lb

## 2016-05-31 DIAGNOSIS — E875 Hyperkalemia: Secondary | ICD-10-CM | POA: Diagnosis not present

## 2016-05-31 DIAGNOSIS — T887XXD Unspecified adverse effect of drug or medicament, subsequent encounter: Secondary | ICD-10-CM | POA: Diagnosis not present

## 2016-05-31 DIAGNOSIS — T50905D Adverse effect of unspecified drugs, medicaments and biological substances, subsequent encounter: Secondary | ICD-10-CM

## 2016-05-31 DIAGNOSIS — N179 Acute kidney failure, unspecified: Secondary | ICD-10-CM | POA: Diagnosis not present

## 2016-05-31 DIAGNOSIS — T50905A Adverse effect of unspecified drugs, medicaments and biological substances, initial encounter: Secondary | ICD-10-CM | POA: Insufficient documentation

## 2016-05-31 LAB — BASIC METABOLIC PANEL WITH GFR
BUN: 29 mg/dL — AB (ref 7–25)
CALCIUM: 9.6 mg/dL (ref 8.6–10.4)
CHLORIDE: 108 mmol/L (ref 98–110)
CO2: 24 mmol/L (ref 20–31)
CREATININE: 1.43 mg/dL — AB (ref 0.50–0.99)
GFR, Est African American: 43 mL/min — ABNORMAL LOW (ref >=60)
GFR, Est Non African American: 37 mL/min — ABNORMAL LOW (ref >=60)
Glucose, Bld: 61 mg/dL — ABNORMAL LOW (ref 65–99)
Potassium: 5.3 mmol/L (ref 3.5–5.3)
Sodium: 138 mmol/L (ref 135–146)

## 2016-05-31 MED ORDER — ALBUTEROL SULFATE (2.5 MG/3ML) 0.083% IN NEBU: 2.5000 mg | INHALATION_SOLUTION | Freq: Four times a day (QID) | RESPIRATORY_TRACT | 1 refills | Status: DC | PRN

## 2016-05-31 NOTE — Progress Notes (Addendum)
Subjective:     Patient ID: Catherine Phillips, female   DOB: March 11, 1946, 70 y.o.   MRN: [id number]  HPI Patient is a 70 y.o. Caucasian female with a past medical history of anxiety, COPD, diverticulosis, and HTN presenting today following a recent hospitalization on 05/22/2016. Patient was hospitalized at [facility] with findings of hyperkalemia and AKI. The patient was previously seen by Dr. Ileene Phillips on 05/16/2016 for a UTI and was placed on Bactrim Ds. The patient reports that she did not feel well on this medication and began having severe leg cramping and abdominal pain. The patient reports that she did not have any cardiac symptoms at the time.  Patient recently admitted to the hospital on 05/22/2016 for hyperkalemia and AKI. Discharge report as follows: "1.ARF with Hyponatremia and hyperkalemia. Likely due to combination of recent Bactrim use, NSAID, ACE inhibitor use., She was treated with discontinuation of Bactrim, NSAID and ACE, IV fluids, renal function, hyponatremia and hyperkalemia have all resolved. Do not use Bactrim in the future, avoid NSAIDs, ACE inhibitor half home dose. Follow with PCP in 2-3 days to get repeat BMP and blood pressure check. EKG NSR with diffuse t-waves.     The patient states that she is currently feels well and all of her UTI symptoms have resolved. Patient denies fever, abdominal pain, dysuria, muscle cramps, or fatigue. She has not restarted celebrex.   Review of Systems  Constitutional: Negative for activity change, appetite change, chills, diaphoresis, fatigue, fever and unexpected weight change.  HENT: Negative for congestion, ear discharge, ear pain, postnasal drip, rhinorrhea, sinus pressure and sore throat.   Eyes: Negative for photophobia, pain, redness, itching and visual disturbance.  Respiratory: Negative for apnea, cough, choking, chest tightness, shortness of breath, wheezing and stridor.   Cardiovascular: Negative for chest pain, palpitations and leg  swelling.  Gastrointestinal: Negative for abdominal distention, abdominal pain, blood in stool, constipation, diarrhea, nausea and vomiting.  Genitourinary: Negative for difficulty urinating, dysuria, flank pain, frequency, hematuria, urgency, vaginal discharge and vaginal pain.  Musculoskeletal: Negative for arthralgias, back pain and myalgias.  Neurological: Negative for dizziness, weakness, light-headedness, numbness and headaches.  Psychiatric/Behavioral: The patient is not nervous/anxious.       Objective:   Physical Exam  Constitutional: She is oriented to person, place, and time. She appears well-developed and well-nourished. No distress.  HENT:  Head: Normocephalic and atraumatic.  Right Ear: External ear normal.  Left Ear: External ear normal.  Nose: Nose normal.  Mouth/Throat: Oropharynx is clear and moist. No oropharyngeal exudate.  Eyes: Conjunctivae and EOM are normal. Pupils are equal, round, and reactive to light. Right eye exhibits no discharge. Left eye exhibits no discharge. No scleral icterus.  Neck: Normal range of motion. Neck supple. No JVD present. No tracheal deviation present. No thyromegaly present.  Cardiovascular: Normal rate, regular rhythm, normal heart sounds and intact distal pulses.  Exam reveals no gallop and no friction rub.   No murmur heard. Pulmonary/Chest: Effort normal and breath sounds normal. No stridor. No respiratory distress. She has no wheezes. She has no rales. She exhibits no tenderness.  Abdominal: Soft. Bowel sounds are normal. She exhibits no distension and no mass. There is no tenderness. There is no rebound and no guarding.  Lymphadenopathy:    She has no cervical adenopathy.  Neurological: She is alert and oriented to person, place, and time. No cranial nerve deficit. Coordination normal.  Skin: Skin is warm and dry. No rash noted. She is not diaphoretic. No erythema.  No pallor.  Psychiatric: She has a normal mood and affect. Her  behavior is normal. Judgment and thought content normal.      Assessment:     Catherine Phillips was seen today for hospitalization follow-up.  Diagnoses and all orders for this visit:  Hyperkalemia -     BASIC METABOLIC PANEL WITH GFR  Medication reaction, subsequent encounter -     BASIC METABOLIC PANEL WITH GFR  Other orders -     albuterol (PROVENTIL) (2.5 MG/3ML) 0.083% nebulizer solution; Take 3 mLs (2.5 mg total) by nebulization every 6 (six) hours as needed for wheezing or shortness of breath.      Plan:     1. Post hospital follow-up/AKI/hyperkalemia - Patient to repeat CMP today to assess renal function before re-starting celebrex.Will call patient with laboratory results.  2. COPD - Patient to continue on current medication regimen. Patient was given refill for albuterol nebulizer solution. Will continue to monitor.

## 2016-06-02 ENCOUNTER — Other Ambulatory Visit: Payer: Self-pay

## 2016-06-02 NOTE — Progress Notes (Signed)
Yes send a 6 month refill.

## 2016-06-05 ENCOUNTER — Other Ambulatory Visit: Payer: Self-pay | Admitting: Family Medicine

## 2016-06-05 ENCOUNTER — Other Ambulatory Visit: Payer: Self-pay | Admitting: *Deleted

## 2016-06-05 MED ORDER — PITAVASTATIN CALCIUM 4 MG PO TABS: 4.0000 mg | ORAL_TABLET | ORAL | 3 refills | Status: DC

## 2016-06-06 ENCOUNTER — Other Ambulatory Visit: Payer: Self-pay | Admitting: Physician Assistant

## 2016-06-06 ENCOUNTER — Telehealth: Payer: Self-pay | Admitting: *Deleted

## 2016-06-06 DIAGNOSIS — E875 Hyperkalemia: Secondary | ICD-10-CM

## 2016-06-06 DIAGNOSIS — N179 Acute kidney failure, unspecified: Secondary | ICD-10-CM

## 2016-06-06 DIAGNOSIS — R7989 Other specified abnormal findings of blood chemistry: Secondary | ICD-10-CM

## 2016-06-06 LAB — BASIC METABOLIC PANEL
BUN: 26 mg/dL — ABNORMAL HIGH (ref 7–25)
CHLORIDE: 104 mmol/L (ref 98–110)
CO2: 25 mmol/L (ref 20–31)
Calcium: 9.6 mg/dL (ref 8.6–10.4)
Creat: 1.63 mg/dL — ABNORMAL HIGH (ref 0.50–0.99)
Glucose, Bld: 83 mg/dL (ref 65–99)
POTASSIUM: 5.6 mmol/L — AB (ref 3.5–5.3)
SODIUM: 141 mmol/L (ref 135–146)

## 2016-06-06 NOTE — Telephone Encounter (Signed)
Labs ordered.

## 2016-06-07 ENCOUNTER — Telehealth: Payer: Self-pay | Admitting: *Deleted

## 2016-06-07 DIAGNOSIS — R8290 Unspecified abnormal findings in urine: Secondary | ICD-10-CM

## 2016-06-07 DIAGNOSIS — R7989 Other specified abnormal findings of blood chemistry: Secondary | ICD-10-CM

## 2016-06-07 NOTE — Addendum Note (Signed)
Addended by: Donella Stade on: 06/07/2016 01:32 PM   Modules accepted: Orders

## 2016-06-07 NOTE — Telephone Encounter (Signed)
Labs ordered.

## 2016-06-09 ENCOUNTER — Other Ambulatory Visit: Payer: Self-pay

## 2016-06-09 DIAGNOSIS — E875 Hyperkalemia: Secondary | ICD-10-CM

## 2016-06-09 DIAGNOSIS — N179 Acute kidney failure, unspecified: Secondary | ICD-10-CM

## 2016-06-13 ENCOUNTER — Ambulatory Visit (HOSPITAL_BASED_OUTPATIENT_CLINIC_OR_DEPARTMENT_OTHER): Payer: Medicare Other

## 2016-06-13 ENCOUNTER — Ambulatory Visit (INDEPENDENT_AMBULATORY_CARE_PROVIDER_SITE_OTHER): Payer: 59 | Admitting: Podiatry

## 2016-06-13 ENCOUNTER — Encounter: Payer: Self-pay | Admitting: Podiatry

## 2016-06-13 DIAGNOSIS — R52 Pain, unspecified: Secondary | ICD-10-CM | POA: Diagnosis not present

## 2016-06-13 DIAGNOSIS — M722 Plantar fascial fibromatosis: Secondary | ICD-10-CM

## 2016-06-13 LAB — BASIC METABOLIC PANEL WITH GFR
BUN: 28 mg/dL — ABNORMAL HIGH (ref 7–25)
CALCIUM: 10.1 mg/dL (ref 8.6–10.4)
CO2: 22 mmol/L (ref 20–31)
Chloride: 106 mmol/L (ref 98–110)
Creat: 1.48 mg/dL — ABNORMAL HIGH (ref 0.60–0.93)
GFR, EST AFRICAN AMERICAN: 41 mL/min — AB (ref >=60)
GFR, EST NON AFRICAN AMERICAN: 36 mL/min — AB (ref >=60)
GLUCOSE: 75 mg/dL (ref 65–99)
POTASSIUM: 4.8 mmol/L (ref 3.5–5.3)
SODIUM: 142 mmol/L (ref 135–146)

## 2016-06-13 NOTE — Progress Notes (Signed)
   Subjective:    Patient ID: Catherine Phillips, female    DOB: 1946/01/23, 70 y.o.   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 She currently denies any pain to her feet and she denies any recent injury or trauma. This is been ongoing for several months. She does have neuropathy and she is taking Lyrica which helps. She is requesting custom orthotics at this time. No other complaints.   Review of Systems  All other systems reviewed and are negative.      Objective:   Physical Exam General: AAO x3, NAD  Dermatological: Skin is warm, dry and supple bilateral. Nails x 10 are well manicured; remaining integument appears unremarkable at this time. There are no open sores, no preulcerative lesions, no rash or signs of infection present.  Vascular: Dorsalis Pedis artery and Posterior Tibial artery pedal pulses are 2/4 bilateral with immedate capillary fill time. There is no pain with calf compression, swelling, warmth, erythema.   Neruologic: Grossly intact via light touch bilateral. Vibratory intact via tuning fork bilateral. Protective threshold with Semmes Wienstein monofilament intact to all pedal sites bilateral.   Musculoskeletal: At this time there is no area of tenderness to bilateral lower extremity is there is no area pinpoint bony tenderness or pain the vibratory sensation. There is no amount edema, erythema, increase in warmth. On the medial female plantar fashion the arch of the foot is worse she subjectively gets discomfort however she is having no pain today. MMT 5/5. Range of motion intact.  Gait: Unassisted, Nonantalgic.     Assessment & Plan:  70 year old female bilateral plantar fasciitis, requesting orthotics -Treatment options discussed including all alternatives, risks, and complications -Etiology of  symptoms were discussed -Declined x-ray -She was scanned for orthotics and they were sent to [facility] labs. -Discussed shoe gear modifications as well -Follow-up in 3-4 weeks to pick up evidence or sooner if needed. Call any questions or concerns meantime.  Celesta Gentile, DPM

## 2016-06-14 LAB — URINALYSIS
Bilirubin Urine: NEGATIVE
GLUCOSE, UA: NEGATIVE
HGB URINE DIPSTICK: NEGATIVE
Ketones, ur: NEGATIVE
Leukocytes, UA: NEGATIVE
NITRITE: NEGATIVE
PH: 6 (ref 5.0–8.0)
PROTEIN: NEGATIVE
Specific Gravity, Urine: 1.013 (ref 1.001–1.035)

## 2016-06-14 LAB — URINE CULTURE

## 2016-06-14 NOTE — Telephone Encounter (Signed)
Are you checking your BP? Needs to stay at least under 150/90. Stay off of it at least until we get renal u/s to make sure there is no stenosis.

## 2016-06-15 ENCOUNTER — Encounter: Payer: Self-pay | Admitting: Adult Health

## 2016-06-15 ENCOUNTER — Ambulatory Visit (INDEPENDENT_AMBULATORY_CARE_PROVIDER_SITE_OTHER): Payer: 59 | Admitting: Adult Health

## 2016-06-15 ENCOUNTER — Ambulatory Visit: Payer: Medicare Other | Admitting: Adult Health

## 2016-06-15 DIAGNOSIS — R918 Other nonspecific abnormal finding of lung field: Secondary | ICD-10-CM | POA: Diagnosis not present

## 2016-06-15 DIAGNOSIS — J439 Emphysema, unspecified: Secondary | ICD-10-CM

## 2016-06-15 NOTE — Progress Notes (Signed)
Subjective:    Patient ID: Catherine Phillips, female    DOB: [birth year], 70 y.o.   MRN: [id number]  HPI 70 yo female former smoker with moderate COPD (GOLD II)  And Lung nodules   TEST  10/2014 FEV1 60% CT chest 07/2014 - changes of ILD , stable. Mod emphysema, nodules decreased compared to 04/2014 except new one RUL CT chest 12/21/15 >Two 10 x 8 mm spiculated left lower lobe nodules, new from 2016 CT abdomen/pelvis.Additional 7 x 5 mm left upper lobe nodule, new from 2015 CT chest. CT chest 03/2016 >Waxing and waning pulmonary nodules most consistent with a benign inflammatory or infectious process.2. Centrilobular emphysema in the upper limits.. Stable pleural-parenchymal nodular thickening at the lung apices.  06/15/2016 Follow up : COPD /lung nodules  Pt returns for 6 month follow up for COPD .  She says she is doing excellent . Breathing is best in long time.  Rarely uses Symbicort. Says she feels she has not needed it.  Denies cough , wheezing, hemoptysis , chest pain or edema.   She has known waxing and waning lung nodules being followed on serial CT chest .  Most recent CT chest 03/2016 showed stable RUL nodularity , decreased LLL and LUL nodule, new RLL nodule  No hemoptysis or wt loss.   Recent admission for hyperkalemia and renal failure on ACE , Bactrim and NSAIDs. She improved with hydration and d/c of these 3 medications. Says she feels the best she has in years. Feels   Works at [employer] full time.   Past Medical History:  Diagnosis Date  . Anxiety 06/24/2012  . COPD (chronic obstructive pulmonary disease) ([place])   . Diverticulosis   . Emphysema 06/24/2012  . Emphysema of lung ([place])   . Essential hypertension 06/24/2012  . History of Left leg claudication 06/24/2012  . Hypertension   . Shortness of breath dyspnea    Current Outpatient Prescriptions on File Prior to Visit  Medication Sig Dispense Refill  . albuterol (PROVENTIL HFA;VENTOLIN HFA) 108 (90 Base) MCG/ACT inhaler  Inhale 2 puffs into the lungs every 4 (four) hours as needed for wheezing or shortness of breath. 1 Inhaler 11  . albuterol (PROVENTIL) (2.5 MG/3ML) 0.083% nebulizer solution Take 3 mLs (2.5 mg total) by nebulization every 6 (six) hours as needed for wheezing or shortness of breath. 75 mL 1  . budesonide-formoterol (SYMBICORT) 160-4.5 MCG/ACT inhaler Inhale 2 puffs into the lungs 2 (two) times daily. (Patient taking differently: Inhale 2 puffs into the lungs 2 (two) times daily as needed (shortness of breath/ wheezing). ) 1 Inhaler 0  . lisinopril (PRINIVIL,ZESTRIL) 5 MG tablet Take 1 tablet (5 mg total) by mouth daily. 30 tablet 0  . LYRICA 50 MG capsule TAKE ONE CAPSULE 3 TIMES A DAY 270 capsule 1  . Omega-3 Fatty Acids (FISH OIL PO) Take 1 capsule by mouth daily.    . pantoprazole (PROTONIX) 40 MG tablet Take 2 tablets (80 mg total) by mouth daily. (Patient taking differently: Take 40 mg by mouth 2 (two) times daily before a meal. ) 180 tablet 1  . Pitavastatin Calcium (LIVALO) 4 MG TABS Take 1 tablet (4 mg total) by mouth every other day. 90 tablet 3   No current facility-administered medications on file prior to visit.     Review of Systems Constitutional:   No  weight loss, night sweats,  Fevers, chills, fatigue, or  lassitude.  HEENT:   No headaches,  Difficulty swallowing,  Tooth/dental problems,  or  Sore throat,                No sneezing, itching, ear ache, nasal congestion, post nasal drip,   CV:  No chest pain,  Orthopnea, PND, swelling in lower extremities, anasarca, dizziness, palpitations, syncope.   GI  No heartburn, indigestion, abdominal pain, nausea, vomiting, diarrhea, change in bowel habits, loss of appetite, bloody stools.   Resp: No shortness of breath with exertion or at rest.  No excess mucus, no productive cough,  No non-productive cough,  No coughing up of blood.  No change in color of mucus.  No wheezing.  No chest wall deformity  Skin: no rash or lesions.  GU:  no dysuria, change in color of urine, no urgency or frequency.  No flank pain, no hematuria   MS:  No joint pain or swelling.  No decreased range of motion.  No back pain.  Psych:  No change in mood or affect. No depression or anxiety.  No memory loss.         Objective:   Physical Exam Vitals:   06/15/16 0942  BP: (!) 152/72  Pulse: 83  SpO2: 90%  Weight: 112 lb (50.8 kg)  Height: 5\' 2"  (1.575 m)   GEN: A/Ox3; pleasant , NAD, well nourished    HEENT:  [place]/AT,  EACs-clear, TMs-wnl, NOSE-clear, THROAT-clear, no lesions, no postnasal drip or exudate noted.   NECK:  Supple w/ fair ROM; no JVD; normal carotid impulses w/o bruits; no thyromegaly or nodules palpated; no lymphadenopathy.    RESP  Clear  P & A; w/o, wheezes/ rales/ or rhonchi. no accessory muscle use, no dullness to percussion  CARD:  RRR, no m/r/g  , no peripheral edema, pulses intact, no cyanosis or clubbing.  GI:   Soft & nt; nml bowel sounds; no organomegaly or masses detected.   Musco: Warm bil, no deformities or joint swelling noted.   Neuro: alert, no focal deficits noted.    Skin: Warm, no lesions or rashes  Kendrea Cerritos NP-C  [facility]  06/15/2016        Assessment & Plan:

## 2016-06-15 NOTE — Assessment & Plan Note (Signed)
Most likely benign as they seem to wax and wane. There are some new nodules on most recent CT  Will plan follow up CT chest in 03/2017 .

## 2016-06-15 NOTE — Patient Instructions (Signed)
Keep up good work .  Continue on current regimen  Follow up Dr. Elsworth Soho  In 6 months and As needed

## 2016-06-15 NOTE — Addendum Note (Signed)
Addended by: Mathis Dad on: 06/15/2016 03:36 PM   Modules accepted: Orders

## 2016-06-15 NOTE — Assessment & Plan Note (Signed)
Controlled , currently not on regular controller. She is asymptomatic.  Advised to add Symbicort back if cough or wheezing return   Plan  Patient Instructions  Keep up good work .  Continue on current regimen  Follow up Dr. Elsworth Soho  In 6 months and As needed

## 2016-06-19 NOTE — Progress Notes (Signed)
Reviewed & agree with plan  

## 2016-06-20 ENCOUNTER — Ambulatory Visit (HOSPITAL_COMMUNITY)
Admission: RE | Admit: 2016-06-20 | Discharge: 2016-06-20 | Disposition: A | Discharge status: Home / Self Care | Payer: 59 | Source: Ambulatory Visit | Attending: Physician Assistant | Admitting: Physician Assistant

## 2016-06-20 DIAGNOSIS — N179 Acute kidney failure, unspecified: Secondary | ICD-10-CM | POA: Diagnosis not present

## 2016-06-20 DIAGNOSIS — E875 Hyperkalemia: Secondary | ICD-10-CM | POA: Insufficient documentation

## 2016-06-20 NOTE — Progress Notes (Addendum)
Renal Duplex Completed. Left message for Iran Planas, Pa-C to in voicemail to call for results. Oda Cogan, BS, RDMS, RVT

## 2016-06-21 ENCOUNTER — Encounter: Payer: Self-pay | Admitting: Physician Assistant

## 2016-06-21 DIAGNOSIS — I701 Atherosclerosis of renal artery: Secondary | ICD-10-CM | POA: Insufficient documentation

## 2016-06-22 ENCOUNTER — Telehealth: Payer: Self-pay | Admitting: *Deleted

## 2016-06-22 DIAGNOSIS — I701 Atherosclerosis of renal artery: Secondary | ICD-10-CM

## 2016-06-22 NOTE — Telephone Encounter (Signed)
Referral placed.

## 2016-06-28 ENCOUNTER — Ambulatory Visit (INDEPENDENT_AMBULATORY_CARE_PROVIDER_SITE_OTHER): Payer: 59 | Admitting: Osteopathic Medicine

## 2016-06-28 DIAGNOSIS — Z23 Encounter for immunization: Secondary | ICD-10-CM

## 2016-06-28 NOTE — Progress Notes (Signed)
Pt here for flu shot. She tolerated well.Catherine Phillips  

## 2016-07-11 ENCOUNTER — Ambulatory Visit (INDEPENDENT_AMBULATORY_CARE_PROVIDER_SITE_OTHER): Payer: 59 | Admitting: Podiatry

## 2016-07-11 ENCOUNTER — Encounter: Payer: Self-pay | Admitting: Podiatry

## 2016-07-11 DIAGNOSIS — M722 Plantar fascial fibromatosis: Secondary | ICD-10-CM

## 2016-07-11 NOTE — Patient Instructions (Signed)

## 2016-07-11 NOTE — Progress Notes (Signed)
Patient persisted pickup orthotics. She states that she was doing well. Orthotics were dispensed as well as oral and written break in instructions. She was seen today by Cranford Mon, CMA. She was offered to be seen by myself but she declined. Follow-up in 4 weeks or sooner if needed.

## 2016-07-12 ENCOUNTER — Other Ambulatory Visit: Payer: Self-pay

## 2016-07-12 ENCOUNTER — Telehealth: Payer: Self-pay

## 2016-07-12 MED ORDER — PREGABALIN 50 MG PO CAPS: ORAL_CAPSULE | ORAL | 1 refills | Status: DC

## 2016-07-12 NOTE — Telephone Encounter (Signed)
Yes, ok for 3 month refill. She was just seen in office.

## 2016-07-12 NOTE — Telephone Encounter (Signed)
Pt called requesting a refill of lyrica. Is this appropriate?

## 2016-07-12 NOTE — Telephone Encounter (Signed)
Rx refilled.

## 2016-07-24 DIAGNOSIS — N183 Chronic kidney disease, stage 3 (moderate): Secondary | ICD-10-CM | POA: Diagnosis not present

## 2016-07-24 DIAGNOSIS — N189 Chronic kidney disease, unspecified: Secondary | ICD-10-CM | POA: Diagnosis not present

## 2016-08-15 ENCOUNTER — Ambulatory Visit (INDEPENDENT_AMBULATORY_CARE_PROVIDER_SITE_OTHER): Payer: 59 | Admitting: Physician Assistant

## 2016-08-15 ENCOUNTER — Encounter: Payer: Self-pay | Admitting: Physician Assistant

## 2016-08-15 VITALS — BP 161/61 | HR 79 | Ht 62.0 in | Wt 114.0 lb

## 2016-08-15 DIAGNOSIS — I701 Atherosclerosis of renal artery: Secondary | ICD-10-CM | POA: Diagnosis not present

## 2016-08-15 DIAGNOSIS — I10 Essential (primary) hypertension: Secondary | ICD-10-CM

## 2016-08-15 NOTE — Progress Notes (Signed)
   Subjective:    Patient ID: Catherine Phillips, female    DOB: March 14, 1946, 70 y.o.   MRN: [id number]  HPI Repeat ABI's 2014.    Review of Systems     Objective:   Physical Exam        Assessment & Plan:

## 2016-08-15 NOTE — Progress Notes (Addendum)
  Subjective:    Patient ID: Catherine Phillips, female    DOB: 1946-03-26, 70 y.o.   MRN: [id number]  HPI Patient is a 70 yo female coming to the clinic today with complaints of hypertension and foot swelling. Patient went to the nephrologist on November 6 of this year. Her blood pressure was elevated due to being taken off of lisinopril due to her renal artery stenosis. The nephrologist then put the patient back on amlodipine 5 mg daily. Patient told the nephrologist that she was put on amlodipine in the past and that it caused her ankles to swell. Patient is currently experiencing left foot swelling. Patient states that she recently started keeping a blood pressure log in the mornings and after work. Patient states that her blood pressure has been running in the around 160/90 every day. She states that she knows when her blood pressure is high because she begins to feel dizzy at work and has to sit down for a few minutes. Patient denies chest pain, palpitations and shortness of breath.   Review of Systems See HPI    Objective:   Physical Exam  Constitutional: No distress.  Cardiovascular: Normal rate and regular rhythm.  Exam reveals no gallop and no friction rub.   No murmur heard. Pulmonary/Chest: Effort normal and breath sounds normal. No respiratory distress. She has no wheezes. She has no rales.  Musculoskeletal:  Left foot has slight edema compared to the right foot.   Vascular: Could not feel PT pulses on left side. PT pulses palpable and +2 on right side. DP pulses +2 bilaterally.         Assessment & Plan:  Catherine Phillips was seen today for hypertension and foot swelling.  Diagnoses and all orders for this visit:  1. Renal artery stenosis ([place]) -Managed by nephrology   2. Essential hypertension -Continue taking amlodipine for hypertension for now. Will call Dr. Gerarda Gunther with nephrology to see if he thinks that there is a better option since the patient is experiencing swelling of  her left foot.   Called neurologist: suggested lisinopril 5mg  and HCTZ 12.5mg  daily and recheck in 10 days BMP with GFR. Stay on amlodipine. If BP still not controlled under 130/80. Can always send back to nephrology to control .

## 2016-08-16 ENCOUNTER — Telehealth: Payer: Self-pay | Admitting: Physician Assistant

## 2016-08-16 MED ORDER — LISINOPRIL 5 MG PO TABS: 5.0000 mg | ORAL_TABLET | Freq: Every day | ORAL | 0 refills | Status: DC

## 2016-08-16 MED ORDER — HYDROCHLOROTHIAZIDE 12.5 MG PO TABS: 12.5000 mg | ORAL_TABLET | Freq: Every day | ORAL | 0 refills | Status: DC

## 2016-08-16 NOTE — Addendum Note (Signed)
Addended by: Donella Stade on: 08/16/2016 05:21 PM   Modules accepted: Orders

## 2016-08-17 NOTE — Telephone Encounter (Signed)
[facility] notifying pt of new prescriptions.

## 2016-08-24 ENCOUNTER — Telehealth: Payer: Self-pay | Admitting: *Deleted

## 2016-08-24 DIAGNOSIS — I701 Atherosclerosis of renal artery: Secondary | ICD-10-CM

## 2016-08-24 NOTE — Telephone Encounter (Signed)
Labs ordered.

## 2016-08-26 LAB — BASIC METABOLIC PANEL WITH GFR
BUN: 32 mg/dL — ABNORMAL HIGH (ref 7–25)
CALCIUM: 9.1 mg/dL (ref 8.6–10.4)
CO2: 19 mmol/L — ABNORMAL LOW (ref 20–31)
Chloride: 107 mmol/L (ref 98–110)
Creat: 1.55 mg/dL — ABNORMAL HIGH (ref 0.60–0.93)
GFR, EST AFRICAN AMERICAN: 39 mL/min — AB (ref >=60)
GFR, EST NON AFRICAN AMERICAN: 34 mL/min — AB (ref >=60)
GLUCOSE: 103 mg/dL — AB (ref 65–99)
POTASSIUM: 5.1 mmol/L (ref 3.5–5.3)
SODIUM: 138 mmol/L (ref 135–146)

## 2016-08-29 DIAGNOSIS — N183 Chronic kidney disease, stage 3 (moderate): Secondary | ICD-10-CM | POA: Diagnosis not present

## 2016-08-29 NOTE — Telephone Encounter (Signed)
Call pt: kidney function did worsen from adding lisinopril. Have you been checking BP? I spoke with nephrologist. He would like for you to come in for a recheck. Please call and make appt. Let me know if not able to get a appt in the next week.

## 2016-08-31 ENCOUNTER — Telehealth: Payer: Self-pay

## 2016-08-31 NOTE — Telephone Encounter (Signed)
Dr Aundra Dubin called and would like Catherine Phillips to return his call to talk about Catherine Phillips Cell [phone]. He is aware Catherine Phillips will be out of the office until tomorrow.

## 2016-09-01 ENCOUNTER — Other Ambulatory Visit: Payer: Self-pay | Admitting: Physician Assistant

## 2016-09-01 NOTE — Progress Notes (Signed)
Discussed with Dr. Aundra Dubin, nephrologist, he stopped ACE/HCTZ due to greater than 30 percent increase in serum creatine. Placed on labetalol. Will follow up with him.

## 2016-09-13 ENCOUNTER — Other Ambulatory Visit: Payer: Self-pay | Admitting: Physician Assistant

## 2016-09-15 ENCOUNTER — Other Ambulatory Visit: Payer: Self-pay

## 2016-09-15 DIAGNOSIS — N289 Disorder of kidney and ureter, unspecified: Secondary | ICD-10-CM

## 2016-09-20 LAB — BASIC METABOLIC PANEL WITH GFR
BUN: 21 mg/dL (ref 7–25)
CO2: 26 mmol/L (ref 20–31)
CREATININE: 1.25 mg/dL — AB (ref 0.60–0.93)
Calcium: 9.1 mg/dL (ref 8.6–10.4)
Chloride: 107 mmol/L (ref 98–110)
GFR, EST AFRICAN AMERICAN: 50 mL/min — AB (ref >=60)
GFR, Est Non African American: 44 mL/min — ABNORMAL LOW (ref >=60)
Glucose, Bld: 92 mg/dL (ref 65–99)
POTASSIUM: 4.8 mmol/L (ref 3.5–5.3)
SODIUM: 142 mmol/L (ref 135–146)

## 2016-10-03 ENCOUNTER — Other Ambulatory Visit: Payer: Self-pay | Admitting: *Deleted

## 2016-10-03 MED ORDER — PITAVASTATIN CALCIUM 4 MG PO TABS: 4.0000 mg | ORAL_TABLET | ORAL | 3 refills | Status: DC

## 2016-10-20 ENCOUNTER — Telehealth: Payer: Self-pay | Admitting: *Deleted

## 2016-10-20 DIAGNOSIS — M79605 Pain in left leg: Principal | ICD-10-CM

## 2016-10-20 DIAGNOSIS — E875 Hyperkalemia: Secondary | ICD-10-CM

## 2016-10-20 DIAGNOSIS — M79604 Pain in right leg: Secondary | ICD-10-CM

## 2016-10-20 LAB — CBC
HCT: 38 % (ref 35.0–45.0)
Hemoglobin: 12.7 g/dL (ref 11.7–15.5)
MCH: 31.6 pg (ref 27.0–33.0)
MCHC: 33.4 g/dL (ref 32.0–36.0)
MCV: 94.5 fL (ref 80.0–100.0)
MPV: 10.9 fL (ref 7.5–12.5)
Platelets: 315 K/uL (ref 140–400)
RBC: 4.02 MIL/uL (ref 3.80–5.10)
RDW: 13.2 % (ref 11.0–15.0)
WBC: 9.9 K/uL (ref 3.8–10.8)

## 2016-10-20 LAB — BASIC METABOLIC PANEL WITH GFR
BUN: 30 mg/dL — ABNORMAL HIGH (ref 7–25)
CHLORIDE: 106 mmol/L (ref 98–110)
CO2: 22 mmol/L (ref 20–31)
Calcium: 9 mg/dL (ref 8.6–10.4)
Creat: 1.38 mg/dL — ABNORMAL HIGH (ref 0.60–0.93)
GFR, EST AFRICAN AMERICAN: 45 mL/min — AB (ref >=60)
GFR, Est Non African American: 39 mL/min — ABNORMAL LOW (ref >=60)
GLUCOSE: 93 mg/dL (ref 65–99)
POTASSIUM: 4.6 mmol/L (ref 3.5–5.3)
Sodium: 137 mmol/L (ref 135–146)

## 2016-10-20 NOTE — Telephone Encounter (Signed)
Patient's husband called and states patient is having bilateral leg pain just as she did when her potassium was elevated. He is wanting to know if the patient can go to the lab to have labs checked to make sure this is not the case. He states the last time her postassium was so elevated she ended up having to be hospitalized. I asked him if she was having any swelling in her legs are pain tender to the touch or redness and he denies this.Please advise

## 2016-10-20 NOTE — Telephone Encounter (Signed)
Spouse notified of stat labs. Patient had mild swelling yesterday but not today. Again denies redness,tenderness,swelling.asked to schedule an appointement if any of these symptoms are present.

## 2016-10-20 NOTE — Telephone Encounter (Signed)
Ok to order STAT BMP with CBC with diff. Advise if swelling/redness to come be seen metheney has a 2:45 today.

## 2016-10-23 NOTE — Telephone Encounter (Signed)
Potassium great. Kidney function up a bit but not like 2 months ago. Find out how she is doing today.

## 2016-12-05 ENCOUNTER — Ambulatory Visit (INDEPENDENT_AMBULATORY_CARE_PROVIDER_SITE_OTHER): Payer: 59 | Admitting: Physician Assistant

## 2016-12-05 ENCOUNTER — Encounter: Payer: Self-pay | Admitting: Physician Assistant

## 2016-12-05 VITALS — BP 138/70 | HR 65 | Ht 62.0 in | Wt 117.0 lb

## 2016-12-05 DIAGNOSIS — J439 Emphysema, unspecified: Secondary | ICD-10-CM

## 2016-12-05 DIAGNOSIS — G629 Polyneuropathy, unspecified: Secondary | ICD-10-CM | POA: Diagnosis not present

## 2016-12-05 DIAGNOSIS — I701 Atherosclerosis of renal artery: Secondary | ICD-10-CM

## 2016-12-05 DIAGNOSIS — Z8639 Personal history of other endocrine, nutritional and metabolic disease: Secondary | ICD-10-CM | POA: Diagnosis not present

## 2016-12-05 DIAGNOSIS — I70213 Atherosclerosis of native arteries of extremities with intermittent claudication, bilateral legs: Secondary | ICD-10-CM

## 2016-12-05 DIAGNOSIS — R252 Cramp and spasm: Secondary | ICD-10-CM | POA: Diagnosis not present

## 2016-12-05 LAB — BASIC METABOLIC PANEL WITH GFR
BUN: 25 mg/dL (ref 7–25)
CHLORIDE: 105 mmol/L (ref 98–110)
CO2: 27 mmol/L (ref 20–31)
CREATININE: 1.48 mg/dL — AB (ref 0.60–0.93)
Calcium: 9.4 mg/dL (ref 8.6–10.4)
GFR, Est African American: 41 mL/min — ABNORMAL LOW (ref >=60)
GFR, Est Non African American: 36 mL/min — ABNORMAL LOW (ref >=60)
Glucose, Bld: 84 mg/dL (ref 65–99)
POTASSIUM: 5.5 mmol/L — AB (ref 3.5–5.3)
SODIUM: 139 mmol/L (ref 135–146)

## 2016-12-05 LAB — CBC WITH DIFFERENTIAL/PLATELET
BASOS PCT: 1 %
Basophils Absolute: 108 cells/uL (ref 0–200)
EOS ABS: 324 {cells}/uL (ref 15–500)
EOS PCT: 3 %
HCT: 41.3 % (ref 35.0–45.0)
Hemoglobin: 13.7 g/dL (ref 11.7–15.5)
LYMPHS ABS: 3456 {cells}/uL (ref 850–3900)
Lymphocytes Relative: 32 %
MCH: 31.6 pg (ref 27.0–33.0)
MCHC: 33.2 g/dL (ref 32.0–36.0)
MCV: 95.2 fL (ref 80.0–100.0)
MONO ABS: 648 {cells}/uL (ref 200–950)
MONOS PCT: 6 %
MPV: 10.5 fL (ref 7.5–12.5)
NEUTROS ABS: 6264 {cells}/uL (ref 1500–7800)
Neutrophils Relative %: 58 %
PLATELETS: 334 10*3/uL (ref 140–400)
RBC: 4.34 MIL/uL (ref 3.80–5.10)
RDW: 13.2 % (ref 11.0–15.0)
WBC: 10.8 10*3/uL (ref 3.8–10.8)

## 2016-12-05 LAB — MAGNESIUM: Magnesium: 1.9 mg/dL (ref 1.5–2.5)

## 2016-12-05 MED ORDER — PREDNISONE 50 MG PO TABS: ORAL_TABLET | ORAL | 0 refills | Status: DC

## 2016-12-05 NOTE — Progress Notes (Addendum)
Subjective:    Patient ID: Catherine Phillips, female    DOB: April 18, 1946, 71 y.o.   MRN: [id number]  HPI  Pt is a 71 yo female with PMH of PVD and intermittent claudication and hyperkalemia both of which have caused her muscle cramps. Her COPD has been controlled with symbicort and albuterol nebulizer. She comes in today with 1 week for bilateral muscle cramps/pain of bilateral cramps. She has seen Dr. Scot Dock for PVD and claudication and placed on lyrica and livalo. She has been on both for multiple years.  Stated she was not a candidate for stent. Pain did get better but for last week much worse. Pain worse with any exertion but has had some pain at rest. Last night pain woke her up in the middle of the night. She cannot take NSAIDs due to RAS and CKD. She has had no medication changes. She denies taking anything OTC other than tylenol. She was hospitalized for hypokalemia after reaction to bactrim and pain is similar. Pt denies any immobilization, travel, surgeries. She is not on any HRT.   Her pulse ox was decreased today but denies any cough. She denies that her SOB is no worse than it usually is. Denies any fever, chills, body aches.      Review of Systems  All other systems reviewed and are negative.      Objective:   Physical Exam  Constitutional: She is oriented to person, place, and time. She appears well-developed and well-nourished.  HENT:  Head: Normocephalic and atraumatic.  Cardiovascular: Normal rate, regular rhythm and normal heart sounds.   Pulmonary/Chest: Effort normal and breath sounds normal. She has no wheezes.  Neurological: She is alert and oriented to person, place, and time. No cranial nerve deficit.  Skin:  Calf symmetric in measurement 30cm.  No redness, warmth, swellling.  Tenderness to palpation over bilateral calfs minimal.           Assessment & Plan:   Marland KitchenMarland KitchenDiagnoses and all orders for this visit:  Bilateral leg cramps -     BASIC METABOLIC PANEL  WITH GFR -     Magnesium -     CBC with Differential/Platelet  History of hypokalemia -     BASIC METABOLIC PANEL WITH GFR -     Magnesium -     CBC with Differential/Platelet  Renal artery stenosis ([facility]) -     BASIC METABOLIC PANEL WITH GFR -     Magnesium -     CBC with Differential/Platelet  Peripheral polyneuropathy ([facility])  Pulmonary emphysema, unspecified emphysema type ([facility]) -     predniSONE (DELTASONE) 50 MG tablet; Take one tablet for 5 days.  Atherosclerosis of native artery of both lower extremities with intermittent claudication ([place])   Wells criteria is 1. Low risk. Pt has no risk factors or swelling.  Need to rule out hyperkalemia due to history. Ordered stat labs.  I suspect this is claudication worsening. We called Dr. Scot Dock and his office is trying to work her in.  Consider alka seltzer tablet for muscle cramps.  Written out of work for today.  HO given.  Stay on current medication and tylenol as needed. Due to CKD cannot take NSAIDs.   I am concerned for a COPD worsening and why pulse ox is dropping. No cough. Will treat with prednisone for 5 days.   Follow up as needed or in 1 week for follow up.   Addendum: March 27th U/S scheduled with vascular surgery and appt  on April 11th.

## 2016-12-05 NOTE — Progress Notes (Signed)
Call pt:  Kidney function has declined and potassium is up a bit 5.5 but not in worrisome range of above 6.   Need to start very low potassium diet. Recheck in 1 week.   Follow up with vascular and Dr. Mclean(nephrologist). Both within next week if possible.    Potassium could be result of kidney function decline.

## 2016-12-05 NOTE — Progress Notes (Signed)
Yes, I do not think it is causing this.

## 2016-12-05 NOTE — Patient Instructions (Signed)
Peripheral Vascular Disease Peripheral vascular disease (PVD) is a disease of the blood vessels that are not part of your heart and brain. A simple term for PVD is poor circulation. In most cases, PVD narrows the blood vessels that carry blood from your heart to the rest of your body. This can result in a decreased supply of blood to your arms, legs, and internal organs, like your stomach or kidneys. However, it most often affects a person's lower legs and feet. There are two types of PVD.  Organic PVD. This is the more common type. It is caused by damage to the structure of blood vessels.  Functional PVD. This is caused by conditions that make blood vessels contract and tighten (spasm).  Without treatment, PVD tends to get worse over time. PVD can also lead to acute ischemic limb. This is when an arm or limb suddenly has trouble getting enough blood. This is a medical emergency. What are the causes? Each type of PVD has many different causes. The most common cause of PVD is buildup of a fatty material (plaque) inside of your arteries (atherosclerosis). Small amounts of plaque can break off from the walls of the blood vessels and become lodged in a smaller artery. This blocks blood flow and can cause acute ischemic limb. Other common causes of PVD include:  Blood clots that form inside of blood vessels.  Injuries to blood vessels.  Diseases that cause inflammation of blood vessels or cause blood vessel spasms.  Health behaviors and health history that increase your risk of developing PVD.  What increases the risk? You may have a greater risk of PVD if you:  Have a family history of PVD.  Have certain medical conditions, including: ? High cholesterol. ? Diabetes. ? High blood pressure (hypertension). ? Coronary heart disease. ? Past problems with blood clots. ? Past injury, such as burns or a broken bone. These may have damaged blood vessels in your limbs. ? Buerger disease. This is  caused by inflamed blood vessels in your hands and feet. ? Some forms of arthritis. ? Rare birth defects that affect the arteries in your legs.  Use tobacco.  Do not get enough exercise.  Are obese.  Are age 50 or older.  What are the signs or symptoms? PVD may cause many different symptoms. Your symptoms depend on what part of your body is not getting enough blood. Some common signs and symptoms include:  Cramps in your lower legs. This may be a symptom of poor leg circulation (claudication).  Pain and weakness in your legs while you are physically active that goes away when you rest (intermittent claudication).  Leg pain when at rest.  Leg numbness, tingling, or weakness.  Coldness in a leg or foot, especially when compared with the other leg.  Skin or hair changes. These can include: ? Hair loss. ? Shiny skin. ? Pale or bluish skin. ? Thick toenails.  Inability to get or maintain an erection (erectile dysfunction).  People with PVD are more prone to developing ulcers and sores on their toes, feet, or legs. These may take longer than normal to heal. How is this diagnosed? Your health care provider may diagnose PVD from your signs and symptoms. The health care provider will also do a physical exam. You may have tests to find out what is causing your PVD and determine its severity. Tests may include:  Blood pressure recordings from your arms and legs and measurements of the strength of your pulses (  pulse volume recordings).  Imaging studies using sound waves to take pictures of the blood flow through your blood vessels (Doppler ultrasound).  Injecting a dye into your blood vessels before having imaging studies using: ? X-rays (angiogram or arteriogram). ? Computer-generated X-rays (CT angiogram). ? A powerful electromagnetic field and a computer (magnetic resonance angiogram or MRA).  How is this treated? Treatment for PVD depends on the cause of your condition and the  severity of your symptoms. It also depends on your age. Underlying causes need to be treated and controlled. These include long-lasting (chronic) conditions, such as diabetes, high cholesterol, and high blood pressure. You may need to first try making lifestyle changes and taking medicines. Surgery may be needed if these do not work. Lifestyle changes may include:  Quitting smoking.  Exercising regularly.  Following a low-fat, low-cholesterol diet.  Medicines may include:  Blood thinners to prevent blood clots.  Medicines to improve blood flow.  Medicines to improve your blood cholesterol levels.  Surgical procedures may include:  A procedure that uses an inflated balloon to open a blocked artery and improve blood flow (angioplasty).  A procedure to put in a tube (stent) to keep a blocked artery open (stent implant).  Surgery to reroute blood flow around a blocked artery (peripheral bypass surgery).  Surgery to remove dead tissue from an infected wound on the affected limb.  Amputation. This is surgical removal of the affected limb. This may be necessary in cases of acute ischemic limb that are not improved through medical or surgical treatments.  Follow these instructions at home:  Take medicines only as directed by your health care provider.  Do not use any tobacco products, including cigarettes, chewing tobacco, or electronic cigarettes. If you need help quitting, ask your health care provider.  Lose weight if you are overweight, and maintain a healthy weight as directed by your health care provider.  Eat a diet that is low in fat and cholesterol. If you need help, ask your health care provider.  Exercise regularly. Ask your health care provider to suggest some good activities for you.  Use compression stockings or other mechanical devices as directed by your health care provider.  Take good care of your feet. ? Wear comfortable shoes that fit well. ? Check your feet  often for any cuts or sores. Contact a health care provider if:  You have cramps in your legs while walking.  You have leg pain when you are at rest.  You have coldness in a leg or foot.  Your skin changes.  You have erectile dysfunction.  You have cuts or sores on your feet that are not healing. Get help right away if:  Your arm or leg turns cold and blue.  Your arms or legs become red, warm, swollen, painful, or numb.  You have chest pain or trouble breathing.  You suddenly have weakness in your face, arm, or leg.  You become very confused or lose the ability to speak.  You suddenly have a very bad headache or lose your vision. This information is not intended to replace advice given to you by your health care provider. Make sure you discuss any questions you have with your health care provider. Document Released: 10/12/2004 Document Revised: 02/10/2016 Document Reviewed: 02/12/2014 Elsevier Interactive Patient Education  2017 [employer]  

## 2016-12-08 ENCOUNTER — Other Ambulatory Visit: Payer: Self-pay

## 2016-12-08 DIAGNOSIS — I739 Peripheral vascular disease, unspecified: Secondary | ICD-10-CM

## 2016-12-12 ENCOUNTER — Ambulatory Visit (HOSPITAL_COMMUNITY)
Admission: RE | Admit: 2016-12-12 | Discharge: 2016-12-12 | Disposition: A | Discharge status: Home / Self Care | Payer: 59 | Source: Ambulatory Visit | Attending: Vascular Surgery | Admitting: Vascular Surgery

## 2016-12-12 DIAGNOSIS — R9439 Abnormal result of other cardiovascular function study: Secondary | ICD-10-CM | POA: Diagnosis not present

## 2016-12-12 DIAGNOSIS — I739 Peripheral vascular disease, unspecified: Secondary | ICD-10-CM | POA: Diagnosis not present

## 2016-12-14 ENCOUNTER — Telehealth: Payer: Self-pay | Admitting: *Deleted

## 2016-12-14 DIAGNOSIS — E876 Hypokalemia: Secondary | ICD-10-CM

## 2016-12-14 LAB — BASIC METABOLIC PANEL WITH GFR
BUN: 26 mg/dL — ABNORMAL HIGH (ref 7–25)
CHLORIDE: 104 mmol/L (ref 98–110)
CO2: 23 mmol/L (ref 20–31)
Calcium: 9.5 mg/dL (ref 8.6–10.4)
Creat: 1.43 mg/dL — ABNORMAL HIGH (ref 0.60–0.93)
GFR, EST NON AFRICAN AMERICAN: 37 mL/min — AB (ref >=60)
GFR, Est African American: 43 mL/min — ABNORMAL LOW (ref >=60)
GLUCOSE: 92 mg/dL (ref 65–99)
POTASSIUM: 4.8 mmol/L (ref 3.5–5.3)
SODIUM: 140 mmol/L (ref 135–146)

## 2016-12-14 NOTE — Telephone Encounter (Signed)
Labs ordered to recheck potassium level.

## 2016-12-18 ENCOUNTER — Encounter: Payer: Self-pay | Admitting: Osteopathic Medicine

## 2016-12-18 ENCOUNTER — Ambulatory Visit (INDEPENDENT_AMBULATORY_CARE_PROVIDER_SITE_OTHER): Payer: 59 | Admitting: Osteopathic Medicine

## 2016-12-18 VITALS — BP 157/84 | HR 84 | Temp 98.1°F | Ht 62.0 in | Wt 116.0 lb

## 2016-12-18 DIAGNOSIS — J441 Chronic obstructive pulmonary disease with (acute) exacerbation: Secondary | ICD-10-CM

## 2016-12-18 DIAGNOSIS — I10 Essential (primary) hypertension: Secondary | ICD-10-CM | POA: Diagnosis not present

## 2016-12-18 DIAGNOSIS — Z8639 Personal history of other endocrine, nutritional and metabolic disease: Secondary | ICD-10-CM

## 2016-12-18 DIAGNOSIS — J439 Emphysema, unspecified: Secondary | ICD-10-CM | POA: Diagnosis not present

## 2016-12-18 DIAGNOSIS — I701 Atherosclerosis of renal artery: Secondary | ICD-10-CM

## 2016-12-18 MED ORDER — GUAIFENESIN 400 MG PO TABS: 400.0000 mg | ORAL_TABLET | Freq: Four times a day (QID) | ORAL | 0 refills | Status: DC | PRN

## 2016-12-18 MED ORDER — PREDNISONE 50 MG PO TABS: ORAL_TABLET | ORAL | 0 refills | Status: DC

## 2016-12-18 NOTE — Telephone Encounter (Signed)
Call pt: kidney function is better and potassium in normal range.

## 2016-12-18 NOTE — Progress Notes (Signed)
HPI: Catherine Phillips is a 71 y.o. female  who presents to [facility] today, 12/18/16,  for chief complaint of:  Chief Complaint  Patient presents with  . Cough    Cough ongoing for about 4 days, intermittent. History of COPD/emphysema. Typically will get a course of prednisone for emphysema exacerbation and does much better with this. No shortness of breath. No fever/chills. Occasional productive cough. Has not tried any over-the-counter medication for this issue. Is using inhaled medications as below.  Potassium concern: Have this checked last week, was elevated but one sooner what most recent level was.  Hypertension: No chest pain, pressure, shortness of breath. Blood pressure slightly elevated today, no home blood pressures to report  Past medical history, surgical history, social history and family history reviewed.  Patient Active Problem List   Diagnosis Date Noted  . Renal artery stenosis ([place]) 06/21/2016  . Medication reaction 05/31/2016  . Hyperkalemia 05/22/2016  . Epigastric abdominal pain 04/25/2016  . COPD (chronic obstructive pulmonary disease) ([place]) 05/02/2015  . Leukocytosis 05/02/2015  . SOB (shortness of breath) 05/02/2015  . Dehydration 05/02/2015  . AKI (acute kidney injury) ([place]) 05/02/2015  . Osteoarthritis, hand 04/19/2015  . Gastritis 12/03/2014  . Pulmonary nodules 04/28/2014  . Peripheral neuropathy ([place]) 10/28/2013  . COPD (chronic obstructive pulmonary disease) with emphysema Gold B 08/22/2013  . Peripheral vascular disease ([place]) 01/22/2013  . Pain in limb 12/04/2012  . Atherosclerosis of native artery of extremity with intermittent claudication ([place]) 12/04/2012  . GERD (gastroesophageal reflux disease) 10/04/2012  . Osteoporosis 09/04/2012  . Hyperlipidemia LDL goal <70 06/26/2012  . History of Left leg claudication 06/24/2012  . Pulmonary emphysema ([place]) 06/24/2012  . Essential hypertension 06/24/2012  . Anxiety  06/24/2012    Current medication list and allergy/intolerance information reviewed.   Current Outpatient Prescriptions on File Prior to Visit  Medication Sig Dispense Refill  . albuterol (PROVENTIL HFA;VENTOLIN HFA) 108 (90 Base) MCG/ACT inhaler Inhale 2 puffs into the lungs every 4 (four) hours as needed for wheezing or shortness of breath. 1 Inhaler 11  . albuterol (PROVENTIL) (2.5 MG/3ML) 0.083% nebulizer solution Take 3 mLs (2.5 mg total) by nebulization every 6 (six) hours as needed for wheezing or shortness of breath. 75 mL 1  . amLODipine (NORVASC) 5 MG tablet Take 5 mg by mouth daily.    . budesonide-formoterol (SYMBICORT) 160-4.5 MCG/ACT inhaler Inhale 2 puffs into the lungs 2 (two) times daily. (Patient taking differently: Inhale 2 puffs into the lungs 2 (two) times daily as needed (shortness of breath/ wheezing). ) 1 Inhaler 0  . labetalol (NORMODYNE) 100 MG tablet     . Omega-3 Fatty Acids (FISH OIL PO) Take 1 capsule by mouth daily.    . pantoprazole (PROTONIX) 40 MG tablet Take 2 tablets (80 mg total) by mouth daily. (Patient taking differently: Take 40 mg by mouth 2 (two) times daily before a meal. ) 180 tablet 1  . Pitavastatin Calcium (LIVALO) 4 MG TABS Take 1 tablet (4 mg total) by mouth every other day. 90 tablet 3  . predniSONE (DELTASONE) 50 MG tablet Take one tablet for 5 days. 5 tablet 0  . pregabalin (LYRICA) 50 MG capsule TAKE ONE CAPSULE 3 TIMES A DAY 270 capsule 1   No current facility-administered medications on file prior to visit.    Allergies  Allergen Reactions  . Bactrim [Sulfamethoxazole-Trimethoprim] Other (See Comments)    hyperkalemia  . Codeine Nausea And Vomiting  . Levaquin [  Levofloxacin In D5w] Itching and Rash      Review of Systems:  Constitutional: +recent illness  HEENT: No  headache, no vision change, + sinus congestion  Cardiac: No  chest pain, No  pressure, No palpitations  Respiratory:  No  shortness of breath.  +Cough  Gastrointestinal: No  abdominal pain, no change on bowel habits  Neurologic: No  weakness, No  Dizziness   Exam:  BP (!) 157/84   Pulse 84   Temp 98.1 F (36.7 C) (Oral)   Ht 5\' 2"  (1.575 m)   Wt 116 lb (52.6 kg)   SpO2 98%   BMI 21.22 kg/m   Constitutional: VS see above. General Appearance: alert, well-developed, well-nourished, NAD  Eyes: Normal lids and conjunctive, non-icteric sclera  Ears, Nose, Mouth, Throat: MMM, Normal external inspection ears/nares/mouth/lips/gums. Normal nasal mucosa.  Neck: No masses, trachea midline.   Respiratory: Normal respiratory effort. no wheeze, no rhonchi, no rales. Significantly diminished breath sounds bilaterally  Cardiovascular: S1/S2 normal, no murmur, no rub/gallop auscultated. RRR.   Musculoskeletal: Gait normal. Symmetric and independent movement of all extremities  Neurological: Normal balance/coordination. No tremor.  Skin: warm, dry, intact.   Psychiatric: Normal judgment/insight. Normal mood and affect. Oriented x3.    Labs reviewed: Most recent potassium check was in normal range.  ASSESSMENT/PLAN:   Continue Symbicort twice a day with as needed use of albuterol. 5 day burst of prednisone, cough expectorant. ER/RTC precautions reviewed, low clinical suspicion for pneumonia or bronchitis which would require antibiotic use at this time.  COPD exacerbation (HCC) - Plan: predniSONE (DELTASONE) 50 MG tablet, guaifenesin (HUMIBID E) 400 MG TABS tablet  Pulmonary emphysema, unspecified emphysema type ([place])  Essential hypertension - Recheck when well  Hx of hyperkalemia - Resolved as of last week, follow-up with PCP as directed    Follow-up plan: Return if symptoms worsen or fail to improve.  Visit summary with medication list and pertinent instructions was printed for patient to review, alert [place] if any changes needed. All questions at time of visit were answered - patient instructed to contact office with any  additional concerns. ER/RTC precautions were reviewed with the patient and understanding verbalized.

## 2016-12-19 ENCOUNTER — Encounter: Payer: Self-pay | Admitting: Vascular Surgery

## 2016-12-23 ENCOUNTER — Encounter (HOSPITAL_BASED_OUTPATIENT_CLINIC_OR_DEPARTMENT_OTHER): Payer: Self-pay | Admitting: *Deleted

## 2016-12-23 ENCOUNTER — Emergency Department (HOSPITAL_BASED_OUTPATIENT_CLINIC_OR_DEPARTMENT_OTHER): Payer: Medicare Other

## 2016-12-23 ENCOUNTER — Inpatient Hospital Stay (HOSPITAL_BASED_OUTPATIENT_CLINIC_OR_DEPARTMENT_OTHER)
Admission: EM | Admit: 2016-12-23 | Discharge: 2016-12-25 | DRG: 190 | Disposition: A | Discharge status: Home / Self Care | Payer: Medicare Other | Attending: Internal Medicine | Admitting: Internal Medicine

## 2016-12-23 DIAGNOSIS — Z885 Allergy status to narcotic agent status: Secondary | ICD-10-CM

## 2016-12-23 DIAGNOSIS — M810 Age-related osteoporosis without current pathological fracture: Secondary | ICD-10-CM | POA: Diagnosis present

## 2016-12-23 DIAGNOSIS — J9621 Acute and chronic respiratory failure with hypoxia: Secondary | ICD-10-CM

## 2016-12-23 DIAGNOSIS — Z8249 Family history of ischemic heart disease and other diseases of the circulatory system: Secondary | ICD-10-CM

## 2016-12-23 DIAGNOSIS — N183 Chronic kidney disease, stage 3 (moderate): Secondary | ICD-10-CM

## 2016-12-23 DIAGNOSIS — Z79899 Other long term (current) drug therapy: Secondary | ICD-10-CM

## 2016-12-23 DIAGNOSIS — J441 Chronic obstructive pulmonary disease with (acute) exacerbation: Secondary | ICD-10-CM | POA: Diagnosis not present

## 2016-12-23 DIAGNOSIS — M19049 Primary osteoarthritis, unspecified hand: Secondary | ICD-10-CM | POA: Diagnosis present

## 2016-12-23 DIAGNOSIS — D72829 Elevated white blood cell count, unspecified: Secondary | ICD-10-CM | POA: Diagnosis present

## 2016-12-23 DIAGNOSIS — I129 Hypertensive chronic kidney disease with stage 1 through stage 4 chronic kidney disease, or unspecified chronic kidney disease: Secondary | ICD-10-CM | POA: Diagnosis not present

## 2016-12-23 DIAGNOSIS — T380X5A Adverse effect of glucocorticoids and synthetic analogues, initial encounter: Secondary | ICD-10-CM | POA: Diagnosis present

## 2016-12-23 DIAGNOSIS — I10 Essential (primary) hypertension: Secondary | ICD-10-CM | POA: Diagnosis not present

## 2016-12-23 DIAGNOSIS — Y92019 Unspecified place in single-family (private) house as the place of occurrence of the external cause: Secondary | ICD-10-CM

## 2016-12-23 DIAGNOSIS — G629 Polyneuropathy, unspecified: Secondary | ICD-10-CM | POA: Diagnosis present

## 2016-12-23 DIAGNOSIS — Z87891 Personal history of nicotine dependence: Secondary | ICD-10-CM

## 2016-12-23 DIAGNOSIS — Z7951 Long term (current) use of inhaled steroids: Secondary | ICD-10-CM

## 2016-12-23 DIAGNOSIS — J069 Acute upper respiratory infection, unspecified: Secondary | ICD-10-CM | POA: Diagnosis present

## 2016-12-23 DIAGNOSIS — K219 Gastro-esophageal reflux disease without esophagitis: Secondary | ICD-10-CM | POA: Diagnosis present

## 2016-12-23 DIAGNOSIS — Z881 Allergy status to other antibiotic agents status: Secondary | ICD-10-CM

## 2016-12-23 DIAGNOSIS — E785 Hyperlipidemia, unspecified: Secondary | ICD-10-CM | POA: Diagnosis present

## 2016-12-23 DIAGNOSIS — J9601 Acute respiratory failure with hypoxia: Secondary | ICD-10-CM | POA: Diagnosis not present

## 2016-12-23 DIAGNOSIS — I70219 Atherosclerosis of native arteries of extremities with intermittent claudication, unspecified extremity: Secondary | ICD-10-CM | POA: Diagnosis present

## 2016-12-23 HISTORY — DX: Disorder of kidney and ureter, unspecified: N28.9

## 2016-12-23 LAB — BASIC METABOLIC PANEL
Anion gap: 8 (ref 5–15)
BUN: 31 mg/dL — AB (ref 6–20)
CO2: 25 mmol/L (ref 22–32)
CREATININE: 1.17 mg/dL — AB (ref 0.44–1.00)
Calcium: 9.2 mg/dL (ref 8.9–10.3)
Chloride: 103 mmol/L (ref 101–111)
GFR calc Af Amer: 53 mL/min — ABNORMAL LOW (ref >60)
GFR, EST NON AFRICAN AMERICAN: 46 mL/min — AB (ref >60)
GLUCOSE: 100 mg/dL — AB (ref 65–99)
Potassium: 3.8 mmol/L (ref 3.5–5.1)
SODIUM: 136 mmol/L (ref 135–145)

## 2016-12-23 LAB — CBC
HCT: 40.2 % (ref 36.0–46.0)
Hemoglobin: 13.1 g/dL (ref 12.0–15.0)
MCH: 31.3 pg (ref 26.0–34.0)
MCHC: 32.6 g/dL (ref 30.0–36.0)
MCV: 95.9 fL (ref 78.0–100.0)
PLATELETS: 281 10*3/uL (ref 150–400)
RBC: 4.19 MIL/uL (ref 3.87–5.11)
RDW: 13.4 % (ref 11.5–15.5)
WBC: 21.4 10*3/uL — ABNORMAL HIGH (ref 4.0–10.5)

## 2016-12-23 LAB — PROCALCITONIN: Procalcitonin: <0.1 ng/mL

## 2016-12-23 MED ORDER — IPRATROPIUM BROMIDE 0.02 % IN SOLN
0.5000 mg | Freq: Once | RESPIRATORY_TRACT | Status: AC
  Administered 2016-12-23: 0.5 mg via RESPIRATORY_TRACT
  Filled 2016-12-23: qty 2.5

## 2016-12-23 MED ORDER — PREGABALIN 50 MG PO CAPS
50.0000 mg | ORAL_CAPSULE | Freq: Every day | ORAL | Status: DC
  Administered 2016-12-24 – 2016-12-25 (×2): 50 mg via ORAL
  Filled 2016-12-23 (×2): qty 1

## 2016-12-23 MED ORDER — AMLODIPINE BESYLATE 5 MG PO TABS
5.0000 mg | ORAL_TABLET | Freq: Every day | ORAL | Status: DC
  Administered 2016-12-24 – 2016-12-25 (×2): 5 mg via ORAL
  Filled 2016-12-23 (×2): qty 1

## 2016-12-23 MED ORDER — ALBUTEROL SULFATE (2.5 MG/3ML) 0.083% IN NEBU
2.5000 mg | INHALATION_SOLUTION | Freq: Once | RESPIRATORY_TRACT | Status: AC
  Administered 2016-12-23: 2.5 mg via RESPIRATORY_TRACT

## 2016-12-23 MED ORDER — METHYLPREDNISOLONE SODIUM SUCC 125 MG IJ SOLR
125.0000 mg | Freq: Once | INTRAMUSCULAR | Status: AC
  Administered 2016-12-23: 125 mg via INTRAVENOUS
  Filled 2016-12-23: qty 2

## 2016-12-23 MED ORDER — IPRATROPIUM-ALBUTEROL 0.5-2.5 (3) MG/3ML IN SOLN
3.0000 mL | Freq: Four times a day (QID) | RESPIRATORY_TRACT | Status: DC
  Administered 2016-12-24 (×4): 3 mL via RESPIRATORY_TRACT
  Filled 2016-12-23 (×4): qty 3

## 2016-12-23 MED ORDER — IPRATROPIUM-ALBUTEROL 0.5-2.5 (3) MG/3ML IN SOLN
3.0000 mL | Freq: Once | RESPIRATORY_TRACT | Status: AC
  Administered 2016-12-23: 3 mL via RESPIRATORY_TRACT

## 2016-12-23 MED ORDER — ACETAMINOPHEN 500 MG PO TABS: 1000.0000 mg | ORAL_TABLET | Freq: Four times a day (QID) | ORAL | Status: DC | PRN

## 2016-12-23 MED ORDER — LABETALOL HCL 100 MG PO TABS
100.0000 mg | ORAL_TABLET | Freq: Two times a day (BID) | ORAL | Status: DC
  Administered 2016-12-23 – 2016-12-25 (×4): 100 mg via ORAL
  Filled 2016-12-23 (×4): qty 1

## 2016-12-23 MED ORDER — ALBUTEROL SULFATE (2.5 MG/3ML) 0.083% IN NEBU
INHALATION_SOLUTION | RESPIRATORY_TRACT | Status: AC
  Administered 2016-12-23: 2.5 mg via RESPIRATORY_TRACT
  Filled 2016-12-23: qty 3

## 2016-12-23 MED ORDER — ALBUTEROL SULFATE (2.5 MG/3ML) 0.083% IN NEBU
5.0000 mg | INHALATION_SOLUTION | Freq: Once | RESPIRATORY_TRACT | Status: AC
  Administered 2016-12-23: 5 mg via RESPIRATORY_TRACT
  Filled 2016-12-23: qty 6

## 2016-12-23 MED ORDER — BUDESONIDE 0.25 MG/2ML IN SUSP
0.2500 mg | Freq: Two times a day (BID) | RESPIRATORY_TRACT | Status: DC
  Administered 2016-12-23 – 2016-12-25 (×4): 0.25 mg via RESPIRATORY_TRACT
  Filled 2016-12-23 (×4): qty 2

## 2016-12-23 MED ORDER — ENOXAPARIN SODIUM 40 MG/0.4ML ~~LOC~~ SOLN
40.0000 mg | SUBCUTANEOUS | Status: DC
  Administered 2016-12-23: 40 mg via SUBCUTANEOUS
  Filled 2016-12-23: qty 0.4

## 2016-12-23 MED ORDER — METHYLPREDNISOLONE SODIUM SUCC 125 MG IJ SOLR
60.0000 mg | Freq: Two times a day (BID) | INTRAMUSCULAR | Status: DC
  Administered 2016-12-23 – 2016-12-25 (×4): 60 mg via INTRAVENOUS
  Filled 2016-12-23 (×4): qty 2

## 2016-12-23 MED ORDER — ORAL CARE MOUTH RINSE: 15.0000 mL | Freq: Two times a day (BID) | OROMUCOSAL | Status: DC

## 2016-12-23 MED ORDER — ARFORMOTEROL TARTRATE 15 MCG/2ML IN NEBU
15.0000 ug | INHALATION_SOLUTION | Freq: Two times a day (BID) | RESPIRATORY_TRACT | Status: DC
  Administered 2016-12-23 – 2016-12-25 (×4): 15 ug via RESPIRATORY_TRACT
  Filled 2016-12-23 (×4): qty 2

## 2016-12-23 MED ORDER — AZITHROMYCIN 500 MG IV SOLR
INTRAVENOUS | Status: AC
  Filled 2016-12-23: qty 500

## 2016-12-23 MED ORDER — ALBUTEROL SULFATE (2.5 MG/3ML) 0.083% IN NEBU: 2.5000 mg | INHALATION_SOLUTION | RESPIRATORY_TRACT | Status: DC | PRN

## 2016-12-23 MED ORDER — DEXTROSE 5 % IV SOLN
500.0000 mg | Freq: Once | INTRAVENOUS | Status: AC
  Filled 2016-12-23: qty 500

## 2016-12-23 MED ORDER — PRAVASTATIN SODIUM 20 MG PO TABS
80.0000 mg | ORAL_TABLET | Freq: Every day | ORAL | Status: DC
  Administered 2016-12-24: 80 mg via ORAL
  Filled 2016-12-23 (×2): qty 4

## 2016-12-23 MED ORDER — IPRATROPIUM-ALBUTEROL 0.5-2.5 (3) MG/3ML IN SOLN
RESPIRATORY_TRACT | Status: AC
  Administered 2016-12-23: 3 mL via RESPIRATORY_TRACT
  Filled 2016-12-23: qty 3

## 2016-12-23 MED ORDER — LABETALOL HCL 100 MG PO TABS: 100.0000 mg | ORAL_TABLET | Freq: Every day | ORAL | Status: DC

## 2016-12-23 MED ORDER — AZITHROMYCIN 250 MG PO TABS
250.0000 mg | ORAL_TABLET | Freq: Every day | ORAL | Status: DC
  Administered 2016-12-24 – 2016-12-25 (×2): 250 mg via ORAL
  Filled 2016-12-23 (×2): qty 1

## 2016-12-23 MED ORDER — PANTOPRAZOLE SODIUM 40 MG PO TBEC
40.0000 mg | DELAYED_RELEASE_TABLET | Freq: Two times a day (BID) | ORAL | Status: DC
  Administered 2016-12-24 – 2016-12-25 (×2): 40 mg via ORAL
  Filled 2016-12-23 (×3): qty 1

## 2016-12-23 MED ORDER — DEXTROSE 5 % IV SOLN
500.0000 mg | Freq: Once | INTRAVENOUS | Status: AC
  Administered 2016-12-23: 500 mg via INTRAVENOUS

## 2016-12-23 MED ORDER — IPRATROPIUM-ALBUTEROL 0.5-2.5 (3) MG/3ML IN SOLN
3.0000 mL | RESPIRATORY_TRACT | Status: DC
  Administered 2016-12-23: 3 mL via RESPIRATORY_TRACT
  Filled 2016-12-23: qty 3

## 2016-12-23 MED ORDER — ONDANSETRON HCL 4 MG PO TABS: 4.0000 mg | ORAL_TABLET | Freq: Four times a day (QID) | ORAL | Status: DC | PRN

## 2016-12-23 MED ORDER — ONDANSETRON HCL 4 MG/2ML IJ SOLN: 4.0000 mg | Freq: Four times a day (QID) | INTRAMUSCULAR | Status: DC | PRN

## 2016-12-23 NOTE — ED Triage Notes (Addendum)
Patient states she has had upper respiratory congestion, drainage and a congested non productive cough.  States the sinus drainage was clear and now is yellow.  States this morning she woke up at 0400 with coughing and developed a severe pain in the right flank chest area.  States she was seen by her PCP six days ago and was given a daily steroid to take, which she has completed.  States she is not getting any better and feels worse now.

## 2016-12-23 NOTE — Progress Notes (Signed)
[facility]  Direct Admit : Dr Canary Brim 71 y/o female with COPD presents with cough & flu like symptoms. Finished a 6 day Prednisone taper yesterday. Pulse ox 79% on room air. Given Solumedrol and 3 Neb treatments.  Subsequent pulse ox 80% on room air.  EKG- sinus rhythm @ 70. Will admit to Med/sugr for acute resp failure. Have asked that she be given a dose of Zithromax.   Please call Flow manager at [phone] when she arrives at [facility].   Debbe Odea, MD

## 2016-12-23 NOTE — ED Provider Notes (Signed)
[facility] DEPT [facility] Provider Note   CSN: [id number] Arrival date & time: 12/23/16  1607     History   Chief Complaint Chief Complaint  Patient presents with  . URI    HPI Catherine Phillips is a 71 y.o. female.  HPI  Pt with hx of COPD presenting with c/o shortness of breath and worsening cough despite being on 5 day course of prednisone this week.  She saw her doctor 5 days ago for cough and congestion and was started on prednisone and was given cough medication.  She finished steroid yesterday.  Last night cough increased, this morning on arrival to the ED O2 sat was 79%, does not use O2 at home.  No chest pain- did have some right posterior rib pain- worse with coughing.  No fever/chills.  No vomiting.  She has been using her nebulizer twice daily at home.   There are no other associated systemic symptoms, there are no other alleviating or modifying factors.   Past Medical History:  Diagnosis Date  . Anxiety 06/24/2012  . COPD (chronic obstructive pulmonary disease) ([place])   . Diverticulosis   . Emphysema 06/24/2012  . Emphysema of lung ([place])   . Essential hypertension 06/24/2012  . History of Left leg claudication 06/24/2012  . Hypertension   . Renal disorder    Renal Insufficiency R=30%  . Shortness of breath dyspnea     Patient Active Problem List   Diagnosis Date Noted  . Acute respiratory failure with hypoxia ([place]) 12/23/2016  . Renal artery stenosis ([place]) 06/21/2016  . Medication reaction 05/31/2016  . Hyperkalemia 05/22/2016  . Epigastric abdominal pain 04/25/2016  . COPD (chronic obstructive pulmonary disease) ([place]) 05/02/2015  . Leukocytosis 05/02/2015  . SOB (shortness of breath) 05/02/2015  . Dehydration 05/02/2015  . AKI (acute kidney injury) ([place]) 05/02/2015  . Osteoarthritis, hand 04/19/2015  . Gastritis 12/03/2014  . Pulmonary nodules 04/28/2014  . Peripheral neuropathy ([place]) 10/28/2013  . COPD (chronic obstructive pulmonary disease) with emphysema [place] 08/22/2013  . Peripheral vascular disease ([place]) 01/22/2013  . Pain in limb 12/04/2012  . Atherosclerosis of native artery of extremity with intermittent claudication ([place]) 12/04/2012  . GERD (gastroesophageal reflux disease) 10/04/2012  . Osteoporosis 09/04/2012  . Hyperlipidemia LDL goal <70 06/26/2012  . History of Left leg claudication 06/24/2012  . Pulmonary emphysema ([place]) 06/24/2012  . Essential hypertension 06/24/2012  . Anxiety 06/24/2012    Past Surgical History:  Procedure Laterality Date  . ABDOMINAL AORTAGRAM  12/09/12  . ABDOMINAL AORTAGRAM N/A 12/09/2012   Procedure: ABDOMINAL Maxcine Ham;  Surgeon: Angelia Mould, MD;  Location: [facility] CATH LAB;  Service: Cardiovascular;  Laterality: N/A;    OB History    No data available       Home Medications    Prior to Admission medications   Medication Sig Start Date End Date Taking? Authorizing Provider  albuterol (PROVENTIL HFA;VENTOLIN HFA) 108 (90 Base) MCG/ACT inhaler Inhale 2 puffs into the lungs every 4 (four) hours as needed for wheezing or shortness of breath. 05/16/16  Yes Sean Hommel, DO  albuterol (PROVENTIL) (2.5 MG/3ML) 0.083% nebulizer solution Take 3 mLs (2.5 mg total) by nebulization every 6 (six) hours as needed for wheezing or shortness of breath. 05/31/16  Yes Jade L Breeback, PA-C  amLODipine (NORVASC) 5 MG tablet Take 5 mg by mouth daily.   Yes Historical Provider, MD  budesonide-formoterol (SYMBICORT) 160-4.5 MCG/ACT inhaler Inhale 2 puffs into the lungs 2 (two) times daily. Patient taking  differently: Inhale 2 puffs into the lungs 2 (two) times daily as needed (shortness of breath/ wheezing).  04/12/16  Yes Sean Hommel, DO  guaifenesin (HUMIBID E) 400 MG TABS tablet Take 1 tablet (400 mg total) by mouth every 6 (six) hours as needed (cough). 12/18/16  Yes Emeterio Reeve, DO  labetalol (NORMODYNE) 100 MG tablet  11/02/16  Yes Historical Provider, MD  Omega-3 Fatty Acids (FISH OIL PO) Take 1 capsule by  mouth daily.   Yes Historical Provider, MD  Pitavastatin Calcium (LIVALO) 4 MG TABS Take 1 tablet (4 mg total) by mouth every other day. 10/03/16  Yes Jade L Breeback, PA-C  pregabalin (LYRICA) 50 MG capsule TAKE ONE CAPSULE 3 TIMES A DAY 07/12/16  Yes Jade L Breeback, PA-C  pantoprazole (PROTONIX) 40 MG tablet Take 2 tablets (80 mg total) by mouth daily. Patient taking differently: Take 40 mg by mouth 2 (two) times daily before a meal.  04/12/16   Sean Hommel, DO  predniSONE (DELTASONE) 50 MG tablet Take one tablet for 5 days. 12/18/16   Emeterio Reeve, DO    Family History Family History  Problem Relation Age of Onset  . Cancer Mother   . Heart attack Father   . Deep vein thrombosis Father   . Hyperlipidemia Father   . Hypertension Father     Social History Social History  Substance Use Topics  . Smoking status: Former Smoker    Packs/day: 1.50    Years: 30.00    Types: Cigarettes    Quit date: 12/30/2009  . Smokeless tobacco: Never Used  . Alcohol use No     Allergies   Bactrim [sulfamethoxazole-trimethoprim]; Codeine; and Levaquin [levofloxacin in d5w]   Review of Systems Review of Systems  ROS reviewed and all otherwise negative except for mentioned in HPI   Physical Exam Updated Vital Signs BP (!) 146/56 (BP Location: Left Arm)   Pulse 76   Resp 20   SpO2 90%  Vitals reviewed Physical Exam Physical Examination: General appearance - alert, well appearing, and in no distress Mental status - alert, oriented to person, place, and time Eyes - no conjunctival injection no scleral icterus Mouth - mucous membranes moist, pharynx normal without lesions Neck - supple, no significant adenopathy Chest - decreased air movement throught with expiratory wheezing, BSS, increased respiratory effort Heart - normal rate, regular rhythm, normal S1, S2, no murmurs, rubs, clicks or gallops Abdomen - soft, nontender, nondistended, no masses or organomegaly Neurological - alert,  oriented, normal speech,  Extremities - peripheral pulses normal, no pedal edema, no clubbing or cyanosis Skin - normal coloration and turgor, no rashes,  ED Treatments / Results  Labs (all labs ordered are listed, but only abnormal results are displayed) Labs Reviewed  CBC - Abnormal; Notable for the following:       Result Value   WBC 21.4 (*)    All other components within normal limits  BASIC METABOLIC PANEL - Abnormal; Notable for the following:    Glucose, Bld 100 (*)    BUN 31 (*)    Creatinine, Ser 1.17 (*)    GFR calc non Af Amer 46 (*)    GFR calc Af Amer 53 (*)    All other components within normal limits    EKG  EKG Interpretation None       Radiology Dg Chest 2 View  Result Date: 12/23/2016 CLINICAL DATA:  Nonproductive cough for 5 days. Right lower chest pain starting this morning. History of  COPD, hypertension. EXAM: CHEST  2 VIEW COMPARISON:  Chest x-ray dated 10/04/2015. FINDINGS: Heart size and mediastinal contours are within normal limits. Lungs are hyperexpanded compatible with history of COPD. Coarse lung markings indicate associated chronic interstitial lung disease. Suspect associated chronic bronchitic changes centrally. No evidence of acute pulmonary abnormality. No pleural effusion or pneumothorax seen. No acute or suspicious osseous finding. IMPRESSION: 1. No active cardiopulmonary disease. No evidence of pneumonia or pulmonary edema. 2. COPD/emphysema, chronic interstitial lung disease bilaterally and suspected chronic bronchitic changes centrally. Electronically Signed   By: Franki Cabot M.D.   On: 12/23/2016 11:00    Procedures Procedures (including critical care time)  Medications Ordered in ED Medications  azithromycin (ZITHROMAX) 500 mg in dextrose 5 % 250 mL IVPB (not administered)  ipratropium-albuterol (DUONEB) 0.5-2.5 (3) MG/3ML nebulizer solution 3 mL (3 mLs Nebulization Given 12/23/16 1000)  albuterol (PROVENTIL) (2.5 MG/3ML) 0.083%  nebulizer solution 2.5 mg (2.5 mg Nebulization Given 12/23/16 1000)  methylPREDNISolone sodium succinate (SOLU-MEDROL) 125 mg/2 mL injection 125 mg (125 mg Intravenous Given 12/23/16 1053)  albuterol (PROVENTIL) (2.5 MG/3ML) 0.083% nebulizer solution 5 mg (5 mg Nebulization Given 12/23/16 1139)  ipratropium (ATROVENT) nebulizer solution 0.5 mg (0.5 mg Nebulization Given 12/23/16 1139)  albuterol (PROVENTIL) (2.5 MG/3ML) 0.083% nebulizer solution 5 mg (5 mg Nebulization Given 12/23/16 1228)  ipratropium (ATROVENT) nebulizer solution 0.5 mg (0.5 mg Nebulization Given 12/23/16 1228)   ED ECG REPORT- 10:32 (multiple EKGs taken and none would upload into epic)  Date: 12/23/2016  Rate: 68  Rhythm: normal sinus rhythm  QRS Axis: normal  Intervals: normal  ST/T Wave abnormalities: early repolarization  Conduction Disutrbances:none  Narrative Interpretation:   Old EKG Reviewed: no significant changes compared to prior ekgcs  EKG link not available in epic for interpretation in muse  CRITICAL CARE Performed by: Threasa Beards Total critical care time: 71minutes Critical care time was exclusive of separately billable procedures and treating other patients. Critical care was necessary to treat or prevent imminent or life-threatening deterioration. Critical care was time spent personally by me on the following activities: development of treatment plan with patient and/or surrogate as well as nursing, discussions with consultants, evaluation of patient's response to treatment, examination of patient, obtaining history from patient or surrogate, ordering and performing treatments and interventions, ordering and review of laboratory studies, ordering and review of radiographic studies, pulse oximetry and re-evaluation of patient's condition.  Initial Impression / Assessment and Plan / ED Course  I have reviewed the triage vital signs and the nursing notes.  Pertinent labs & imaging results that were available  during my care of the patient were reviewed by me and considered in my medical decision making (see chart for details).    1:24 PM d/w Dr. Wynelle Cleveland, hospitalist for transfer to [facility] long to med/surg bed for COPD exacerbation  Pt presenting with shortness of breath, wheezing- she had an initial O2 sat of 79% on RA, after being placed on [place] oxygen O2 sats improved.  She was given duonebs and solumedrol, CXR c/w changes of COPD.  Elevation in WBC most c/w recent prednisone use.  EKG not able to be entered into epic- shows NSR no acute changes.  Pt admitted to triad, transferred to [facility] long for further care.    Final Clinical Impressions(s) / ED Diagnoses   Final diagnoses:  COPD exacerbation ([place])    New Prescriptions New Prescriptions   No medications on file     Alfonzo Beers, MD 12/24/16 [phone]

## 2016-12-23 NOTE — H&P (Signed)
History and Physical    Catherine Phillips HUT:[id number] DOB: 1945-09-22  DOA: 12/23/2016 PCP: Iran Planas, PA-C  Patient coming from: Home   Chief Complaint: SOB   HPI: Catherine Phillips is a 71 y.o. female with medical history significant of COPD and HTN been transferred from [facility] due to COPD exacerbation. Patient presented with worsening in SOB and cough non productive. Symptoms began 5 day PTA, with flu like illness, was seen by PCP whom prescribed Prednisone 50 mg x days which patient completed. On day of admission patient report worsening in SOB a with cough and rib pain. No other associated symptoms. Trial of nebulizer at home did not improve the symptoms.    ED Course: Patient was sating 79% on room air, was given solumedrol and 3 nebs treatment, Pulse ox continue to be in the low 80's.   Review of Systems:   General: no changes in body weight, no fever chills or decrease in energy.  HEENT: no blurry vision, hearing changes or sore throat Respiratory: See HPI  CV: no chest pain, no palpitations GI: no nausea, vomiting, abdominal pain, diarrhea, constipation GU: no dysuria, burning on urination, increased urinary frequency, hematuria  Ext:. No deformities,  Neuro: no unilateral weakness, numbness, or tingling, no vision change or hearing loss Skin: No rashes, lesions or wounds. MSK: No muscle spasm, no deformity, no limitation of range of movement in spin Heme: No easy bruising.  Travel history: No recent long distant travel.   Past Medical History:  Diagnosis Date  . Anxiety 06/24/2012  . COPD (chronic obstructive pulmonary disease) ([place])   . Diverticulosis   . Emphysema 06/24/2012  . Emphysema of lung ([place])   . Essential hypertension 06/24/2012  . History of Left leg claudication 06/24/2012  . Hypertension   . Renal disorder    Renal Insufficiency R=30%  . Shortness of breath dyspnea     Past Surgical History:  Procedure Laterality Date  . ABDOMINAL AORTAGRAM  12/09/12    . ABDOMINAL AORTAGRAM N/A 12/09/2012   Procedure: ABDOMINAL Maxcine Ham;  Surgeon: Angelia Mould, MD;  Location: [facility] CATH LAB;  Service: Cardiovascular;  Laterality: N/A;     reports that she quit smoking about 6 years ago. Her smoking use included Cigarettes. She has a 45.00 pack-year smoking history. She has never used smokeless tobacco. She reports that she does not drink alcohol or use drugs.  Allergies  Allergen Reactions  . Bactrim [Sulfamethoxazole-Trimethoprim] Other (See Comments)    hyperkalemia  . Codeine Nausea And Vomiting  . Levaquin [Levofloxacin In D5w] Itching and Rash    Family History  Problem Relation Age of Onset  . Cancer Mother   . Heart attack Father   . Deep vein thrombosis Father   . Hyperlipidemia Father   . Hypertension Father    Prior to Admission medications   Medication Sig Start Date End Date Taking? Authorizing Provider  acetaminophen (TYLENOL) 500 MG tablet Take 1,000 mg by mouth every 6 (six) hours as needed (pain).   Yes Historical Provider, MD  albuterol (PROVENTIL HFA;VENTOLIN HFA) 108 (90 Base) MCG/ACT inhaler Inhale 2 puffs into the lungs every 4 (four) hours as needed for wheezing or shortness of breath. 05/16/16  Yes Sean Hommel, DO  albuterol (PROVENTIL) (2.5 MG/3ML) 0.083% nebulizer solution Take 3 mLs (2.5 mg total) by nebulization every 6 (six) hours as needed for wheezing or shortness of breath. 05/31/16  Yes Jade L Breeback, PA-C  amLODipine (NORVASC) 10 MG tablet Take 5 mg by  mouth daily. 12/16/16  Yes Historical Provider, MD  budesonide-formoterol (SYMBICORT) 160-4.5 MCG/ACT inhaler Inhale 2 puffs into the lungs 2 (two) times daily. Patient taking differently: Inhale 2 puffs into the lungs 2 (two) times daily as needed (shortness of breath/ wheezing).  04/12/16  Yes Sean Hommel, DO  guaifenesin (HUMIBID E) 400 MG TABS tablet Take 1 tablet (400 mg total) by mouth every 6 (six) hours as needed (cough). 12/18/16  Yes Emeterio Reeve, DO   labetalol (NORMODYNE) 100 MG tablet Take 100 mg by mouth daily.  11/02/16  Yes Historical Provider, MD  Pitavastatin Calcium (LIVALO) 4 MG TABS Take 1 tablet (4 mg total) by mouth every other day. 10/03/16  Yes Jade L Breeback, PA-C  pregabalin (LYRICA) 50 MG capsule TAKE ONE CAPSULE 3 TIMES A DAY Patient taking differently: Take 50 mg by mouth daily.  07/12/16  Yes Jade L Breeback, PA-C  pantoprazole (PROTONIX) 40 MG tablet Take 2 tablets (80 mg total) by mouth daily. Patient taking differently: Take 40 mg by mouth 2 (two) times daily before a meal.  04/12/16   Sean Hommel, DO  predniSONE (DELTASONE) 50 MG tablet Take one tablet for 5 days. 12/18/16   Emeterio Reeve, DO    Physical Exam: Vitals:   12/23/16 1347 12/23/16 1430 12/23/16 1500 12/23/16 1658  BP: (!) 145/53 129/72 (!) 140/54 (!) 157/66  Pulse: 81 80 74 76  Resp: (!) 23 20 (!) 21 20  Temp:    98.2 F (36.8 C)  TempSrc:    Oral  SpO2: 92% 93% 93% 94%  Weight:    54.5 kg (120 lb 2.4 oz)  Height:    5\' 2"  (1.575 m)     Constitutional: NAD, calm, comfortable, on O2 2L [place]  Eyes: PERRL, lids and conjunctivae normal ENMT: Mucous membranes are moist. Posterior pharynx clear of any exudate or lesions.  Neck: normal, supple, no masses, no thyromegaly Respiratory: Air entry diminish b/l. No wheezing or rales.  Normal respiratory effort. Cardiovascular: Regular rate and rhythm, no murmurs / rubs / gallops. No extremity edema. 2+ pedal pulses.  Abdomen: no tenderness, no masses palpated. Bowel sounds positive.  Musculoskeletal: no clubbing / cyanosis. No joint deformity upper and lower extremities. Skin: no rashes, lesions, ulcers. No induration Neurologic: CN 2-12 grossly intact.   Psychiatric: Normal judgment and insight. Alert and oriented x 3. Normal mood.    Labs on Admission: I have personally reviewed following labs and imaging studies  CBC:  Recent Labs Lab 12/23/16 1012  WBC 21.4*  HGB 13.1  HCT 40.2  MCV 95.9   PLT 366   Basic Metabolic Panel:  Recent Labs Lab 12/23/16 1012  NA 136  K 3.8  CL 103  CO2 25  GLUCOSE 100*  BUN 31*  CREATININE 1.17*  CALCIUM 9.2   GFR: Estimated Creatinine Clearance: 35.4 mL/min (A) (by C-G formula based on SCr of 1.17 mg/dL (H)). Liver Function Tests: No results for input(s): AST, ALT, ALKPHOS, BILITOT, PROT, ALBUMIN in the last 168 hours. No results for input(s): LIPASE, AMYLASE in the last 168 hours. No results for input(s): AMMONIA in the last 168 hours. Coagulation Profile: No results for input(s): INR, PROTIME in the last 168 hours. Cardiac Enzymes: No results for input(s): CKTOTAL, CKMB, CKMBINDEX, TROPONINI in the last 168 hours. BNP (last 3 results) No results for input(s): PROBNP in the last 8760 hours. HbA1C: No results for input(s): HGBA1C in the last 72 hours. CBG: No results for input(s): GLUCAP in  the last 168 hours. Lipid Profile: No results for input(s): CHOL, HDL, LDLCALC, TRIG, CHOLHDL, LDLDIRECT in the last 72 hours. Thyroid Function Tests: No results for input(s): TSH, T4TOTAL, FREET4, T3FREE, THYROIDAB in the last 72 hours. Anemia Panel: No results for input(s): VITAMINB12, FOLATE, FERRITIN, TIBC, IRON, RETICCTPCT in the last 72 hours. Urine analysis:    Component Value Date/Time   COLORURINE YELLOW 06/13/2016 [place] 06/13/2016 1033   LABSPEC 1.013 06/13/2016 1033   PHURINE 6.0 06/13/2016 1033   GLUCOSEU NEGATIVE 06/13/2016 1033   HGBUR NEGATIVE 06/13/2016 1033   BILIRUBINUR NEGATIVE 06/13/2016 1033   BILIRUBINUR negative 05/16/2016 1434   KETONESUR NEGATIVE 06/13/2016 1033   PROTEINUR NEGATIVE 06/13/2016 1033   UROBILINOGEN 0.2 05/16/2016 1434   UROBILINOGEN 0.2 06/26/2015 1700   NITRITE NEGATIVE 06/13/2016 1033   LEUKOCYTESUR NEGATIVE 06/13/2016 1033    Radiological Exams on Admission: Dg Chest 2 View  Result Date: 12/23/2016 CLINICAL DATA:  Nonproductive cough for 5 days. Right lower chest  pain starting this morning. History of COPD, hypertension. EXAM: CHEST  2 VIEW COMPARISON:  Chest x-ray dated 10/04/2015. FINDINGS: Heart size and mediastinal contours are within normal limits. Lungs are hyperexpanded compatible with history of COPD. Coarse lung markings indicate associated chronic interstitial lung disease. Suspect associated chronic bronchitic changes centrally. No evidence of acute pulmonary abnormality. No pleural effusion or pneumothorax seen. No acute or suspicious osseous finding. IMPRESSION: 1. No active cardiopulmonary disease. No evidence of pneumonia or pulmonary edema. 2. COPD/emphysema, chronic interstitial lung disease bilaterally and suspected chronic bronchitic changes centrally. Electronically Signed   By: Franki Cabot M.D.   On: 12/23/2016 11:00    EKG: Independently reviewed. Normal sinus rhythm @ 70  CRX: Reviewed independently   Assessment/Plan Acute respiratory failure with hypoxia due to COPD exacerbation - failing outpatient treatment  Admit to medsurg  Continues pulse ox  Will start Azithro  Continue solumedrol Continue Duonebs, will add Brovana and Pulmicort Wean O2 as tolerated  Check pulse ox with ambulation  Check procalcitonin   HTN - BP stable  Continue Labetalol   CKD stage III  Cr at baseline  Encourage oral hydration  Monitor BMP in AM   Leukocytosis 2/2 to steroid use  No signs of infection  Repeat CBC in AM    DVT prophylaxis: Lovenox  Code Status: FULL  Family Communication: None at bedside  Disposition Plan: Likely to be d/c home in AM if pulse ox stable Consults called: None  Admission status: Obs/Medsurg   Chipper Oman MD [facility] Pager: Text Page via [email or web address]  [phone]  If 7PM-7AM, please contact night-coverage [email or web address] Password [facility]  12/23/2016, 5:34 PM

## 2016-12-24 DIAGNOSIS — Z7951 Long term (current) use of inhaled steroids: Secondary | ICD-10-CM | POA: Diagnosis not present

## 2016-12-24 DIAGNOSIS — Z8249 Family history of ischemic heart disease and other diseases of the circulatory system: Secondary | ICD-10-CM | POA: Diagnosis not present

## 2016-12-24 DIAGNOSIS — T380X5A Adverse effect of glucocorticoids and synthetic analogues, initial encounter: Secondary | ICD-10-CM | POA: Diagnosis present

## 2016-12-24 DIAGNOSIS — G629 Polyneuropathy, unspecified: Secondary | ICD-10-CM | POA: Diagnosis present

## 2016-12-24 DIAGNOSIS — Y92019 Unspecified place in single-family (private) house as the place of occurrence of the external cause: Secondary | ICD-10-CM | POA: Diagnosis not present

## 2016-12-24 DIAGNOSIS — M810 Age-related osteoporosis without current pathological fracture: Secondary | ICD-10-CM | POA: Diagnosis present

## 2016-12-24 DIAGNOSIS — I129 Hypertensive chronic kidney disease with stage 1 through stage 4 chronic kidney disease, or unspecified chronic kidney disease: Secondary | ICD-10-CM | POA: Diagnosis present

## 2016-12-24 DIAGNOSIS — E785 Hyperlipidemia, unspecified: Secondary | ICD-10-CM | POA: Diagnosis present

## 2016-12-24 DIAGNOSIS — K219 Gastro-esophageal reflux disease without esophagitis: Secondary | ICD-10-CM | POA: Diagnosis present

## 2016-12-24 DIAGNOSIS — J441 Chronic obstructive pulmonary disease with (acute) exacerbation: Secondary | ICD-10-CM | POA: Diagnosis not present

## 2016-12-24 DIAGNOSIS — Z79899 Other long term (current) drug therapy: Secondary | ICD-10-CM | POA: Diagnosis not present

## 2016-12-24 DIAGNOSIS — M19049 Primary osteoarthritis, unspecified hand: Secondary | ICD-10-CM | POA: Diagnosis present

## 2016-12-24 DIAGNOSIS — N183 Chronic kidney disease, stage 3 (moderate): Secondary | ICD-10-CM | POA: Diagnosis present

## 2016-12-24 DIAGNOSIS — J069 Acute upper respiratory infection, unspecified: Secondary | ICD-10-CM | POA: Diagnosis present

## 2016-12-24 DIAGNOSIS — J9601 Acute respiratory failure with hypoxia: Secondary | ICD-10-CM | POA: Diagnosis not present

## 2016-12-24 DIAGNOSIS — Z885 Allergy status to narcotic agent status: Secondary | ICD-10-CM | POA: Diagnosis not present

## 2016-12-24 DIAGNOSIS — Z881 Allergy status to other antibiotic agents status: Secondary | ICD-10-CM | POA: Diagnosis not present

## 2016-12-24 DIAGNOSIS — D72829 Elevated white blood cell count, unspecified: Secondary | ICD-10-CM | POA: Diagnosis present

## 2016-12-24 DIAGNOSIS — I70219 Atherosclerosis of native arteries of extremities with intermittent claudication, unspecified extremity: Secondary | ICD-10-CM | POA: Diagnosis present

## 2016-12-24 DIAGNOSIS — Z87891 Personal history of nicotine dependence: Secondary | ICD-10-CM | POA: Diagnosis not present

## 2016-12-24 LAB — COMPREHENSIVE METABOLIC PANEL
ALBUMIN: 3.4 g/dL — AB (ref 3.5–5.0)
ALK PHOS: 52 U/L (ref 38–126)
ALT: 34 U/L (ref 14–54)
AST: 20 U/L (ref 15–41)
Anion gap: 9 (ref 5–15)
BUN: 35 mg/dL — AB (ref 6–20)
CALCIUM: 8.9 mg/dL (ref 8.9–10.3)
CHLORIDE: 106 mmol/L (ref 101–111)
CO2: 23 mmol/L (ref 22–32)
CREATININE: 1.43 mg/dL — AB (ref 0.44–1.00)
GFR calc Af Amer: 42 mL/min — ABNORMAL LOW (ref >60)
GFR calc non Af Amer: 36 mL/min — ABNORMAL LOW (ref >60)
GLUCOSE: 165 mg/dL — AB (ref 65–99)
Potassium: 4.8 mmol/L (ref 3.5–5.1)
SODIUM: 138 mmol/L (ref 135–145)
Total Bilirubin: 0.4 mg/dL (ref 0.3–1.2)
Total Protein: 6.9 g/dL (ref 6.5–8.1)

## 2016-12-24 LAB — MAGNESIUM: Magnesium: 2.1 mg/dL (ref 1.7–2.4)

## 2016-12-24 MED ORDER — ENOXAPARIN SODIUM 30 MG/0.3ML ~~LOC~~ SOLN
30.0000 mg | SUBCUTANEOUS | Status: DC
  Administered 2016-12-24: 30 mg via SUBCUTANEOUS
  Filled 2016-12-24: qty 0.3

## 2016-12-24 MED ORDER — IPRATROPIUM-ALBUTEROL 0.5-2.5 (3) MG/3ML IN SOLN
3.0000 mL | Freq: Three times a day (TID) | RESPIRATORY_TRACT | Status: DC
  Administered 2016-12-25: 3 mL via RESPIRATORY_TRACT
  Filled 2016-12-24: qty 3

## 2016-12-24 NOTE — Progress Notes (Signed)
Patient ambulated 250' on room air. Her O2 sat maintained at 81%.

## 2016-12-24 NOTE — Progress Notes (Addendum)
Attempted to wean O2 sats reading 87% O2 2L reapplied O2 sat reading 94%

## 2016-12-24 NOTE — Progress Notes (Signed)
[facility]                                                                              Patient Demographics  Catherine Phillips, is a 71 y.o. female, DOB - 1946/03/10, OQH:[id number]  Admit date - 12/23/2016   Admitting Physician Debbe Odea, MD  Outpatient Primary MD for the patient is Iran Planas, PA-C  Outpatient specialists:   LOS - 1  days    Chief Complaint  Patient presents with  . URI       Brief summary  Catherine Phillips is a 71 y.o. female with medical history significant for but not limited to COPD presenting with 5 day h/o worsening in SOB and non-productive cough without fever or chills, starting with flu-like symptoms. She had failed OP tx with burst of orally steroids with Nebs by her PCP. Pt was hypoxic at ED with of 79%- low 80's on room air, despite solumedrol and 3 nebs treatment.  Assessment & Plan    Active Problems:   Acute respiratory failure with hypoxia (HCC)   COPD exacerbation (HCC)  Acute hypoxic respiratory failure due to COPD exacerbation: Bronchodilators/pulmonary toileting measures/steroid/antibiotic Supplemental oxygen May need home O2  Leukocytosis: Steroid induced-.infectious etiology-consider adding Rocephin if persistent, and respiratory status do not improve.  CK D3: Stable Monitor renal function with electrolytes  Hyperglycemia: Steroid-induced Check A1c-no prior history of diabetes mellitus  Code Status: Full code DVT Prophylaxis:  Lovenox  Family Communication: Discussed in detail with the patient, and spouse at bedside Disposition Plan: Home  Time Spent in minutes  35 minutes  Procedures:    Consultants:    Antimicrobials:  Azithromycin   Medications  Scheduled Meds: . amLODipine  5 mg Oral Daily  . arformoterol  15 mcg Nebulization BID  . azithromycin  250 mg Oral Daily  . budesonide (PULMICORT) nebulizer solution  0.25 mg Nebulization BID  . enoxaparin (LOVENOX) injection  30 mg  Subcutaneous Q24H  . ipratropium-albuterol  3 mL Nebulization Q6H  . labetalol  100 mg Oral BID  . methylPREDNISolone (SOLU-MEDROL) injection  60 mg Intravenous Q12H  . pantoprazole  40 mg Oral BID AC  . pravastatin  80 mg Oral q1800  . pregabalin  50 mg Oral Daily   Continuous Infusions: PRN Meds:.acetaminophen, albuterol, ondansetron **OR** ondansetron (ZOFRAN) IV   Antibiotics   Anti-infectives    Start     Dose/Rate Route Frequency Ordered Stop   12/24/16 1000  azithromycin (ZITHROMAX) tablet 250 mg     250 mg Oral Daily 12/23/16 1748 12/28/16 0959   12/23/16 1800  azithromycin (ZITHROMAX) 500 mg in dextrose 5 % 250 mL IVPB     500 mg 250 mL/hr over 60 Minutes Intravenous  Once 12/23/16 1748 12/23/16 1856   12/23/16 1335  azithromycin (ZITHROMAX) 500 MG injection    Comments:  Adkins, Crystal   : cabinet override      12/23/16 1335 12/23/16 1756   12/23/16 1330  azithromycin (ZITHROMAX) 500 mg in dextrose 5 % 250 mL IVPB     500 mg 250 mL/hr over 60 Minutes Intravenous  Once 12/23/16 1319 12/23/16 1442  Subjective:   Catherine Phillips was seen and examined today. No acute events reported overnight. She is feeling better, but has significant desaturation on ambulation. Patient denies dizziness, chest pain abdominal pain, N/V/D/C  Objective:   Vitals:   12/24/16 0537 12/24/16 0813 12/24/16 1008 12/24/16 1326  BP: (!) 163/64  (!) 154/59 (!) 145/61  Pulse: 80   81  Resp: 16   16  Temp: 97.5 F (36.4 C)   98.6 F (37 C)  TempSrc: Oral   Oral  SpO2: 95% 96%  93%  Weight:      Height:        Intake/Output Summary (Last 24 hours) at 12/24/16 1401 Last data filed at 12/24/16 1327  Gross per 24 hour  Intake              840 ml  Output                0 ml  Net              840 ml     Wt Readings from Last 3 Encounters:  12/23/16 54.5 kg (120 lb 2.4 oz)  12/18/16 52.6 kg (116 lb)  12/05/16 53.1 kg (117 lb)     Exam  General: NAD  HEENT: NCAT,   PERRL,MMM  Neck: SUPPLE, (-) JVD  Cardiovascular: RRR, (-) GALLOP, (-) MURMUR  Respiratory: Diminished breath sounds bilaterally with occasional wheezes  Gastrointestinal: SOFT, (-) DISTENSION, BS(+), (_) TENDERNESS  Ext: (-) CYANOSIS, (-) EDEMA  Neuro: A, OX 3  Skin:(-) RASH  Psych:NORMAL AFFECT/MOOD   Data Reviewed:  I have personally reviewed following labs and imaging studies  Micro Results No results found for this or any previous visit (from the past 240 hour(s)).  Radiology Reports Dg Chest 2 View  Result Date: 12/23/2016 CLINICAL DATA:  Nonproductive cough for 5 days. Right lower chest pain starting this morning. History of COPD, hypertension. EXAM: CHEST  2 VIEW COMPARISON:  Chest x-ray dated 10/04/2015. FINDINGS: Heart size and mediastinal contours are within normal limits. Lungs are hyperexpanded compatible with history of COPD. Coarse lung markings indicate associated chronic interstitial lung disease. Suspect associated chronic bronchitic changes centrally. No evidence of acute pulmonary abnormality. No pleural effusion or pneumothorax seen. No acute or suspicious osseous finding. IMPRESSION: 1. No active cardiopulmonary disease. No evidence of pneumonia or pulmonary edema. 2. COPD/emphysema, chronic interstitial lung disease bilaterally and suspected chronic bronchitic changes centrally. Electronically Signed   By: Franki Cabot M.D.   On: 12/23/2016 11:00    Lab Data:  CBC:  Recent Labs Lab 12/23/16 1012  WBC 21.4*  HGB 13.1  HCT 40.2  MCV 95.9  PLT 989   Basic Metabolic Panel:  Recent Labs Lab 12/23/16 1012 12/24/16 0417  NA 136 138  K 3.8 4.8  CL 103 106  CO2 25 23  GLUCOSE 100* 165*  BUN 31* 35*  CREATININE 1.17* 1.43*  CALCIUM 9.2 8.9  MG  --  2.1   GFR: Estimated Creatinine Clearance: 29 mL/min (A) (by C-G formula based on SCr of 1.43 mg/dL (H)). Liver Function Tests:  Recent Labs Lab 12/24/16 0417  AST 20  ALT 34  ALKPHOS 52    BILITOT 0.4  PROT 6.9  ALBUMIN 3.4*   No results for input(s): LIPASE, AMYLASE in the last 168 hours. No results for input(s): AMMONIA in the last 168 hours. Coagulation Profile: No results for input(s): INR, PROTIME in the last 168 hours. Cardiac Enzymes: No results  for input(s): CKTOTAL, CKMB, CKMBINDEX, TROPONINI in the last 168 hours. BNP (last 3 results) No results for input(s): PROBNP in the last 8760 hours. HbA1C: No results for input(s): HGBA1C in the last 72 hours. CBG: No results for input(s): GLUCAP in the last 168 hours. Lipid Profile: No results for input(s): CHOL, HDL, LDLCALC, TRIG, CHOLHDL, LDLDIRECT in the last 72 hours. Thyroid Function Tests: No results for input(s): TSH, T4TOTAL, FREET4, T3FREE, THYROIDAB in the last 72 hours. Anemia Panel: No results for input(s): VITAMINB12, FOLATE, FERRITIN, TIBC, IRON, RETICCTPCT in the last 72 hours. Urine analysis:    Component Value Date/Time   COLORURINE YELLOW 06/13/2016 [place] 06/13/2016 1033   LABSPEC 1.013 06/13/2016 1033   PHURINE 6.0 06/13/2016 1033   GLUCOSEU NEGATIVE 06/13/2016 1033   HGBUR NEGATIVE 06/13/2016 1033   BILIRUBINUR NEGATIVE 06/13/2016 1033   BILIRUBINUR negative 05/16/2016 [place] 06/13/2016 1033   PROTEINUR NEGATIVE 06/13/2016 1033   UROBILINOGEN 0.2 05/16/2016 1434   UROBILINOGEN 0.2 06/26/2015 1700   NITRITE NEGATIVE 06/13/2016 1033   LEUKOCYTESUR NEGATIVE 06/13/2016 1033     OSEI-BONSU,Janet Humphreys M.D. [facility] 12/24/2016, 2:01 PM  Pager: [phone] Between 7am to 7pm - call Pager - [phone]  After 7pm go to [email or web address] - password [email or web address]  Call night coverage person covering after 7pm

## 2016-12-25 DIAGNOSIS — J441 Chronic obstructive pulmonary disease with (acute) exacerbation: Principal | ICD-10-CM

## 2016-12-25 DIAGNOSIS — J9601 Acute respiratory failure with hypoxia: Secondary | ICD-10-CM

## 2016-12-25 LAB — BASIC METABOLIC PANEL
ANION GAP: 10 (ref 5–15)
BUN: 40 mg/dL — AB (ref 6–20)
CALCIUM: 9.1 mg/dL (ref 8.9–10.3)
CHLORIDE: 103 mmol/L (ref 101–111)
CO2: 23 mmol/L (ref 22–32)
Creatinine, Ser: 1.46 mg/dL — ABNORMAL HIGH (ref 0.44–1.00)
GFR calc Af Amer: 41 mL/min — ABNORMAL LOW (ref >60)
GFR calc non Af Amer: 35 mL/min — ABNORMAL LOW (ref >60)
GLUCOSE: 176 mg/dL — AB (ref 65–99)
POTASSIUM: 5.2 mmol/L — AB (ref 3.5–5.1)
Sodium: 136 mmol/L (ref 135–145)

## 2016-12-25 LAB — CBC
HCT: 39.3 % (ref 36.0–46.0)
HEMOGLOBIN: 12.9 g/dL (ref 12.0–15.0)
MCH: 30.6 pg (ref 26.0–34.0)
MCHC: 32.8 g/dL (ref 30.0–36.0)
MCV: 93.1 fL (ref 78.0–100.0)
Platelets: 337 10*3/uL (ref 150–400)
RBC: 4.22 MIL/uL (ref 3.87–5.11)
RDW: 13.4 % (ref 11.5–15.5)
WBC: 24.5 10*3/uL — ABNORMAL HIGH (ref 4.0–10.5)

## 2016-12-25 LAB — PROCALCITONIN: Procalcitonin: <0.1 ng/mL

## 2016-12-25 MED ORDER — AZITHROMYCIN 250 MG PO TABS: 250.0000 mg | ORAL_TABLET | Freq: Every day | ORAL | 0 refills | Status: DC

## 2016-12-25 MED ORDER — PREDNISONE 50 MG PO TABS: ORAL_TABLET | ORAL | 0 refills | Status: DC

## 2016-12-25 MED ORDER — IPRATROPIUM-ALBUTEROL 0.5-2.5 (3) MG/3ML IN SOLN: 3.0000 mL | RESPIRATORY_TRACT | 3 refills | Status: DC | PRN

## 2016-12-25 MED ORDER — SODIUM POLYSTYRENE SULFONATE 15 GM/60ML PO SUSP
7.5000 g | Freq: Once | ORAL | Status: AC
  Administered 2016-12-25: 7.5 g via ORAL
  Filled 2016-12-25: qty 60

## 2016-12-25 NOTE — Discharge Summary (Signed)
Physician Discharge Summary  Marika Mahaffy MWN:[id number] DOB: [birth year] DOA: 12/23/2016  PCP: Iran Planas, PA-C  Admit date: 12/23/2016 Discharge date: 12/25/2016  Recommendations for Outpatient Follow-up:  Take azithromycin for 4 days and discharge. Take prednisone 50 mg a day for 5 days on discharge and then stop. Continue DuoNeb nebulizer every 4 hours as needed for shortness of breath or wheezing. Follow-up A1c level on outpatient basis.   Discharge Diagnoses:  Active Problems:   Acute respiratory failure with hypoxia (HCC)   COPD exacerbation (HCC)   Discharge Condition: stable   Diet recommendation: as tolerated   History of present illness:  71 y.o.femalewith medical history significant for but not limited to COPD presenting with 5 day h/o worsening in SOB and non-productive cough without fever or chills, starting with flu-like symptoms. She had failed OP tx with oral steroids and with Nebs by her PCP. Pt was hypoxic at ED with of 79%- low 80's on room air, despite solumedrol and 3 nebs treatment.  Hospital Course:   Acute hypoxic respiratory failure due to COPD exacerbation: Continue oxygen as per prior to the admission Continue DuoNeb nebulizer every 4 hours as needed on discharge, patient may resume her regular inhalers at home as well Patient will continue prednisone for 5 days on discharge  Leukocytosis Steroid induced  CKD3 Stable  Hyperglycemia Steroid-induced She will be on 5 days on prednisone on discharge  Stable  Pt did not want to be on insulin at home, she will follow up with PCP what her CBGs are in about a week  A1c is pending and will be followed up on outpt basis    Code Status: Full code DVT Prophylaxis:  Lovenox  Family Communication:family not at the bedside this am     Procedures:  None  Consultants:   None   Antimicrobials:  Azithromycin    Signed:  Leisa Lenz, MD  [facility] 12/25/2016, 10:51 AM  Pager  #: [phone]  Time spent in minutes: less than 30 minutes    Discharge Exam: Vitals:   12/25/16 0558 12/25/16 0740  BP: (!) 136/49 (!) 154/59  Pulse: 83 61  Resp: 16 16  Temp: 97.8 F (36.6 C)    Vitals:   12/24/16 2032 12/25/16 0558 12/25/16 0740 12/25/16 0847  BP: (!) 169/60 (!) 136/49 (!) 154/59   Pulse: 72 83 61   Resp: 16 16 16    Temp: 97.6 F (36.4 C) 97.8 F (36.6 C)    TempSrc: Oral Oral    SpO2: 96% 94% 93% 94%  Weight:      Height:        General: Pt is alert, follows commands appropriately, not in acute distress Cardiovascular: Regular rate and rhythm, S1/S2 + Respiratory: no wheezing, no crackles, no rhonchi Abdominal: Soft, non tender, non distended, bowel sounds +, no guarding Extremities: no edema, no cyanosis, pulses palpable bilaterally DP and PT Neuro: Grossly nonfocal  Discharge Instructions  Discharge Instructions    Call MD for:  persistant nausea and vomiting    Complete by:  As directed    Call MD for:  redness, tenderness, or signs of infection (pain, swelling, redness, odor or green/yellow discharge around incision site)    Complete by:  As directed    Call MD for:  severe uncontrolled pain    Complete by:  As directed    Diet - low sodium heart healthy    Complete by:  As directed    Discharge instructions  Complete by:  As directed    Take azithromycin for 4 days and discharge. Take prednisone 50 mg a day for 5 days on discharge and then stop. Continue DuoNeb nebulizer every 4 hours as needed for shortness of breath or wheezing.   Increase activity slowly    Complete by:  As directed      Allergies as of 12/25/2016      Reactions   Bactrim [sulfamethoxazole-trimethoprim] Other (See Comments)   hyperkalemia   Codeine Nausea And Vomiting   Levaquin [levofloxacin In D5w] Itching, Rash      Medication List    TAKE these medications   acetaminophen 500 MG tablet Commonly known as:  TYLENOL Take 1,000 mg by mouth every 6 (six)  hours as needed (pain).   albuterol 108 (90 Base) MCG/ACT inhaler Commonly known as:  PROVENTIL HFA;VENTOLIN HFA Inhale 2 puffs into the lungs every 4 (four) hours as needed for wheezing or shortness of breath. What changed:  Another medication with the same name was removed. Continue taking this medication, and follow the directions you see here.   amLODipine 10 MG tablet Commonly known as:  NORVASC Take 5 mg by mouth daily.   azithromycin 250 MG tablet Commonly known as:  ZITHROMAX Take 1 tablet (250 mg total) by mouth daily.   budesonide-formoterol 160-4.5 MCG/ACT inhaler Commonly known as:  SYMBICORT Inhale 2 puffs into the lungs 2 (two) times daily. What changed:  when to take this  reasons to take this   guaifenesin 400 MG Tabs tablet Commonly known as:  HUMIBID E Take 1 tablet (400 mg total) by mouth every 6 (six) hours as needed (cough).   ipratropium-albuterol 0.5-2.5 (3) MG/3ML Soln Commonly known as:  DUONEB Take 3 mLs by nebulization every 4 (four) hours as needed.   labetalol 100 MG tablet Commonly known as:  NORMODYNE Take 100 mg by mouth daily.   pantoprazole 40 MG tablet Commonly known as:  PROTONIX Take 2 tablets (80 mg total) by mouth daily. What changed:  how much to take  when to take this   Pitavastatin Calcium 4 MG Tabs Commonly known as:  LIVALO Take 1 tablet (4 mg total) by mouth every other day.   predniSONE 50 MG tablet Commonly known as:  DELTASONE Take one tablet for 5 days.   pregabalin 50 MG capsule Commonly known as:  LYRICA TAKE ONE CAPSULE 3 TIMES A DAY What changed:  how much to take  how to take this  when to take this  additional instructions      Follow-up Information    Iran Planas, PA-C. Schedule an appointment as soon as possible for a visit in 1 week(s).   Specialty:  Family Medicine Contact information: [address] [place] [address] [phone]            The results  of significant diagnostics from this hospitalization (including imaging, microbiology, ancillary and laboratory) are listed below for reference.    Significant Diagnostic Studies: Dg Chest 2 View  Result Date: 12/23/2016 CLINICAL DATA:  Nonproductive cough for 5 days. Right lower chest pain starting this morning. History of COPD, hypertension. EXAM: CHEST  2 VIEW COMPARISON:  Chest x-ray dated 10/04/2015. FINDINGS: Heart size and mediastinal contours are within normal limits. Lungs are hyperexpanded compatible with history of COPD. Coarse lung markings indicate associated chronic interstitial lung disease. Suspect associated chronic bronchitic changes centrally. No evidence of acute pulmonary abnormality. No pleural effusion or pneumothorax seen.  No acute or suspicious osseous finding. IMPRESSION: 1. No active cardiopulmonary disease. No evidence of pneumonia or pulmonary edema. 2. COPD/emphysema, chronic interstitial lung disease bilaterally and suspected chronic bronchitic changes centrally. Electronically Signed   By: Franki Cabot M.D.   On: 12/23/2016 11:00    Microbiology: No results found for this or any previous visit (from the past 240 hour(s)).   Labs: Basic Metabolic Panel:  Recent Labs Lab 12/23/16 1012 12/24/16 0417 12/25/16 0441  NA 136 138 136  K 3.8 4.8 5.2*  CL 103 106 103  CO2 25 23 23   GLUCOSE 100* 165* 176*  BUN 31* 35* 40*  CREATININE 1.17* 1.43* 1.46*  CALCIUM 9.2 8.9 9.1  MG  --  2.1  --    Liver Function Tests:  Recent Labs Lab 12/24/16 0417  AST 20  ALT 34  ALKPHOS 52  BILITOT 0.4  PROT 6.9  ALBUMIN 3.4*   No results for input(s): LIPASE, AMYLASE in the last 168 hours. No results for input(s): AMMONIA in the last 168 hours. CBC:  Recent Labs Lab 12/23/16 1012 12/25/16 0441  WBC 21.4* 24.5*  HGB 13.1 12.9  HCT 40.2 39.3  MCV 95.9 93.1  PLT 281 337   Cardiac Enzymes: No results for input(s): CKTOTAL, CKMB, CKMBINDEX, TROPONINI in the last  168 hours. BNP: BNP (last 3 results) No results for input(s): BNP in the last 8760 hours.  ProBNP (last 3 results) No results for input(s): PROBNP in the last 8760 hours.  CBG: No results for input(s): GLUCAP in the last 168 hours.

## 2016-12-25 NOTE — Discharge Instructions (Signed)

## 2016-12-25 NOTE — Progress Notes (Signed)
Nutrition Brief Note  RD consulted via COPD Gold protocol.   Pt with stable weight. Currently consuming 75-100% of meals.  Wt Readings from Last 15 Encounters:  12/23/16 120 lb 2.4 oz (54.5 kg)  12/18/16 116 lb (52.6 kg)  12/05/16 117 lb (53.1 kg)  08/15/16 114 lb (51.7 kg)  06/28/16 114 lb (51.7 kg)  06/15/16 112 lb (50.8 kg)  05/31/16 115 lb (52.2 kg)  05/24/16 115 lb 12.8 oz (52.5 kg)  05/16/16 115 lb (52.2 kg)  04/25/16 114 lb (51.7 kg)  04/12/16 112 lb (50.8 kg)  12/16/15 116 lb (52.6 kg)  10/14/15 114 lb (51.7 kg)  10/04/15 111 lb (50.3 kg)  07/19/15 110 lb (49.9 kg)    Body mass index is 21.98 kg/m. Patient meets criteria for normal based on current BMI.   Current diet order is Heart Healthy, patient is consuming approximately 100% of meals at this time. Labs and medications reviewed.   No nutrition interventions warranted at this time. If nutrition issues arise, please consult RD.   Clayton Bibles, MS, RD, LDN Pager: [phone] After Hours Pager: [phone]

## 2016-12-26 ENCOUNTER — Other Ambulatory Visit: Payer: Self-pay | Admitting: *Deleted

## 2016-12-26 DIAGNOSIS — J329 Chronic sinusitis, unspecified: Principal | ICD-10-CM

## 2016-12-26 DIAGNOSIS — B9689 Other specified bacterial agents as the cause of diseases classified elsewhere: Secondary | ICD-10-CM

## 2016-12-26 LAB — HEMOGLOBIN A1C
HEMOGLOBIN A1C: 6.1 % — AB (ref 4.8–5.6)
Mean Plasma Glucose: 128 mg/dL

## 2016-12-26 MED ORDER — NYSTATIN 100000 UNIT/ML MT SUSP: 4.0000 mL | Freq: Four times a day (QID) | OROMUCOSAL | 0 refills | Status: DC

## 2016-12-26 NOTE — Telephone Encounter (Signed)
Ok to send nystatin mouthwash 56ml QID for 7 days for thrush.

## 2016-12-26 NOTE — Telephone Encounter (Signed)
Patient called office this morning because she has been in hospital for three days with respiratory issues and received oxygen. She believes the oxygen has given her thrush and is asking for liquid medication instead of pill form because pill does not work well for her.

## 2016-12-26 NOTE — Telephone Encounter (Signed)
Pt notified of rx. 

## 2016-12-27 ENCOUNTER — Encounter: Payer: Self-pay | Admitting: Physician Assistant

## 2016-12-27 ENCOUNTER — Ambulatory Visit (INDEPENDENT_AMBULATORY_CARE_PROVIDER_SITE_OTHER): Payer: 59 | Admitting: Vascular Surgery

## 2016-12-27 ENCOUNTER — Ambulatory Visit (INDEPENDENT_AMBULATORY_CARE_PROVIDER_SITE_OTHER): Payer: 59 | Admitting: Physician Assistant

## 2016-12-27 ENCOUNTER — Encounter: Payer: Self-pay | Admitting: Vascular Surgery

## 2016-12-27 VITALS — BP 157/72 | HR 88 | Ht 62.0 in | Wt 118.0 lb

## 2016-12-27 VITALS — BP 156/71 | HR 71 | Temp 97.7°F | Resp 18 | Ht 62.0 in | Wt 116.5 lb

## 2016-12-27 DIAGNOSIS — E875 Hyperkalemia: Secondary | ICD-10-CM | POA: Diagnosis not present

## 2016-12-27 DIAGNOSIS — I739 Peripheral vascular disease, unspecified: Secondary | ICD-10-CM

## 2016-12-27 DIAGNOSIS — J441 Chronic obstructive pulmonary disease with (acute) exacerbation: Secondary | ICD-10-CM | POA: Diagnosis not present

## 2016-12-27 DIAGNOSIS — I701 Atherosclerosis of renal artery: Secondary | ICD-10-CM

## 2016-12-27 MED ORDER — TIOTROPIUM BROMIDE MONOHYDRATE 2.5 MCG/ACT IN AERS: 2.0000 | INHALATION_SPRAY | Freq: Every day | RESPIRATORY_TRACT | 1 refills | Status: DC

## 2016-12-27 NOTE — Progress Notes (Signed)
Patient name: Catherine Phillips MRN: [id number] DOB: 04-Jun-1946 Sex: female  REASON FOR CONSULT: Claudication. Referred by Iran Planas, PA  HPI: Catherine Phillips is a 71 y.o. female, who I had seen back in 2014 with claudication. The patient underwent an arteriogram on 12/09/2012 which showed a chronic left external iliac artery occlusion. Patient had some mild infrainguinal arterial occlusive disease on the left. At that time, her symptoms were tolerable. She was not a smoker. I plan on seeing her back in 1 year but she was then lost to follow up.  She was sick several weeks ago and had an exacerbation of her COPD and was also dehydrated. She was having some leg pain at that time. Since that time she's been drinking also fluid and exercising in her legs feel much better. She does describe some claudication that involves her hips thighs and calves bilaterally but especially on the left side. She denies any history of rest pain or nonhealing ulcers.  I have reviewed the records that were sent with her. She does have a history of intermittent claudication. She also has a history of hyperkalemia. In addition she has a history of renal artery stenosis peripheral neuropathy and emphysema.   Past Medical History:  Diagnosis Date  . Anxiety 06/24/2012  . COPD (chronic obstructive pulmonary disease) ([place])   . Diverticulosis   . Emphysema 06/24/2012  . Emphysema of lung ([place])   . Essential hypertension 06/24/2012  . History of Left leg claudication 06/24/2012  . Hypertension   . Peripheral vascular disease ([place])   . Renal disorder    Renal Insufficiency R=30%  . Shortness of breath dyspnea     Family History  Problem Relation Age of Onset  . Cancer Mother   . Heart attack Father   . Deep vein thrombosis Father   . Hyperlipidemia Father   . Hypertension Father     SOCIAL HISTORY: Social History   Social History  . Marital status: Married    Spouse name: N/A  . Number of children: N/A  .  Years of education: N/A   Occupational History  . Not on file.   Social History Main Topics  . Smoking status: Former Smoker    Packs/day: 1.50    Years: 30.00    Types: Cigarettes    Quit date: 12/30/2009  . Smokeless tobacco: Never Used  . Alcohol use No  . Drug use: No  . Sexual activity: Not on file   Other Topics Concern  . Not on file   Social History Narrative  . No narrative on file    Allergies  Allergen Reactions  . Bactrim [Sulfamethoxazole-Trimethoprim] Other (See Comments)    hyperkalemia  . Codeine Nausea And Vomiting  . Levaquin [Levofloxacin In D5w] Itching and Rash    Current Outpatient Prescriptions  Medication Sig Dispense Refill  . acetaminophen (TYLENOL) 500 MG tablet Take 1,000 mg by mouth every 6 (six) hours as needed (pain).    [name] albuterol (PROVENTIL) (2.5 MG/3ML) 0.083% nebulizer solution     . amLODipine (NORVASC) 10 MG tablet Take 5 mg by mouth daily.    [name] azithromycin (ZITHROMAX) 250 MG tablet Take 1 tablet (250 mg total) by mouth daily. 4 each 0  . budesonide-formoterol (SYMBICORT) 160-4.5 MCG/ACT inhaler Inhale 2 puffs into the lungs 2 (two) times daily. (Patient taking differently: Inhale 2 puffs into the lungs 2 (two) times daily as needed (shortness of breath/ wheezing). ) 1 Inhaler 0  . guaifenesin (HUMIBID  E) 400 MG TABS tablet Take 1 tablet (400 mg total) by mouth every 6 (six) hours as needed (cough). 60 tablet 0  . ipratropium-albuterol (DUONEB) 0.5-2.5 (3) MG/3ML SOLN Take 3 mLs by nebulization every 4 (four) hours as needed. 360 mL 3  . labetalol (NORMODYNE) 100 MG tablet Take 100 mg by mouth daily.     [name] nystatin (MYCOSTATIN) 100000 UNIT/ML suspension Take 4 mLs (400,000 Units total) by mouth 4 (four) times daily. 60 mL 0  . pantoprazole (PROTONIX) 40 MG tablet Take 2 tablets (80 mg total) by mouth daily. (Patient taking differently: Take 40 mg by mouth 2 (two) times daily before a meal. ) 180 tablet 1  . Pitavastatin Calcium (LIVALO)  4 MG TABS Take 1 tablet (4 mg total) by mouth every other day. 90 tablet 3  . predniSONE (DELTASONE) 50 MG tablet Take one tablet for 5 days. 5 tablet 0  . pregabalin (LYRICA) 50 MG capsule TAKE ONE CAPSULE 3 TIMES A DAY (Patient taking differently: Take 50 mg by mouth daily. ) 270 capsule 1  . albuterol (PROVENTIL HFA;VENTOLIN HFA) 108 (90 Base) MCG/ACT inhaler Inhale 2 puffs into the lungs every 4 (four) hours as needed for wheezing or shortness of breath. (Patient not taking: Reported on 12/27/2016) 1 Inhaler 11   No current facility-administered medications for this visit.     REVIEW OF SYSTEMS:  [X]  denotes positive finding, [ ]  denotes negative finding Cardiac  Comments:  Chest pain or chest pressure:    Shortness of breath upon exertion:    Short of breath when lying flat:    Irregular heart rhythm:        Vascular    Pain in calf, thigh, or hip brought on by ambulation:    Pain in feet at night that wakes you up from your sleep:     Blood clot in your veins:    Leg swelling:         Pulmonary    Oxygen at home:    Productive cough:     Wheezing:         Neurologic    Sudden weakness in arms or legs:     Sudden numbness in arms or legs:     Sudden onset of difficulty speaking or slurred speech:    Temporary loss of vision in one eye:     Problems with dizziness:         Gastrointestinal    Blood in stool:     Vomited blood:         Genitourinary    Burning when urinating:     Blood in urine:        Psychiatric    Major depression:         Hematologic    Bleeding problems:    Problems with blood clotting too easily:        Skin    Rashes or ulcers:        Constitutional    Fever or chills:      PHYSICAL EXAM: Vitals:   12/27/16 0851 12/27/16 0852  BP: (!) 147/69 (!) 156/71  Pulse: 71   Resp: 18   Temp: 97.7 F (36.5 C)   TempSrc: Oral   SpO2: 90%   Weight: 116 lb 8 oz (52.8 kg)   Height: 5\' 2"  (1.575 m)     GENERAL: The patient is a  well-nourished female, in no acute distress. The vital signs are documented above. CARDIAC:  There is a regular rate and rhythm.  VASCULAR: I do not detect carotid bruits. On the left side, I cannot palpate femoral, popliteal, or pedal pulses. On the right side, I can palpate a femoral pulse and dorsalis pedis pulse. She has no significant lower extremity swelling. PULMONARY: There is good air exchange bilaterally without wheezing or rales. ABDOMEN: Soft and non-tender with normal pitched bowel sounds.  MUSCULOSKELETAL: There are no major deformities or cyanosis. NEUROLOGIC: No focal weakness or paresthesias are detected. SKIN: There are no ulcers or rashes noted. PSYCHIATRIC: The patient has a normal affect.  DATA:   ARTERIAL DOPPLER STUDY: I have reviewed her Doppler study that was done on 12/12/2016.  On the left side, which is the symptomatic side, there is a monophasic dorsalis pedis and posterior tibial signal. ABI 65%. To pressure on the left is 94 mmHg.  On the right side, there is a biphasic posterior tibial signal and a triphasic dorsalis pedis signal. ABI is 100%. To pressure on the right is 154 mmHg.  AORTOGRAM: I reviewed her aortogram from 2014. She has in occlusion of the left external iliac artery. She had no significant infrainguinal arterial occlusive disease at that time.  MEDICAL ISSUES:  PERIPHERAL VASCULAR DISEASE: This patient has a known chronic occlusion of the left external iliac artery. She has stable claudication on the left and is very motivated. She is not a smoker. She quit 6 years ago. I've encouraged her to continue to walk as much as possible. Her only option for revascularization would be a right to left femoral-femoral bypass. Given that her symptoms are stable I would not recommend this unless she developed disabling claudication, rest pain, or nonhealing ulcer. I've ordered follow up ABIs in 2 years and LC her back at that time. She knows to call sooner  if she has problems.   Deitra Mayo [facility] [phone]

## 2016-12-27 NOTE — Progress Notes (Signed)
   Subjective:    Patient ID: Catherine Phillips, female    DOB: 02/10/1946, 71 y.o.   MRN: [id number]  HPI  Pt is a 71 yo female with Pulmonary emphysema, renal artery stenosis, atherosclerosis who presents to the clinic for hospital follow up. She was admitted on 4/7 and discharged on 4/9 for COPD exacerbation with acute respiratory failure. She is doing great today. No problems breathing. She is on her last few days of prednisone and continues duoneb twice a day. She really gets a lot of benefit with duoneb. She remains on symbicort daily. Sugars were elevated in hospital but she was on a lot of prednisone. Potassium was also a little elevated.    Review of Systems  All other systems reviewed and are negative.      Objective:   Physical Exam  Constitutional: She is oriented to person, place, and time. She appears well-developed and well-nourished.  HENT:  Head: Normocephalic and atraumatic.  Cardiovascular: Normal rate, regular rhythm and normal heart sounds.   Pulmonary/Chest: Effort normal and breath sounds normal.  Neurological: She is alert and oriented to person, place, and time.  Psychiatric: She has a normal mood and affect. Her behavior is normal.          Assessment & Plan:  Marland KitchenMarland KitchenMegha was seen today for hospitalization follow-up.  Diagnoses and all orders for this visit:  COPD exacerbation ([place]) -     Tiotropium Bromide Monohydrate (SPIRIVA RESPIMAT) 2.5 MCG/ACT AERS; Inhale 2 puffs into the lungs daily.  Hyperkalemia -     Basic metabolic panel  Other orders -     Discontinue: Tiotropium Bromide Monohydrate (SPIRIVA RESPIMAT) 2.5 MCG/ACT AERS; Inhale 2 puffs into the lungs daily.  pulse ox great today. Lungs sound great.  Added spiriva to symbicort. Finish prednisone.  Continue to use duoneb as needed. Will recheck bmp early next week. Will monitor glucose.

## 2016-12-28 ENCOUNTER — Inpatient Hospital Stay: Payer: Medicare Other | Admitting: Physician Assistant

## 2016-12-28 NOTE — Addendum Note (Signed)
Addended by: Lianne Cure A on: 12/28/2016 09:56 AM   Modules accepted: Orders

## 2017-01-04 LAB — BASIC METABOLIC PANEL
BUN: 30 mg/dL — AB (ref 7–25)
CALCIUM: 8.8 mg/dL (ref 8.6–10.4)
CO2: 25 mmol/L (ref 20–31)
Chloride: 102 mmol/L (ref 98–110)
Creat: 1.36 mg/dL — ABNORMAL HIGH (ref 0.60–0.93)
GLUCOSE: 131 mg/dL — AB (ref 65–99)
Potassium: 4.7 mmol/L (ref 3.5–5.3)
SODIUM: 135 mmol/L (ref 135–146)

## 2017-01-04 NOTE — Progress Notes (Signed)
Call pt: creatine is coming back down to baseline from 10 days ago.

## 2017-03-29 ENCOUNTER — Ambulatory Visit (INDEPENDENT_AMBULATORY_CARE_PROVIDER_SITE_OTHER): Payer: Medicare Other

## 2017-03-29 DIAGNOSIS — R918 Other nonspecific abnormal finding of lung field: Secondary | ICD-10-CM

## 2017-03-29 DIAGNOSIS — I700 Atherosclerosis of aorta: Secondary | ICD-10-CM

## 2017-03-29 DIAGNOSIS — R591 Generalized enlarged lymph nodes: Secondary | ICD-10-CM | POA: Diagnosis not present

## 2017-03-29 DIAGNOSIS — J439 Emphysema, unspecified: Secondary | ICD-10-CM

## 2017-03-29 NOTE — Progress Notes (Signed)
Spoke with Pt's husband Patrecia Veiga about CT results.  Charles expressed understanding. Told that pt needs to make an ROV; appt made on 04/12/17 with TP at [facility] office. Nothing further needed at this time.

## 2017-04-12 ENCOUNTER — Ambulatory Visit (INDEPENDENT_AMBULATORY_CARE_PROVIDER_SITE_OTHER): Payer: Medicare Other | Admitting: Adult Health

## 2017-04-12 ENCOUNTER — Encounter: Payer: Self-pay | Admitting: Adult Health

## 2017-04-12 VITALS — BP 131/75 | HR 58 | Ht 62.0 in | Wt 118.0 lb

## 2017-04-12 DIAGNOSIS — R918 Other nonspecific abnormal finding of lung field: Secondary | ICD-10-CM | POA: Diagnosis not present

## 2017-04-12 DIAGNOSIS — J410 Simple chronic bronchitis: Secondary | ICD-10-CM

## 2017-04-12 DIAGNOSIS — I701 Atherosclerosis of renal artery: Secondary | ICD-10-CM

## 2017-04-12 NOTE — Patient Instructions (Addendum)
Keep up good work .  Continue on current regimen  CT chest in 1 year to follow lung nodules  Follow up Dr. Elsworth Soho  In 6 months and As needed

## 2017-04-12 NOTE — Assessment & Plan Note (Signed)
Waxing and waning lung ndoules. Residual nodules 6 mm LLL and 4 mm RUL and Stable pleural-parenchymal nodular thickening at the lung apices. Will follow up CT chest in 1 year.

## 2017-04-12 NOTE — Progress Notes (Signed)
@Patient  ID: Catherine Phillips, female    DOB: 12/14/1945, 71 y.o.   MRN: [id number]  Chief Complaint  Patient presents with  . Follow-up    COPD     Referring provider: Lavada Mesi  HPI: 71 yo female former smoker with moderate COPD (GOLD II)  And Lung nodules   TEST  10/2014 FEV1 60% CT chest 07/2014 - changes of ILD , stable. Mod emphysema, nodules decreased compared to 04/2014 except new one RUL CT chest 12/21/15 >Two 10 x 8 mm spiculated left lower lobe nodules, new from 2016 CT abdomen/pelvis.Additional 7 x 5 mm left upper lobe nodule, new from 2015 CT chest. CT chest 03/2016 >Waxing and waning pulmonary nodules most consistent with a benign inflammatory or infectious process.2. Centrilobular emphysema in the upper limits.. Stable pleural-parenchymal nodular thickening at the lung apices.  04/12/2017 Follow up : COPD /lung nodules  Pt returns for a 6 month follow up for COPD and Lung nodules  Says she is doing well overall .Feels good. 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  Stays active. She is on Symbicort and Sprivia . PVX and Prevnar 13 are utd.  Was admitted in April for COPD flare. Sx resolved with steroids .  Denies chest pain , orthopnea , hemoptysis or edema.   She has waxing and waning lung nodules dating back to 2015. Serial CT chest on 03/29/17 shows stable , decreased and resolved nodules c/w benign process. 65mm LLL nodule is stable. 4 mm RUL nodule stable.  Reviewed scans with pt and family and will follow in 1 year.   Allergies  Allergen Reactions  . Bactrim [Sulfamethoxazole-Trimethoprim] Other (See Comments)    hyperkalemia  . Codeine Nausea And Vomiting  . Levaquin [Levofloxacin In D5w] Itching and Rash    Immunization History  Administered Date(s) Administered  . Influenza Split 06/24/2012  . Influenza,inj,Quad PF,36+ Mos 06/26/2013, 06/11/2014, 05/31/2015, 06/28/2016  . Pneumococcal Conjugate-13 10/29/2014  . Pneumococcal  Polysaccharide-23 05/29/2012  . Tdap 06/12/2010    Past Medical History:  Diagnosis Date  . Anxiety 06/24/2012  . COPD (chronic obstructive pulmonary disease) ([place])   . Diverticulosis   . Emphysema 06/24/2012  . Emphysema of lung ([place])   . Essential hypertension 06/24/2012  . History of Left leg claudication 06/24/2012  . Hypertension   . Peripheral vascular disease ([place])   . Renal disorder    Renal Insufficiency R=30%  . Shortness of breath dyspnea     Tobacco History: History  Smoking Status  . Former Smoker  . Packs/day: 1.50  . Years: 30.00  . Types: Cigarettes  . Quit date: 12/30/2009  Smokeless Tobacco  . Never Used   Counseling given: Not Answered   Outpatient Encounter Prescriptions as of 04/12/2017  Medication Sig  . acetaminophen (TYLENOL) 500 MG tablet Take 1,000 mg by mouth every 6 (six) hours as needed (pain).  [name] albuterol (PROVENTIL HFA;VENTOLIN HFA) 108 (90 Base) MCG/ACT inhaler Inhale 2 puffs into the lungs every 4 (four) hours as needed for wheezing or shortness of breath.  [name] albuterol (PROVENTIL) (2.5 MG/3ML) 0.083% nebulizer solution   . amLODipine (NORVASC) 10 MG tablet Take 5 mg by mouth daily.  . budesonide-formoterol (SYMBICORT) 160-4.5 MCG/ACT inhaler Inhale 2 puffs into the lungs 2 (two) times daily. (Patient taking differently: Inhale 2 puffs into the lungs 2 (two) times daily as needed (shortness of breath/ wheezing). )  . ipratropium-albuterol (DUONEB) 0.5-2.5 (3) MG/3ML SOLN Take 3 mLs by nebulization every  4 (four) hours as needed.  . labetalol (NORMODYNE) 100 MG tablet Take 100 mg by mouth daily.   [name] nystatin (MYCOSTATIN) 100000 UNIT/ML suspension Take 4 mLs (400,000 Units total) by mouth 4 (four) times daily.  . Pitavastatin Calcium (LIVALO) 4 MG TABS Take 1 tablet (4 mg total) by mouth every other day.  . pregabalin (LYRICA) 50 MG capsule TAKE ONE CAPSULE 3 TIMES A DAY (Patient taking differently: Take 50 mg by mouth daily. )  . Tiotropium  Bromide Monohydrate (SPIRIVA RESPIMAT) 2.5 MCG/ACT AERS Inhale 2 puffs into the lungs daily.   No facility-administered encounter medications on file as of 04/12/2017.      Review of Systems  Constitutional:   No  weight loss, night sweats,  Fevers, chills, fatigue, or  lassitude.  HEENT:   No headaches,  Difficulty swallowing,  Tooth/dental problems, or  Sore throat,                No sneezing, itching, ear ache, nasal congestion, post nasal drip,   CV:  No chest pain,  Orthopnea, PND, swelling in lower extremities, anasarca, dizziness, palpitations, syncope.   GI  No heartburn, indigestion, abdominal pain, nausea, vomiting, diarrhea, change in bowel habits, loss of appetite, bloody stools.   Resp: No shortness of breath with exertion or at rest.  No excess mucus, no productive cough,  No non-productive cough,  No coughing up of blood.  No change in color of mucus.  No wheezing.  No chest wall deformity  Skin: no rash or lesions.  GU: no dysuria, change in color of urine, no urgency or frequency.  No flank pain, no hematuria   MS:  No joint pain or swelling.  No decreased range of motion.  No back pain.    Physical Exam  BP 131/75 (BP Location: Right Arm, Patient Position: Sitting, Cuff Size: Normal)   Pulse (!) 58   Ht 5\' 2"  (1.575 m)   Wt 118 lb (53.5 kg)   SpO2 92%   BMI 21.58 kg/m   GEN: A/Ox3; pleasant , NAD, well nourished    HEENT:  [place]/AT,  EACs-clear, TMs-wnl, NOSE-clear, THROAT-clear, no lesions, no postnasal drip or exudate noted.   NECK:  Supple w/ fair ROM; no JVD; normal carotid impulses w/o bruits; no thyromegaly or nodules palpated; no lymphadenopathy.    RESP  Clear  P & A; w/o, wheezes/ rales/ or rhonchi. no accessory muscle use, no dullness to percussion  CARD:  RRR, no m/r/g, no peripheral edema, pulses intact, no cyanosis or clubbing.  GI:   Soft & nt; nml bowel sounds; no organomegaly or masses detected.   Musco: Warm bil, no deformities or joint  swelling noted.   Neuro: alert, no focal deficits noted.    Skin: Warm, no lesions or rashes     Lab Results:   BMET   Imaging: Ct Chest Wo Contrast  Result Date: 03/29/2017 CLINICAL DATA:  71 year old female with history of pulmonary nodules. Followup study. EXAM: CT CHEST WITHOUT CONTRAST TECHNIQUE: Multidetector CT imaging of the chest was performed following the standard protocol without IV contrast. COMPARISON:  Chest CT 03/28/2016. FINDINGS: Cardiovascular: Heart size is normal. There is no significant pericardial fluid, thickening or pericardial calcification. There is aortic atherosclerosis, as well as atherosclerosis of the great vessels of the mediastinum and the coronary arteries, including calcified atherosclerotic plaque in the left main, left anterior descending and left circumflex coronary arteries. Mediastinum/Nodes: Enlarged low right paratracheal lymph node similar to the  prior study measuring 14 mm in short axis. No other pathologically enlarged mediastinal or hilar lymph nodes are noted. Please note that accurate exclusion of hilar adenopathy is limited on noncontrast CT scans. Esophagus is unremarkable in appearance. No axillary lymphadenopathy. Lungs/Pleura: Most of the previously noted pulmonary nodules seen on prior studies have resolved or decreased in size. 6 mm subpleural nodule in the left lower lobe (image 114 of series 3) is relatively stable compared to prior examinations. 4 mm subpleural nodule associated with the minor fissure in the inferior right upper lobe also unchanged (axial image 59 of series 3). No new suspicious appearing pulmonary nodules or masses are noted. Bilateral apical nodular and masslike pleuroparenchymal thickening, similar to prior studies, most compatible with chronic post infectious or inflammatory scarring. Chronic areas of post infectious or inflammatory scarring are also noted in the inferior segment of the lingula and medial segment of the  right middle lobe. Diffuse bronchial wall thickening with moderate centrilobular and paraseptal emphysema. No acute consolidative airspace disease. No pleural effusions. Upper Abdomen: Unremarkable. Musculoskeletal: There are no aggressive appearing lytic or blastic lesions noted in the visualized portions of the skeleton. IMPRESSION: 1. Previously noted pulmonary nodules are either stable, decreased in size or have resolved compared to prior studies, indicative of benign nodules. 2. Diffuse bronchial wall thickening with moderate centrilobular and paraseptal emphysema; imaging findings suggestive of underlying COPD. 3. Aortic atherosclerosis, in addition to left main and 2 vessel coronary artery disease. Please note that although the presence of coronary artery calcium documents the presence of coronary artery disease, the severity of this disease and any potential stenosis cannot be assessed on this non-gated CT examination. Assessment for potential risk factor modification, dietary therapy or pharmacologic therapy may be warranted, if clinically indicated. 4. Stable low right paratracheal lymphadenopathy, of uncertain etiology and significance, but presumably benign. Aortic Atherosclerosis (ICD10-I70.0) and Emphysema (ICD10-J43.9). Electronically Signed   By: Vinnie Langton M.D.   On: 03/29/2017 12:22     Assessment & Plan:   COPD (chronic obstructive pulmonary disease) ([place]) Compensated on present regimen  Plan  Patient Instructions  Keep up good work .  Continue on current regimen  CT chest in 1 year to follow lung nodules  Follow up Dr. Elsworth Soho  In 6 months and As needed      Pulmonary nodules Waxing and waning lung ndoules. Residual nodules 6 mm LLL and 4 mm RUL and Stable pleural-parenchymal nodular thickening at the lung apices. Will follow up CT chest in 1 year.       Rexene Edison, NP 04/12/2017

## 2017-04-12 NOTE — Assessment & Plan Note (Signed)
Compensated on present regimen  Plan  Patient Instructions  Keep up good work .  Continue on current regimen  CT chest in 1 year to follow lung nodules  Follow up Dr. Elsworth Soho  In 6 months and As needed

## 2017-06-04 ENCOUNTER — Other Ambulatory Visit: Payer: Self-pay | Admitting: Physician Assistant

## 2017-06-05 ENCOUNTER — Telehealth: Payer: Self-pay | Admitting: Physician Assistant

## 2017-06-05 MED ORDER — PREGABALIN 50 MG PO CAPS: 50.0000 mg | ORAL_CAPSULE | Freq: Two times a day (BID) | ORAL | 0 refills | Status: DC

## 2017-06-05 NOTE — Telephone Encounter (Signed)
Pt needs refill of lyrica. Will make 6 month appt in October.

## 2017-06-07 ENCOUNTER — Other Ambulatory Visit: Payer: Self-pay | Admitting: *Deleted

## 2017-06-07 MED ORDER — PREGABALIN 50 MG PO CAPS: 50.0000 mg | ORAL_CAPSULE | Freq: Two times a day (BID) | ORAL | 0 refills | Status: DC

## 2017-07-13 ENCOUNTER — Ambulatory Visit (INDEPENDENT_AMBULATORY_CARE_PROVIDER_SITE_OTHER): Payer: 59 | Admitting: Physician Assistant

## 2017-07-13 ENCOUNTER — Encounter: Payer: Self-pay | Admitting: Physician Assistant

## 2017-07-13 VITALS — BP 137/51 | HR 69 | Ht 62.0 in | Wt 120.0 lb

## 2017-07-13 DIAGNOSIS — E785 Hyperlipidemia, unspecified: Secondary | ICD-10-CM

## 2017-07-13 DIAGNOSIS — I70213 Atherosclerosis of native arteries of extremities with intermittent claudication, bilateral legs: Secondary | ICD-10-CM | POA: Diagnosis not present

## 2017-07-13 DIAGNOSIS — N183 Chronic kidney disease, stage 3 unspecified: Secondary | ICD-10-CM

## 2017-07-13 DIAGNOSIS — Z23 Encounter for immunization: Secondary | ICD-10-CM

## 2017-07-13 DIAGNOSIS — I701 Atherosclerosis of renal artery: Secondary | ICD-10-CM

## 2017-07-13 DIAGNOSIS — G629 Polyneuropathy, unspecified: Secondary | ICD-10-CM

## 2017-07-13 DIAGNOSIS — I10 Essential (primary) hypertension: Secondary | ICD-10-CM

## 2017-07-13 DIAGNOSIS — M25511 Pain in right shoulder: Secondary | ICD-10-CM | POA: Diagnosis not present

## 2017-07-13 MED ORDER — DICLOFENAC SODIUM 1 % TD GEL: 4.0000 g | Freq: Four times a day (QID) | TRANSDERMAL | 2 refills | Status: DC

## 2017-07-13 NOTE — Progress Notes (Signed)
Subjective:    Patient ID: Catherine Phillips, female    DOB: [birth year], 71 y.o.   MRN: [id number]  HPI Patient is a 71 year old female with hypertension, renal artery stenosis, hyperlipidemia, COPD, CKD 3 who presents to the clinic for follow-up.  Patient overall is doing very well.  She recently had a follow-up with pulmonology for her COPD.  She is well controlled.  Her pulmonary nodules were stable.  She had a nephrology appointment in May of this year with a 1 year follow-up.  Her serum creatinine was 1.33 and stable.  Her blood pressure was controlled.  She denies any shortness of breath, chest pains, palpitations, headaches or vision changes.  She does have hyperlipidemia and atherosclerosis of her lower extremities but she is on a statin.  She will need a refill in the future.  She is having some problems with her right shoulder for last few weeks.  She still works and has to lift things above her head.  The pain is not constant but intermittent.  Seems to bother her more in her anterior shoulder.  It is worse with lifting.  She denies any numbness or tingling running down her arms.  She denies any known injury.  She does take Tylenol and it helps.  She has no peripheral polyneuropathy.  Tends to affect her feet the most.  She takes Lyrica for this.  Her feet will get the flares of really intense pain and burning.  She wonders if there is anything else she can do.  She is already had orthotics made which made no difference.  .. Active Ambulatory Problems    Diagnosis Date Noted  . History of Left leg claudication 06/24/2012  . Pulmonary emphysema ([place]) 06/24/2012  . Essential hypertension 06/24/2012  . Anxiety 06/24/2012  . Hyperlipidemia LDL goal <70 06/26/2012  . Osteoporosis 09/04/2012  . GERD (gastroesophageal reflux disease) 10/04/2012  . Pain in limb 12/04/2012  . Atherosclerosis of native artery of extremity with intermittent claudication ([place]) 12/04/2012  . Peripheral  vascular disease ([place]) 01/22/2013  . COPD (chronic obstructive pulmonary disease) with emphysema Gold B 08/22/2013  . Peripheral neuropathy 10/28/2013  . Pulmonary nodules 04/28/2014  . Gastritis 12/03/2014  . Osteoarthritis, hand 04/19/2015  . COPD (chronic obstructive pulmonary disease) ([place]) 05/02/2015  . Leukocytosis 05/02/2015  . SOB (shortness of breath) 05/02/2015  . Dehydration 05/02/2015  . AKI (acute kidney injury) ([place]) 05/02/2015  . Epigastric abdominal pain 04/25/2016  . Hyperkalemia 05/22/2016  . Medication reaction 05/31/2016  . Renal artery stenosis ([place]) 06/21/2016  . Acute respiratory failure with hypoxia ([place]) 12/23/2016  . COPD exacerbation ([place]) 12/23/2016   Resolved Ambulatory Problems    Diagnosis Date Noted  . COPD exacerbation ([place]) 08/09/2014  . CAP (community acquired pneumonia) 08/09/2014  . COPD exacerbation ([place]) 05/02/2015   Past Medical History:  Diagnosis Date  . Anxiety 06/24/2012  . COPD (chronic obstructive pulmonary disease) ([place])   . Diverticulosis   . Emphysema 06/24/2012  . Emphysema of lung ([place])   . Essential hypertension 06/24/2012  . History of Left leg claudication 06/24/2012  . Hypertension   . Peripheral vascular disease ([place])   . Renal disorder   . Shortness of breath dyspnea      Review of Systems  All other systems reviewed and are negative.      Objective:   Physical Exam  Constitutional: She is oriented to person, place, and time. She appears well-developed and well-nourished.  HENT:  Head: Normocephalic and  atraumatic.  Neck: Normal range of motion. Neck supple.  Cardiovascular: Normal rate, regular rhythm and normal heart sounds.   Pulmonary/Chest: Effort normal and breath sounds normal.  Musculoskeletal:  Right shoulder: NROM Strength of upper extermity 5/5.  Positive hawkins.  Negative empty can.  Mild tenderness to palpation over anterior right shoulder.  Hand grip 5/5.   Lymphadenopathy:    She has no  cervical adenopathy.  Neurological: She is alert and oriented to person, place, and time.  Psychiatric: She has a normal mood and affect. Her behavior is normal.          Assessment & Plan:  Marland KitchenMarland KitchenJavionna was seen today for follow-up.  Diagnoses and all orders for this visit:  Essential hypertension -     COMPLETE METABOLIC PANEL WITH GFR  Need for immunization against influenza -     Flu vaccine HIGH DOSE PF  Renal artery stenosis (HCC) -     COMPLETE METABOLIC PANEL WITH GFR  Atherosclerosis of native artery of both lower extremities with intermittent claudication (HCC) -     Lipid Panel w/reflex Direct LDL  Hyperlipidemia LDL goal <70 -     Lipid Panel w/reflex Direct LDL  Peripheral polyneuropathy -     COMPLETE METABOLIC PANEL WITH GFR -     diclofenac sodium (VOLTAREN) 1 % GEL; Apply 4 g topically 4 (four) times daily. To affected joint.  CKD (chronic kidney disease) stage 3, GFR 30-59 ml/min (HCC) -     COMPLETE METABOLIC PANEL WITH GFR  Acute pain of right shoulder -     diclofenac sodium (VOLTAREN) 1 % GEL; Apply 4 g topically 4 (four) times daily. To affected joint.   Blood pressure is well controlled today.  She did not need any refills sent today.  Last labs for her renal function were done in May.  Since I am making some medication changes today I want to make sure kidney function stays regulated.  She will get a CMP done after 4 weeks of medication changes.  I am going to increase Lyrica today to see if her neuropathy improves.  Increase to 100 mg twice a day.  After examination I suspect patient does have some shoulder bursitis.  I did offer her an injection today.  She declined.  She is not a candidate for oral anti-inflammatories due to her CKD 3.  We will try Voltaren gel and exercises.  She will have her renal function rechecked in 1 month.  Only use Voltaren gel as needed.  Discussed icing the shoulder as well.  Also discussed rest.  If she can avoid  overhead lifting at work symptoms should improve.  Cholesterol panel ordered today so that when she needs her Livalo we could refill this.

## 2017-07-13 NOTE — Patient Instructions (Addendum)
Increase lyrica to 50mg  2 tablets in morning 1 tablet in the evening. Can increase up to 2 tablets twice a day.   Get labs in one month.   Peripheral Neuropathy Peripheral neuropathy is a type of nerve damage. It affects nerves that carry signals between the spinal cord and other parts of the body. These are called peripheral nerves. With peripheral neuropathy, one nerve or a group of nerves may be damaged. What are the causes? Many things can damage peripheral nerves. For some people with peripheral neuropathy, the cause is unknown. Some causes include:  Diabetes. This is the most common cause of peripheral neuropathy.  Injury to a nerve.  Pressure or stress on a nerve that lasts a long time.  Too little vitamin B. Alcoholism can lead to this.  Infections.  Autoimmune diseases, such as multiple sclerosis and systemic lupus erythematosus.  Inherited nerve diseases.  Some medicines, such as cancer drugs.  Toxic substances, such as lead and mercury.  Too little blood flowing to the legs.  Kidney disease.  Thyroid disease.  What are the signs or symptoms? Different people have different symptoms. The symptoms you have will depend on which of your nerves is damaged. Common symptoms include:  Loss of feeling (numbness) in the feet and hands.  Tingling in the feet and hands.  Pain that burns.  Very sensitive skin.  Weakness.  Not being able to move a part of the body (paralysis).  Muscle twitching.  Clumsiness or poor coordination.  Loss of balance.  Not being able to control your bladder.  Feeling dizzy.  Sexual problems.  How is this diagnosed? Peripheral neuropathy is a symptom, not a disease. Finding the cause of peripheral neuropathy can be hard. To figure that out, your health care provider will take a medical history and do a physical exam. A neurological exam will also be done. This involves checking things affected by your brain, spinal cord, and nerves  (nervous system). For example, your health care provider will check your reflexes, how you move, and what you can feel. Other types of tests may also be ordered, such as:  Blood tests.  A test of the fluid in your spinal cord.  Imaging tests, such as CT scans or an MRI.  Electromyography (EMG). This test checks the nerves that control muscles.  Nerve conduction velocity tests. These tests check how fast messages pass through your nerves.  Nerve biopsy. A small piece of nerve is removed. It is then checked under a microscope.  How is this treated?  Medicine is often used to treat peripheral neuropathy. Medicines may include: ? Pain-relieving medicines. Prescription or over-the-counter medicine may be suggested. ? Antiseizure medicine. This may be used for pain. ? Antidepressants. These also may help ease pain from neuropathy. ? Lidocaine. This is a numbing medicine. You might wear a patch or be given a shot. ? Mexiletine. This medicine is typically used to help control irregular heart rhythms.  Surgery. Surgery may be needed to relieve pressure on a nerve or to destroy a nerve that is causing pain.  Physical therapy to help movement.  Assistive devices to help movement. Follow these instructions at home:  Only take over-the-counter or prescription medicines as directed by your health care provider. Follow the instructions carefully for any given medicines. Do not take any other medicines without first getting approval from your health care provider.  If you have diabetes, work closely with your health care provider to keep your blood sugar under  control.  If you have numbness in your feet: ? Check every day for signs of injury or infection. Watch for redness, warmth, and swelling. ? Wear padded socks and comfortable shoes. These help protect your feet.  Do not do things that put pressure on your damaged nerve.  Do not smoke. Smoking keeps blood from getting to damaged  nerves.  Avoid or limit alcohol. Too much alcohol can cause a lack of B vitamins. These vitamins are needed for healthy nerves.  Develop a good support system. Coping with peripheral neuropathy can be stressful. Talk to a mental health specialist or join a support group if you are struggling.  Follow up with your health care provider as directed. Contact a health care provider if:  You have new signs or symptoms of peripheral neuropathy.  You are struggling emotionally from dealing with peripheral neuropathy.  You have a fever. Get help right away if:  You have an injury or infection that is not healing.  You feel very dizzy or begin vomiting.  You have chest pain.  You have trouble breathing. This information is not intended to replace advice given to you by your health care provider. Make sure you discuss any questions you have with your health care provider. Document Released: 08/25/2002 Document Revised: 02/10/2016 Document Reviewed: 05/12/2013 Elsevier Interactive Patient Education  2017 [employer].

## 2017-07-19 DIAGNOSIS — E785 Hyperlipidemia, unspecified: Secondary | ICD-10-CM | POA: Diagnosis not present

## 2017-07-19 DIAGNOSIS — I70213 Atherosclerosis of native arteries of extremities with intermittent claudication, bilateral legs: Secondary | ICD-10-CM | POA: Diagnosis not present

## 2017-07-19 DIAGNOSIS — N183 Chronic kidney disease, stage 3 (moderate): Secondary | ICD-10-CM | POA: Diagnosis not present

## 2017-07-19 DIAGNOSIS — I701 Atherosclerosis of renal artery: Secondary | ICD-10-CM | POA: Diagnosis not present

## 2017-07-19 DIAGNOSIS — G629 Polyneuropathy, unspecified: Secondary | ICD-10-CM | POA: Diagnosis not present

## 2017-07-19 DIAGNOSIS — I10 Essential (primary) hypertension: Secondary | ICD-10-CM | POA: Diagnosis not present

## 2017-07-19 LAB — COMPLETE METABOLIC PANEL WITH GFR
AG RATIO: 1.9 (calc) (ref 1.0–2.5)
ALBUMIN MSPROF: 4.2 g/dL (ref 3.6–5.1)
ALT: 9 U/L (ref 6–29)
AST: 14 U/L (ref 10–35)
Alkaline phosphatase (APISO): 41 U/L (ref 33–130)
BILIRUBIN TOTAL: 0.5 mg/dL (ref 0.2–1.2)
BUN / CREAT RATIO: 19 (calc) (ref 6–22)
BUN: 27 mg/dL — AB (ref 7–25)
CHLORIDE: 107 mmol/L (ref 98–110)
CO2: 23 mmol/L (ref 20–32)
CREATININE: 1.41 mg/dL — AB (ref 0.60–0.93)
Calcium: 9.4 mg/dL (ref 8.6–10.4)
GFR, Est African American: 43 mL/min/{1.73_m2} — ABNORMAL LOW (ref >=60)
GFR, Est Non African American: 37 mL/min/{1.73_m2} — ABNORMAL LOW (ref >=60)
Globulin: 2.2 g/dL (calc) (ref 1.9–3.7)
Glucose, Bld: 92 mg/dL (ref 65–99)
Potassium: 4.7 mmol/L (ref 3.5–5.3)
Sodium: 140 mmol/L (ref 135–146)
TOTAL PROTEIN: 6.4 g/dL (ref 6.1–8.1)

## 2017-07-19 LAB — LIPID PANEL W/REFLEX DIRECT LDL
Cholesterol: 212 mg/dL — ABNORMAL HIGH (ref <200)
HDL: 47 mg/dL — AB (ref >50)
LDL Cholesterol (Calc): 130 mg/dL (calc) — ABNORMAL HIGH
Non-HDL Cholesterol (Calc): 165 mg/dL (calc) — ABNORMAL HIGH (ref <130)
TRIGLYCERIDES: 210 mg/dL — AB (ref <150)
Total CHOL/HDL Ratio: 4.5 (calc) (ref <5.0)

## 2017-08-12 ENCOUNTER — Other Ambulatory Visit: Payer: Self-pay | Admitting: Physician Assistant

## 2017-08-12 DIAGNOSIS — J441 Chronic obstructive pulmonary disease with (acute) exacerbation: Secondary | ICD-10-CM

## 2017-08-14 ENCOUNTER — Other Ambulatory Visit: Payer: Self-pay | Admitting: *Deleted

## 2017-08-14 DIAGNOSIS — J441 Chronic obstructive pulmonary disease with (acute) exacerbation: Secondary | ICD-10-CM

## 2017-08-14 MED ORDER — TIOTROPIUM BROMIDE MONOHYDRATE 2.5 MCG/ACT IN AERS: INHALATION_SPRAY | RESPIRATORY_TRACT | 3 refills | Status: DC

## 2017-08-24 ENCOUNTER — Other Ambulatory Visit: Payer: Self-pay | Admitting: Physician Assistant

## 2017-08-28 ENCOUNTER — Other Ambulatory Visit: Payer: Self-pay | Admitting: *Deleted

## 2017-08-28 MED ORDER — PREGABALIN 100 MG PO CAPS: 50.0000 mg | ORAL_CAPSULE | Freq: Two times a day (BID) | ORAL | 0 refills | Status: DC

## 2017-09-10 IMAGING — CT CT ABD-PELV W/ CM
2 of 5 series · 16 of 46 positions shown, 18 images · IV contrast (omnipaque)
Comparison: CT of the abdomen and pelvis from 07/11/2014

CLINICAL DATA: Acute onset of left upper quadrant abdominal pain,
with increased urinary frequency, dysuria, nausea and diarrhea.
Initial encounter.

EXAM:
CT ABDOMEN AND PELVIS WITH CONTRAST
TECHNIQUE: Multidetector CT imaging of the abdomen and pelvis was performed
using the standard protocol following bolus administration of
intravenous contrast.
CONTRAST:  100mL OMNIPAQUE IOHEXOL 300 MG/ML  SOLN

[Series 2: abd/pelvis 5.0 b31f · axial · 0.66mm/px · z∈[-404,-14]mm · 13 of 88 slices shown, 15 images]
[im 5/88  soft-tissue]
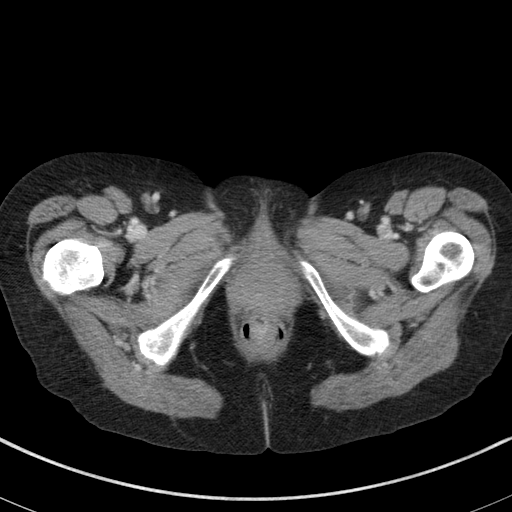
[im 5/88  bone]
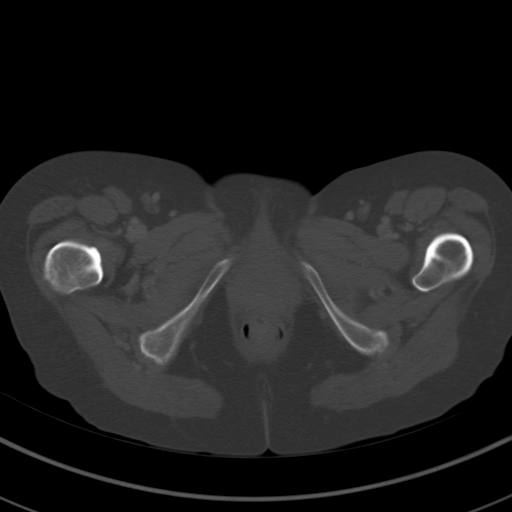
[im 14/88  soft-tissue]
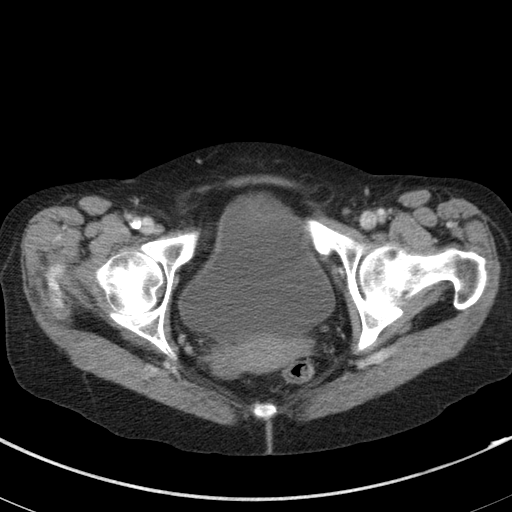
[im 19/88  soft-tissue]
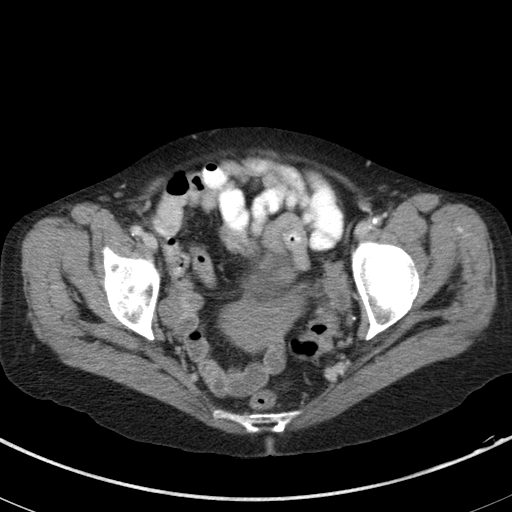
[im 23/88  soft-tissue]
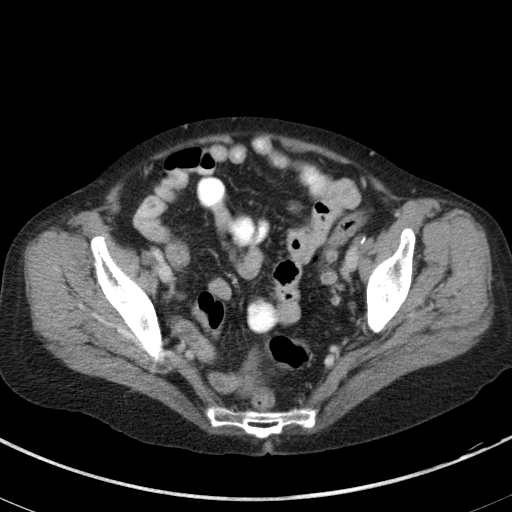
[im 33/88  soft-tissue]
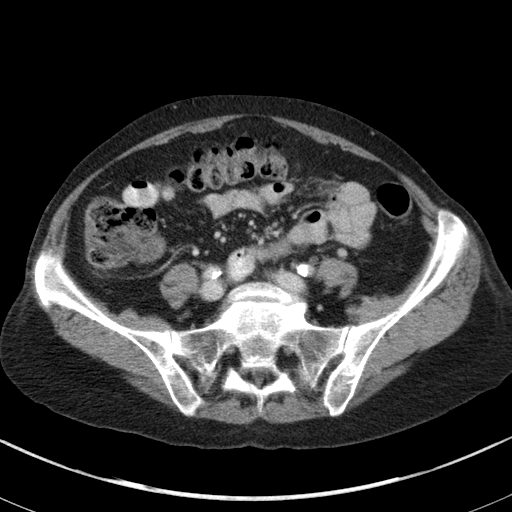
[im 37/88  soft-tissue]
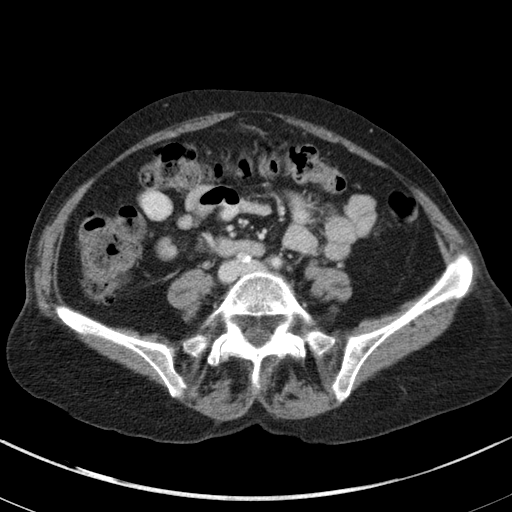
[im 46/88  soft-tissue]
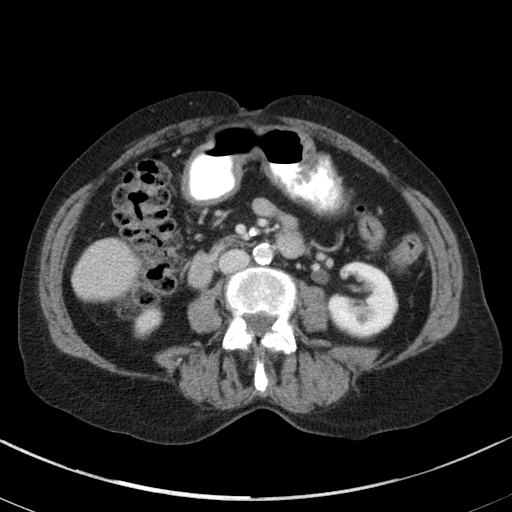
[im 51/88  soft-tissue]
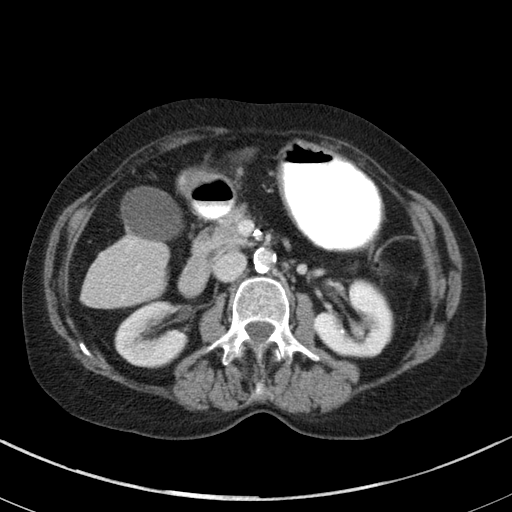
[im 55/88  soft-tissue]
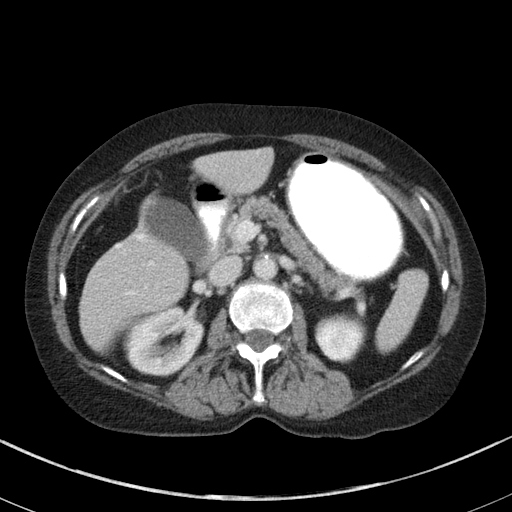
[im 55/88  bone]
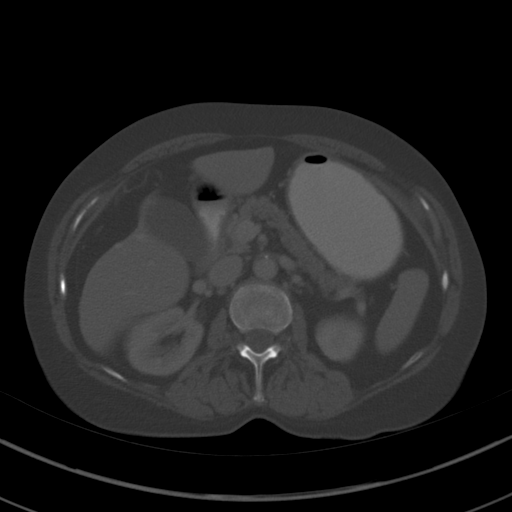
[im 65/88  soft-tissue]
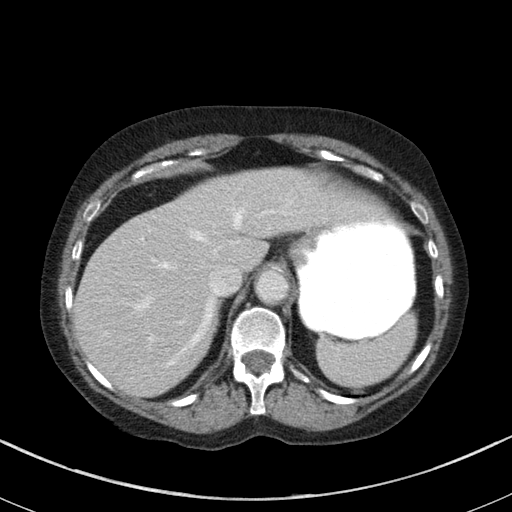
[im 69/88  soft-tissue]
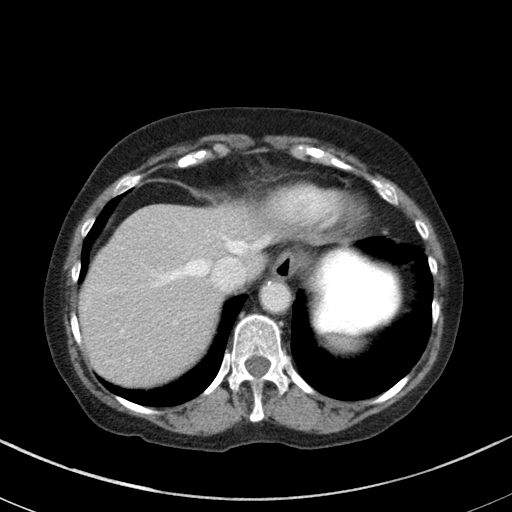
[im 74/88  soft-tissue]
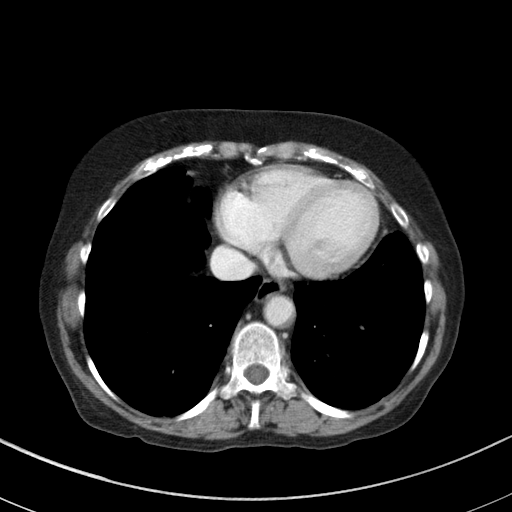
[im 83/88  soft-tissue]
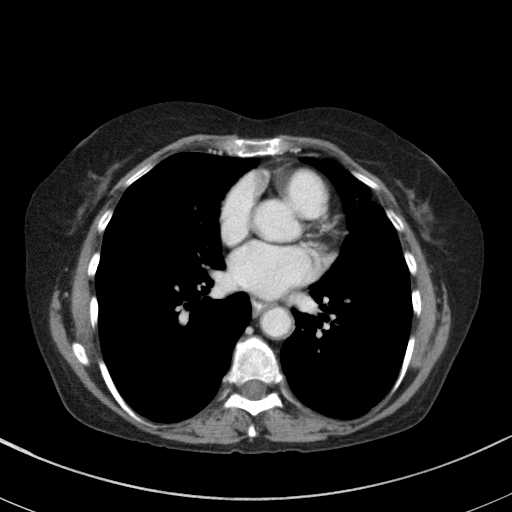

[Series 5: abd/pelvis 3.0 coronal · coronal · 0.79mm/px · 3 of 72 slices shown]
[im 24/72  soft-tissue]
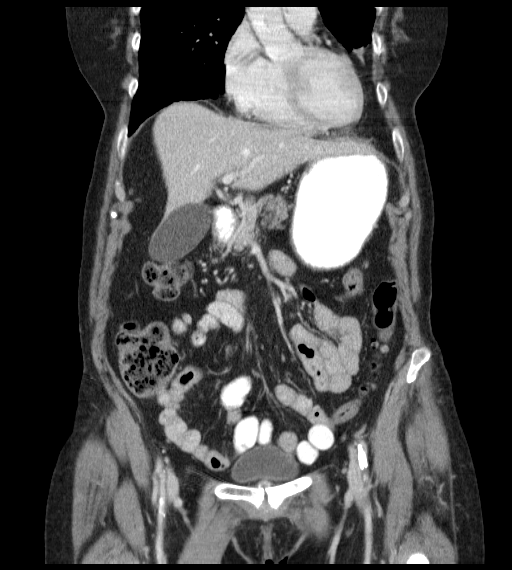
[im 32/72  soft-tissue]
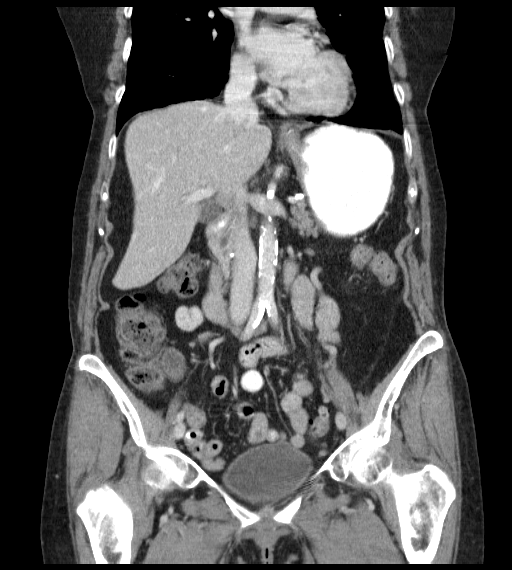
[im 40/72  soft-tissue]
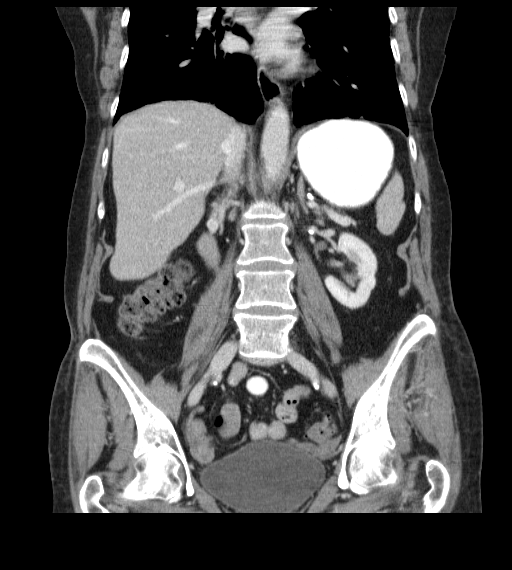

[16 of 46 positions shown; findings below may reference images not displayed]

FINDINGS: Bilateral emphysematous change is noted. Mild scarring is seen at
the left lingula. Mild calcification is noted along the ascending
thoracic aorta.

The liver and spleen are unremarkable in appearance. The gallbladder
is within normal limits. The pancreas and adrenal glands are
unremarkable.

Small bilateral parenchymal calcifications are seen. Left renal
cysts measure up to 7 mm in size. The kidneys are otherwise
unremarkable. There is no evidence of hydronephrosis. No renal or
ureteral stones are seen. No perinephric stranding is appreciated.

No free fluid is identified. The small bowel is unremarkable in
appearance. The stomach is within normal limits. No acute vascular
abnormalities are seen. Diffuse calcification is noted along the
abdominal aorta and its branches, including along the superior
mesenteric artery and both renal arteries.

The appendix is normal in caliber, without evidence of appendicitis.
Scattered diverticulosis is noted along the proximal sigmoid colon,
without evidence of diverticulitis.

The bladder is mildly distended and grossly unremarkable. The uterus
is unremarkable in appearance. No suspicious adnexal masses are
seen. The ovaries are grossly symmetric. No inguinal lymphadenopathy
is seen.

No acute osseous abnormalities are identified. There is grade 2
anterolisthesis of L5 on S1, reflecting mild underlying facet
disease.
IMPRESSION: 1. No acute abnormality seen to explain the patient's symptoms.
2. Diffuse calcification along the abdominal aorta and its branches,
including along the superior mesenteric artery and both renal
arteries.
3. Small left renal cysts noted.
4. Bilateral emphysematous change seen at the lung bases, with mild
scarring at the left lingula.
5. Scattered diverticulosis along the proximal sigmoid colon,
without evidence of diverticulitis.

## 2017-09-27 DIAGNOSIS — N183 Chronic kidney disease, stage 3 (moderate): Secondary | ICD-10-CM | POA: Diagnosis not present

## 2017-09-27 DIAGNOSIS — I10 Essential (primary) hypertension: Secondary | ICD-10-CM | POA: Diagnosis not present

## 2017-09-27 DIAGNOSIS — I701 Atherosclerosis of renal artery: Secondary | ICD-10-CM | POA: Diagnosis not present

## 2017-10-09 ENCOUNTER — Other Ambulatory Visit: Payer: Self-pay | Admitting: Physician Assistant

## 2017-10-09 DIAGNOSIS — B9689 Other specified bacterial agents as the cause of diseases classified elsewhere: Secondary | ICD-10-CM

## 2017-10-09 DIAGNOSIS — J329 Chronic sinusitis, unspecified: Principal | ICD-10-CM

## 2017-10-29 ENCOUNTER — Ambulatory Visit (INDEPENDENT_AMBULATORY_CARE_PROVIDER_SITE_OTHER): Payer: Medicare Other | Admitting: Physician Assistant

## 2017-10-29 ENCOUNTER — Encounter: Payer: Self-pay | Admitting: Physician Assistant

## 2017-10-29 VITALS — BP 140/53 | HR 70 | Temp 98.1°F | Ht 62.0 in | Wt 117.0 lb

## 2017-10-29 DIAGNOSIS — J441 Chronic obstructive pulmonary disease with (acute) exacerbation: Secondary | ICD-10-CM

## 2017-10-29 DIAGNOSIS — J40 Bronchitis, not specified as acute or chronic: Secondary | ICD-10-CM | POA: Diagnosis not present

## 2017-10-29 DIAGNOSIS — J329 Chronic sinusitis, unspecified: Secondary | ICD-10-CM | POA: Diagnosis not present

## 2017-10-29 MED ORDER — PREDNISONE 50 MG PO TABS: ORAL_TABLET | ORAL | 0 refills | Status: DC

## 2017-10-29 MED ORDER — AZITHROMYCIN 250 MG PO TABS: ORAL_TABLET | ORAL | 0 refills | Status: DC

## 2017-10-29 NOTE — Progress Notes (Signed)
Subjective:    Patient ID: Catherine Phillips, female    DOB: [birth year], 72 y.o.   MRN: [id number]  HPI  Pt is a 72 yo female with COPD, hx of acute respiratory failure, HTN who presents to the clinic with SOB, sinus pressure, sinus congestion and cough. Symptoms started 3 days ago and have continued to worsen. She feels the need to use her O2 given to use at night during the day. She is taking spiriva and symbicort daily. Nebulizer does give her relief and she uses once or twice a day. No fever. She has had some chills. Cough is very productive. She is taking mucinex.   .. Active Ambulatory Problems    Diagnosis Date Noted  . History of Left leg claudication 06/24/2012  . Pulmonary emphysema ([place]) 06/24/2012  . Essential hypertension 06/24/2012  . Anxiety 06/24/2012  . Hyperlipidemia LDL goal <70 06/26/2012  . Osteoporosis 09/04/2012  . GERD (gastroesophageal reflux disease) 10/04/2012  . Pain in limb 12/04/2012  . Atherosclerosis of native artery of extremity with intermittent claudication ([place]) 12/04/2012  . Peripheral vascular disease ([place]) 01/22/2013  . COPD (chronic obstructive pulmonary disease) with emphysema Gold B 08/22/2013  . Peripheral neuropathy 10/28/2013  . Pulmonary nodules 04/28/2014  . Gastritis 12/03/2014  . Osteoarthritis, hand 04/19/2015  . COPD (chronic obstructive pulmonary disease) ([place]) 05/02/2015  . Leukocytosis 05/02/2015  . SOB (shortness of breath) 05/02/2015  . Dehydration 05/02/2015  . AKI (acute kidney injury) ([place]) 05/02/2015  . Epigastric abdominal pain 04/25/2016  . Hyperkalemia 05/22/2016  . Medication reaction 05/31/2016  . Renal artery stenosis ([place]) 06/21/2016  . Acute respiratory failure with hypoxia ([place]) 12/23/2016  . COPD exacerbation ([place]) 12/23/2016   Resolved Ambulatory Problems    Diagnosis Date Noted  . COPD exacerbation ([place]) 08/09/2014  . CAP (community acquired pneumonia) 08/09/2014  . COPD exacerbation ([place]) 05/02/2015    Past Medical History:  Diagnosis Date  . Anxiety 06/24/2012  . COPD (chronic obstructive pulmonary disease) ([place])   . Diverticulosis   . Emphysema 06/24/2012  . Emphysema of lung ([place])   . Essential hypertension 06/24/2012  . History of Left leg claudication 06/24/2012  . Hypertension   . Peripheral vascular disease ([place])   . Renal disorder   . Shortness of breath dyspnea      Review of Systems See HPI.     Objective:   Physical Exam  Constitutional: She is oriented to person, place, and time. She appears well-developed and well-nourished.  HENT:  Head: Normocephalic and atraumatic.  Right Ear: External ear normal.  Left Ear: External ear normal.  TM's erythematous.  Tenderness over maxillary and nasal bridge.  Oropharynx erythematous. No tonsil enlargement or exudate.   Eyes: Conjunctivae are normal. Right eye exhibits no discharge. Left eye exhibits no discharge.  Neck: Normal range of motion. Neck supple.  Cardiovascular: Normal rate, regular rhythm and normal heart sounds.  Pulmonary/Chest: Effort normal.  Distant breath sounds.   Lymphadenopathy:    She has no cervical adenopathy.  Neurological: She is alert and oriented to person, place, and time.  Psychiatric: She has a normal mood and affect. Her behavior is normal.          Assessment & Plan:  Marland KitchenMarland KitchenLennette was seen today for cough and sinus pressure.  Diagnoses and all orders for this visit:  COPD exacerbation ([place]) -     predniSONE (DELTASONE) 50 MG tablet; Take one tablet daily for 5 days.  Sinobronchitis -  azithromycin (ZITHROMAX) 250 MG tablet; Take 2 tablets now and then one tablet for 4 days.   Pulse ox dropped from baseline at rest she is 90 percent but with exertion below 90. Baseline is 93-94 percent. Treating her with zpak, prednisone. Discussed increasing nebulizer treatment to every 4 hours for the next 2-3 days. Written out of work tomorrow. Rest and hydrate. Follow up if not improving. Ok  to use O2 at home if pulse ox is staying around 90 and most definitely below 90.

## 2017-10-30 ENCOUNTER — Encounter: Payer: Self-pay | Admitting: Physician Assistant

## 2017-10-31 ENCOUNTER — Other Ambulatory Visit: Payer: Self-pay | Admitting: Physician Assistant

## 2017-11-26 ENCOUNTER — Other Ambulatory Visit: Payer: Self-pay | Admitting: Physician Assistant

## 2017-11-27 ENCOUNTER — Other Ambulatory Visit: Payer: Self-pay | Admitting: Physician Assistant

## 2018-02-26 ENCOUNTER — Encounter: Payer: Self-pay | Admitting: Physician Assistant

## 2018-02-26 ENCOUNTER — Ambulatory Visit (INDEPENDENT_AMBULATORY_CARE_PROVIDER_SITE_OTHER): Payer: Medicare Other | Admitting: Physician Assistant

## 2018-02-26 VITALS — BP 132/50 | HR 80 | Temp 98.8°F | Ht 62.0 in | Wt 116.0 lb

## 2018-02-26 DIAGNOSIS — J441 Chronic obstructive pulmonary disease with (acute) exacerbation: Secondary | ICD-10-CM

## 2018-02-26 DIAGNOSIS — J439 Emphysema, unspecified: Secondary | ICD-10-CM

## 2018-02-26 DIAGNOSIS — R0902 Hypoxemia: Secondary | ICD-10-CM

## 2018-02-26 DIAGNOSIS — J9601 Acute respiratory failure with hypoxia: Secondary | ICD-10-CM

## 2018-02-26 MED ORDER — METHYLPREDNISOLONE SODIUM SUCC 125 MG IJ SOLR
125.0000 mg | Freq: Once | INTRAMUSCULAR | Status: AC
  Administered 2018-02-26: 125 mg via INTRAMUSCULAR

## 2018-02-26 MED ORDER — AZITHROMYCIN 250 MG PO TABS: ORAL_TABLET | ORAL | 0 refills | Status: DC

## 2018-02-26 MED ORDER — PREDNISONE 20 MG PO TABS: ORAL_TABLET | ORAL | 0 refills | Status: DC

## 2018-02-26 NOTE — Progress Notes (Addendum)
Subjective:    Patient ID: Catherine Phillips, female    DOB: May 25, 1946, 72 y.o.   MRN: [id number]  HPI Patient is a 72 yo female with COPD, hx of acute respiratory failure, and HTN who presents to the clinic with increasing SHOB, sinus pressure and congestion, sore throat, and non-productive cough. Symptoms began on Saturday - 3 days ago. She works at [employer] and may have been exposed to infection there. She also attended her granddaughters graduation at which she was exposed to cold and rain. She has used Tylenol at home with no relief. She has used her inhalers which have improved symptoms some. She has not used her nebulizer. Overall her symptoms have been worsening. Her SHOB is worsened with activity. She has reduced appetite and increased fatigue. She is staying hydrated. She uses 2 pillows to prop up at night to sleep and this has not changed with her current symptoms. She admits to some chills and sweating but denies fever.  She states her pulse oximetry is usually at 90% on room air at home.    Review of Systems  All other systems reviewed and are negative.      Objective:   Physical Exam  Constitutional: She appears well-developed and well-nourished.  Cardiovascular: Normal rate and regular rhythm.  Pulmonary/Chest: No stridor. She is in respiratory distress. She has no wheezes. She has no rales.  Increased effort and breath sounds quiet  Skin: Capillary refill takes less than 2 seconds. She is not diaphoretic.    .. Active Ambulatory Problems    Diagnosis Date Noted  . History of Left leg claudication 06/24/2012  . Pulmonary emphysema ([place]) 06/24/2012  . Essential hypertension 06/24/2012  . Anxiety 06/24/2012  . Hyperlipidemia LDL goal <70 06/26/2012  . Osteoporosis 09/04/2012  . GERD (gastroesophageal reflux disease) 10/04/2012  . Pain in limb 12/04/2012  . Atherosclerosis of native artery of extremity with intermittent claudication ([place]) 12/04/2012  . Peripheral  vascular disease ([place]) 01/22/2013  . COPD (chronic obstructive pulmonary disease) with emphysema Gold B 08/22/2013  . Peripheral neuropathy 10/28/2013  . Pulmonary nodules 04/28/2014  . Gastritis 12/03/2014  . Osteoarthritis, hand 04/19/2015  . COPD (chronic obstructive pulmonary disease) ([place]) 05/02/2015  . Leukocytosis 05/02/2015  . SOB (shortness of breath) 05/02/2015  . Dehydration 05/02/2015  . AKI (acute kidney injury) ([place]) 05/02/2015  . Epigastric abdominal pain 04/25/2016  . Hyperkalemia 05/22/2016  . Medication reaction 05/31/2016  . Renal artery stenosis ([place]) 06/21/2016  . Acute respiratory failure with hypoxia ([place]) 12/23/2016  . COPD exacerbation ([place]) 12/23/2016  . Hypoxia 03/01/2018   Resolved Ambulatory Problems    Diagnosis Date Noted  . COPD exacerbation ([place]) 08/09/2014  . CAP (community acquired pneumonia) 08/09/2014  . COPD exacerbation ([place]) 05/02/2015   Past Medical History:  Diagnosis Date  . Anxiety 06/24/2012  . COPD (chronic obstructive pulmonary disease) ([place])   . Diverticulosis   . Emphysema 06/24/2012  . Emphysema of lung ([place])   . Essential hypertension 06/24/2012  . History of Left leg claudication 06/24/2012  . Hypertension   . Peripheral vascular disease ([place])   . Renal disorder   . Shortness of breath dyspnea    .[name] Vitals:   02/26/18 1131  BP: (!) 132/50  Pulse: 80  Temp: 98.8 F (37.1 C)  SpO2: (!) 83%       Assessment & Plan:  Marland KitchenMarland KitchenDiagnoses and all orders for this visit:  Acute respiratory failure with hypoxia (HCC) -     predniSONE (  DELTASONE) 20 MG tablet; Take 3 tablets for 3 days, take 2 tablets for 3 days, take 1 tablet for 3 days, take 1/2 tablet for 4 days.  COPD exacerbation (HCC) -     azithromycin (ZITHROMAX) 250 MG tablet; Take 2 tablets now and then one tablet for 4 days. -     predniSONE (DELTASONE) 20 MG tablet; Take 3 tablets for 3 days, take 2 tablets for 3 days, take 1 tablet for 3 days, take 1/2 tablet for 4  days. -     methylPREDNISolone sodium succinate (SOLU-MEDROL) 125 mg/2 mL injection 125 mg  Pulmonary emphysema, unspecified emphysema type (HCC) -     predniSONE (DELTASONE) 20 MG tablet; Take 3 tablets for 3 days, take 2 tablets for 3 days, take 1 tablet for 3 days, take 1/2 tablet for 4 days.  Hypoxia    Oxygen given in the room improved her symptoms. Pulse ox at 83% on room air, 92% with 2 lpm O2.  Given her severity of symptoms and frequency of exacerbations supplemental O2 at home necessary to improve patient quality of life and reduce exacerbation severity. Order sent to APP. Return precautions given. Solumedrol shot given IM today then transition to oral prednisone. Start zpak as well. Written out of work for next 2-4 days. Pt has pulse ox at home and will continue to monitor and let me know if symptoms worsen. Encouraged to start nebulizer every 2-4 hours as needed.   [name].Spent 30 minutes with patient and greater than 50 percent of visit spent counseling patient regarding treatment plan.

## 2018-02-27 ENCOUNTER — Other Ambulatory Visit: Payer: Self-pay | Admitting: Physician Assistant

## 2018-02-27 NOTE — Telephone Encounter (Signed)
[facility] requesting refill on Lyrica.   Last RX written 11-28-17 for #180, no RF.  RX pended, please review and send if appropriate.  Thanks!

## 2018-03-01 DIAGNOSIS — G4734 Idiopathic sleep related nonobstructive alveolar hypoventilation: Secondary | ICD-10-CM | POA: Insufficient documentation

## 2018-03-01 MED ORDER — AMBULATORY NON FORMULARY MEDICATION: 0 refills | Status: DC

## 2018-03-01 NOTE — Addendum Note (Signed)
Addended by: Donella Stade on: 03/01/2018 04:32 PM   Modules accepted: Orders

## 2018-03-05 ENCOUNTER — Telehealth: Payer: Self-pay

## 2018-03-05 NOTE — Telephone Encounter (Signed)
I called patient and spoke with her husband. I advised him to have her call back to schedule an appointment for a walk test when she is feeling better. Also that the overnight 02 was placed. Placed the form in patient paperwork at Amber's/Jade's desk.

## 2018-03-07 ENCOUNTER — Encounter: Payer: Self-pay | Admitting: Physician Assistant

## 2018-03-08 DIAGNOSIS — J449 Chronic obstructive pulmonary disease, unspecified: Secondary | ICD-10-CM | POA: Diagnosis not present

## 2018-03-13 MED ORDER — AMBULATORY NON FORMULARY MEDICATION: 11 refills | Status: DC

## 2018-03-15 ENCOUNTER — Emergency Department (HOSPITAL_COMMUNITY): Payer: Medicare Other

## 2018-03-15 ENCOUNTER — Observation Stay (HOSPITAL_COMMUNITY)
Admission: EM | Admit: 2018-03-15 | Discharge: 2018-03-17 | Disposition: A | Discharge status: Home / Self Care | Payer: Medicare Other | Attending: Family Medicine | Admitting: Family Medicine

## 2018-03-15 ENCOUNTER — Encounter (HOSPITAL_COMMUNITY): Payer: Self-pay | Admitting: Emergency Medicine

## 2018-03-15 DIAGNOSIS — R079 Chest pain, unspecified: Secondary | ICD-10-CM | POA: Diagnosis not present

## 2018-03-15 DIAGNOSIS — J449 Chronic obstructive pulmonary disease, unspecified: Secondary | ICD-10-CM | POA: Diagnosis present

## 2018-03-15 DIAGNOSIS — R072 Precordial pain: Principal | ICD-10-CM | POA: Insufficient documentation

## 2018-03-15 DIAGNOSIS — N183 Chronic kidney disease, stage 3 unspecified: Secondary | ICD-10-CM | POA: Diagnosis present

## 2018-03-15 DIAGNOSIS — I499 Cardiac arrhythmia, unspecified: Secondary | ICD-10-CM | POA: Diagnosis not present

## 2018-03-15 DIAGNOSIS — Z79899 Other long term (current) drug therapy: Secondary | ICD-10-CM | POA: Diagnosis not present

## 2018-03-15 DIAGNOSIS — E785 Hyperlipidemia, unspecified: Secondary | ICD-10-CM | POA: Diagnosis present

## 2018-03-15 DIAGNOSIS — Z87891 Personal history of nicotine dependence: Secondary | ICD-10-CM | POA: Insufficient documentation

## 2018-03-15 DIAGNOSIS — I10 Essential (primary) hypertension: Secondary | ICD-10-CM | POA: Diagnosis present

## 2018-03-15 DIAGNOSIS — D72829 Elevated white blood cell count, unspecified: Secondary | ICD-10-CM | POA: Diagnosis present

## 2018-03-15 DIAGNOSIS — I213 ST elevation (STEMI) myocardial infarction of unspecified site: Secondary | ICD-10-CM | POA: Diagnosis not present

## 2018-03-15 DIAGNOSIS — R0789 Other chest pain: Secondary | ICD-10-CM | POA: Diagnosis not present

## 2018-03-15 LAB — I-STAT TROPONIN, ED: TROPONIN I, POC: 0 ng/mL (ref 0.00–0.08)

## 2018-03-15 LAB — CBC
HCT: 43.6 % (ref 36.0–46.0)
Hemoglobin: 13.7 g/dL (ref 12.0–15.0)
MCH: 31.4 pg (ref 26.0–34.0)
MCHC: 31.4 g/dL (ref 30.0–36.0)
MCV: 100 fL (ref 78.0–100.0)
PLATELETS: 246 10*3/uL (ref 150–400)
RBC: 4.36 MIL/uL (ref 3.87–5.11)
RDW: 13.7 % (ref 11.5–15.5)
WBC: 12.4 10*3/uL — AB (ref 4.0–10.5)

## 2018-03-15 LAB — BASIC METABOLIC PANEL
Anion gap: 9 (ref 5–15)
BUN: 32 mg/dL — AB (ref 8–23)
CO2: 27 mmol/L (ref 22–32)
CREATININE: 1.5 mg/dL — AB (ref 0.44–1.00)
Calcium: 8.9 mg/dL (ref 8.9–10.3)
Chloride: 105 mmol/L (ref 98–111)
GFR calc Af Amer: 39 mL/min — ABNORMAL LOW (ref >60)
GFR, EST NON AFRICAN AMERICAN: 34 mL/min — AB (ref >60)
Glucose, Bld: 124 mg/dL — ABNORMAL HIGH (ref 70–99)
Potassium: 4.8 mmol/L (ref 3.5–5.1)
SODIUM: 141 mmol/L (ref 135–145)

## 2018-03-15 NOTE — ED Provider Notes (Signed)
[name] EMERGENCY DEPARTMENT Provider Note   CSN: [id number] Arrival date & time: 03/15/18  2207     History   Chief Complaint Chief Complaint  Patient presents with  . Chest Pain    HPI Catherine Phillips is a 72 y.o. female.  HPI Patient is a 72 year old female with a history of COPD, hypertension, 45-year tobacco history who presents emergency department with anterior chest pain radiating to her jaw described as a pressure which began approximately 8 PM.  Her symptoms lasted approximately 2 hours and she had resolution by nitroglycerin by EMS.  She was given 1 nitroglycerin and aspirin by EMS.  On arrival to the emergency department she is currently pain-free.  No prior history of heart catheterization or stress test.  She feels much better at this time.  She states she felt slightly weak earlier today.  No nausea or vomiting.  No diaphoresis.  No shortness of breath.  No fevers or chills.  No productive cough.  No chest neck arm or jaw pain at this time.  Past Medical History:  Diagnosis Date  . Anxiety 06/24/2012  . COPD (chronic obstructive pulmonary disease) ([place])   . Diverticulosis   . Emphysema 06/24/2012  . Emphysema of lung ([place])   . Essential hypertension 06/24/2012  . History of Left leg claudication 06/24/2012  . Hypertension   . Peripheral vascular disease ([place])   . Renal disorder    Renal Insufficiency R=30%  . Shortness of breath dyspnea     Patient Active Problem List   Diagnosis Date Noted  . Nocturnal hypoxia 03/01/2018  . Acute respiratory failure with hypoxia ([place]) 12/23/2016  . COPD exacerbation ([place]) 12/23/2016  . Renal artery stenosis ([place]) 06/21/2016  . Medication reaction 05/31/2016  . Hyperkalemia 05/22/2016  . Epigastric abdominal pain 04/25/2016  . COPD (chronic obstructive pulmonary disease) ([place]) 05/02/2015  . Leukocytosis 05/02/2015  . SOB (shortness of breath) 05/02/2015  . Dehydration 05/02/2015  . AKI (acute kidney  injury) ([place]) 05/02/2015  . Osteoarthritis, hand 04/19/2015  . Gastritis 12/03/2014  . Pulmonary nodules 04/28/2014  . Peripheral neuropathy 10/28/2013  . COPD (chronic obstructive pulmonary disease) with emphysema [place] 08/22/2013  . Peripheral vascular disease ([place]) 01/22/2013  . Pain in limb 12/04/2012  . Atherosclerosis of native artery of extremity with intermittent claudication ([place]) 12/04/2012  . GERD (gastroesophageal reflux disease) 10/04/2012  . Osteoporosis 09/04/2012  . Hyperlipidemia LDL goal <70 06/26/2012  . History of Left leg claudication 06/24/2012  . Pulmonary emphysema ([place]) 06/24/2012  . Essential hypertension 06/24/2012  . Anxiety 06/24/2012    Past Surgical History:  Procedure Laterality Date  . ABDOMINAL AORTAGRAM  12/09/12  . ABDOMINAL AORTAGRAM N/A 12/09/2012   Procedure: ABDOMINAL Maxcine Ham;  Surgeon: Angelia Mould, MD;  Location: [facility] CATH LAB;  Service: Cardiovascular;  Laterality: N/A;     OB History   None      Home Medications    Prior to Admission medications   Medication Sig Start Date End Date Taking? Authorizing Provider  acetaminophen (TYLENOL) 500 MG tablet Take 1,000 mg by mouth every 6 (six) hours as needed (pain).    [provider]  albuterol (PROVENTIL HFA;VENTOLIN HFA) 108 (90 Base) MCG/ACT inhaler Inhale 2 puffs into the lungs every 4 (four) hours as needed for wheezing or shortness of breath. 05/16/16   Hommel, Sean, DO  albuterol (PROVENTIL) (2.5 MG/3ML) 0.083% nebulizer solution  11/09/16   [provider]  AMBULATORY NON FORMULARY MEDICATION Hypoxia  with Room Air at 83 percent. Needs 2L O2 as needed for O2 stats below 90. 03/01/18   Breeback, Jade L, PA-C  AMBULATORY NON FORMULARY MEDICATION Pt qualified for nocturnal O2 at 2L. 03/13/18   Breeback, Jade L, PA-C  amLODipine (NORVASC) 10 MG tablet Take 5 mg by mouth daily. 12/16/16   [provider]  azithromycin (ZITHROMAX) 250 MG tablet Take 2 tablets  now and then one tablet for 4 days. 02/26/18   Breeback, Jade L, PA-C  budesonide-formoterol (SYMBICORT) 160-4.5 MCG/ACT inhaler Inhale 2 puffs into the lungs 2 (two) times daily. Patient taking differently: Inhale 2 puffs into the lungs 2 (two) times daily as needed (shortness of breath/ wheezing).  04/12/16   Marcial Pacas, DO  diclofenac sodium (VOLTAREN) 1 % GEL Apply 4 g topically 4 (four) times daily. To affected joint. 07/13/17   Breeback, Jade L, PA-C  ipratropium-albuterol (DUONEB) 0.5-2.5 (3) MG/3ML SOLN Take 3 mLs by nebulization every 4 (four) hours as needed. 12/25/16   Robbie Lis, MD  labetalol (NORMODYNE) 100 MG tablet Take 100 mg by mouth daily.  11/02/16   [provider]  LIVALO 4 MG TABS TAKE 1 TABLET EVERY OTHER DAY 11/02/17   Breeback, Jade L, PA-C  LYRICA 100 MG capsule TAKE 1 CAPSULE BY MOUTH TWICE A DAY 02/27/18   Breeback, Jade L, PA-C  nystatin (MYCOSTATIN) 100000 UNIT/ML suspension TAKE 4 MLS (400,000 UNITS TOTAL) BY MOUTH 4 (FOUR) TIMES DAILY. 10/10/17   Breeback, Jade L, PA-C  predniSONE (DELTASONE) 20 MG tablet Take 3 tablets for 3 days, take 2 tablets for 3 days, take 1 tablet for 3 days, take 1/2 tablet for 4 days. 02/26/18   Breeback, Jade L, PA-C  Tiotropium Bromide Monohydrate (SPIRIVA RESPIMAT) 2.5 MCG/ACT AERS INHALE 2 PUFFS INTO THE LUNGS DAILY. 08/14/17   Donella Stade, PA-C    Family History Family History  Problem Relation Age of Onset  . Cancer Mother   . Heart attack Father   . Deep vein thrombosis Father   . Hyperlipidemia Father   . Hypertension Father     Social History Social History   Tobacco Use  . Smoking status: Former Smoker    Packs/day: 1.50    Years: 30.00    Pack years: 45.00    Types: Cigarettes    Last attempt to quit: 12/30/2009    Years since quitting: 8.2  . Smokeless tobacco: Never Used  Substance Use Topics  . Alcohol use: No  . Drug use: No     Allergies   Bactrim [sulfamethoxazole-trimethoprim]; Codeine;  and Levaquin [levofloxacin in d5w]   Review of Systems Review of Systems  All other systems reviewed and are negative.    Physical Exam Updated Vital Signs Ht 5\' 2"  (1.575 m)   Wt 53.1 kg (117 lb)   SpO2 97%   BMI 21.40 kg/m   Physical Exam  Constitutional: She is oriented to person, place, and time. She appears well-developed and well-nourished. No distress.  HENT:  Head: Normocephalic and atraumatic.  Eyes: EOM are normal.  Neck: Normal range of motion.  Cardiovascular: Normal rate, regular rhythm and normal heart sounds.  Pulmonary/Chest: Effort normal and breath sounds normal.  Abdominal: Soft. She exhibits no distension. There is no tenderness.  Musculoskeletal: Normal range of motion.  Neurological: She is alert and oriented to person, place, and time.  Skin: Skin is warm and dry.  Psychiatric: She has a normal mood and affect. Judgment normal.  Nursing  note and vitals reviewed.    ED Treatments / Results  Labs (all labs ordered are listed, but only abnormal results are displayed) Labs Reviewed  BASIC METABOLIC PANEL - Abnormal; Notable for the following components:      Result Value   Glucose, Bld 124 (*)    BUN 32 (*)    Creatinine, Ser 1.50 (*)    GFR calc non Af Amer 34 (*)    GFR calc Af Amer 39 (*)    All other components within normal limits  CBC - Abnormal; Notable for the following components:   WBC 12.4 (*)    All other components within normal limits  I-STAT TROPONIN, ED    EKG EKG Interpretation  Date/Time:  Friday March 15 2018 22:21:20 EDT Ventricular Rate:  76 PR Interval:    QRS Duration: 100 QT Interval:  390 QTC Calculation: 439 R Axis:   68 Text Interpretation:  Sinus rhythm inferior lateral st changes No significant change was found as compared to ECG December 23 2016 Confirmed by Jola Schmidt [phone]) on 03/15/2018 10:36:12 PM   Radiology Dg Chest Portable 1 View  Result Date: 03/15/2018 CLINICAL DATA:  Chest pain and pressure  EXAM: PORTABLE CHEST 1 VIEW COMPARISON:  03/29/2017 FINDINGS: Normal heart size. No aortic aneurysm. Emphysematous hyperinflation of the lungs, upper lobe predominant with chronic bronchitic change of the lungs. No acute osseous abnormality. IMPRESSION: COPD without active pulmonary disease. Electronically Signed   By: Ashley Royalty M.D.   On: 03/15/2018 23:15    Procedures Procedures (including critical care time)  Medications Ordered in ED Medications - No data to display   Initial Impression / Assessment and Plan / ED Course  I have reviewed the triage vital signs and the nursing notes.  Pertinent labs & imaging results that were available during my care of the patient were reviewed by me and considered in my medical decision making (see chart for details).     Patient asymptomatic on arrival.  No longer having chest pressure.  She does have nonspecific inferior lateral ST changes but these are consistent with her prior EKG.  There is no reciprocal depression noted.  We will need to follow the patient closely while in the emergency department.  She remains on cardiac monitor.  She will inform us if her pain returns.  Given her risk factors and her age and her typical symptoms she will be admitted to hospital for additional work-up.  Final Clinical Impressions(s) / ED Diagnoses   Final diagnoses:  None    ED Discharge Orders    None       Jola Schmidt, MD 03/16/18 0020

## 2018-03-15 NOTE — ED Triage Notes (Addendum)
Pt reports 10/10 pressure/pain  that started in her chest and radiated to her jaw. [facility] gave 1 nitro and 324mg  Asp which brought her pain to a 2/10.

## 2018-03-16 ENCOUNTER — Other Ambulatory Visit: Payer: Self-pay

## 2018-03-16 DIAGNOSIS — E785 Hyperlipidemia, unspecified: Secondary | ICD-10-CM | POA: Diagnosis not present

## 2018-03-16 DIAGNOSIS — N183 Chronic kidney disease, stage 3 unspecified: Secondary | ICD-10-CM | POA: Diagnosis present

## 2018-03-16 DIAGNOSIS — R0789 Other chest pain: Secondary | ICD-10-CM

## 2018-03-16 DIAGNOSIS — R079 Chest pain, unspecified: Secondary | ICD-10-CM | POA: Diagnosis not present

## 2018-03-16 DIAGNOSIS — R072 Precordial pain: Secondary | ICD-10-CM | POA: Diagnosis not present

## 2018-03-16 DIAGNOSIS — I10 Essential (primary) hypertension: Secondary | ICD-10-CM | POA: Diagnosis not present

## 2018-03-16 DIAGNOSIS — J410 Simple chronic bronchitis: Secondary | ICD-10-CM | POA: Diagnosis not present

## 2018-03-16 LAB — URINALYSIS, ROUTINE W REFLEX MICROSCOPIC
BILIRUBIN URINE: NEGATIVE
Glucose, UA: NEGATIVE mg/dL
HGB URINE DIPSTICK: NEGATIVE
Ketones, ur: NEGATIVE mg/dL
Nitrite: NEGATIVE
PH: 6 (ref 5.0–8.0)
Protein, ur: NEGATIVE mg/dL
SPECIFIC GRAVITY, URINE: 1.009 (ref 1.005–1.030)

## 2018-03-16 LAB — TROPONIN I: Troponin I: <0.03 ng/mL (ref <0.03)

## 2018-03-16 LAB — HEMOGLOBIN A1C
Hgb A1c MFr Bld: 6.4 % — ABNORMAL HIGH (ref 4.8–5.6)
MEAN PLASMA GLUCOSE: 136.98 mg/dL

## 2018-03-16 LAB — LIPID PANEL
CHOLESTEROL: 198 mg/dL (ref 0–200)
HDL: 37 mg/dL — ABNORMAL LOW (ref >40)
LDL Cholesterol: 101 mg/dL — ABNORMAL HIGH (ref 0–99)
TRIGLYCERIDES: 302 mg/dL — AB (ref <150)
Total CHOL/HDL Ratio: 5.4 RATIO
VLDL: 60 mg/dL — AB (ref 0–40)

## 2018-03-16 MED ORDER — IPRATROPIUM-ALBUTEROL 0.5-2.5 (3) MG/3ML IN SOLN
3.0000 mL | Freq: Four times a day (QID) | RESPIRATORY_TRACT | Status: DC
  Administered 2018-03-16 (×2): 3 mL via RESPIRATORY_TRACT
  Filled 2018-03-16 (×2): qty 3

## 2018-03-16 MED ORDER — PRAVASTATIN SODIUM 80 MG PO TABS
80.0000 mg | ORAL_TABLET | Freq: Every day | ORAL | Status: DC
  Administered 2018-03-16: 80 mg via ORAL
  Filled 2018-03-16 (×3): qty 1

## 2018-03-16 MED ORDER — ALPRAZOLAM 0.25 MG PO TABS: 0.2500 mg | ORAL_TABLET | Freq: Two times a day (BID) | ORAL | Status: DC | PRN

## 2018-03-16 MED ORDER — ALBUTEROL SULFATE (2.5 MG/3ML) 0.083% IN NEBU
2.5000 mg | INHALATION_SOLUTION | RESPIRATORY_TRACT | Status: DC | PRN
Start: 2018-03-16 — End: 2018-03-17

## 2018-03-16 MED ORDER — LABETALOL HCL 100 MG PO TABS
100.0000 mg | ORAL_TABLET | Freq: Every day | ORAL | Status: DC
  Administered 2018-03-16 – 2018-03-17 (×2): 100 mg via ORAL
  Filled 2018-03-16 (×2): qty 1

## 2018-03-16 MED ORDER — ORAL CARE MOUTH RINSE
15.0000 mL | Freq: Two times a day (BID) | OROMUCOSAL | Status: DC
  Administered 2018-03-16 – 2018-03-17 (×2): 15 mL via OROMUCOSAL

## 2018-03-16 MED ORDER — TIOTROPIUM BROMIDE MONOHYDRATE 18 MCG IN CAPS
18.0000 ug | ORAL_CAPSULE | Freq: Every day | RESPIRATORY_TRACT | Status: DC
  Administered 2018-03-16: 18 ug via RESPIRATORY_TRACT
  Filled 2018-03-16: qty 5

## 2018-03-16 MED ORDER — AMLODIPINE BESYLATE 5 MG PO TABS
5.0000 mg | ORAL_TABLET | Freq: Every day | ORAL | Status: DC
  Administered 2018-03-16 – 2018-03-17 (×2): 5 mg via ORAL
  Filled 2018-03-16 (×2): qty 1

## 2018-03-16 MED ORDER — PREGABALIN 50 MG PO CAPS
100.0000 mg | ORAL_CAPSULE | Freq: Two times a day (BID) | ORAL | Status: DC
  Administered 2018-03-16 – 2018-03-17 (×4): 100 mg via ORAL
  Filled 2018-03-16: qty 1
  Filled 2018-03-16: qty 4
  Filled 2018-03-16 (×2): qty 2

## 2018-03-16 MED ORDER — PANTOPRAZOLE SODIUM 20 MG PO TBEC
20.0000 mg | DELAYED_RELEASE_TABLET | Freq: Every day | ORAL | Status: DC
  Administered 2018-03-16 – 2018-03-17 (×2): 20 mg via ORAL
  Filled 2018-03-16 (×3): qty 1

## 2018-03-16 MED ORDER — NITROGLYCERIN 0.4 MG SL SUBL: 0.4000 mg | SUBLINGUAL_TABLET | SUBLINGUAL | Status: DC | PRN

## 2018-03-16 MED ORDER — ASPIRIN EC 325 MG PO TBEC
325.0000 mg | DELAYED_RELEASE_TABLET | Freq: Every day | ORAL | Status: DC
  Administered 2018-03-16 – 2018-03-17 (×2): 325 mg via ORAL
  Filled 2018-03-16 (×2): qty 1

## 2018-03-16 MED ORDER — MOMETASONE FURO-FORMOTEROL FUM 200-5 MCG/ACT IN AERO
2.0000 | INHALATION_SPRAY | Freq: Two times a day (BID) | RESPIRATORY_TRACT | Status: DC
  Administered 2018-03-16: 2 via RESPIRATORY_TRACT
  Filled 2018-03-16: qty 8.8

## 2018-03-16 MED ORDER — DICLOFENAC SODIUM 1 % TD GEL
4.0000 g | Freq: Four times a day (QID) | TRANSDERMAL | Status: DC
  Filled 2018-03-16: qty 100

## 2018-03-16 MED ORDER — DM-GUAIFENESIN ER 30-600 MG PO TB12: 1.0000 | ORAL_TABLET | Freq: Two times a day (BID) | ORAL | Status: DC | PRN

## 2018-03-16 MED ORDER — MORPHINE SULFATE (PF) 4 MG/ML IV SOLN: 1.0000 mg | INTRAVENOUS | Status: DC | PRN

## 2018-03-16 MED ORDER — ACETAMINOPHEN 325 MG PO TABS: 650.0000 mg | ORAL_TABLET | Freq: Four times a day (QID) | ORAL | Status: DC | PRN

## 2018-03-16 MED ORDER — IPRATROPIUM-ALBUTEROL 0.5-2.5 (3) MG/3ML IN SOLN: 3.0000 mL | Freq: Four times a day (QID) | RESPIRATORY_TRACT | Status: DC | PRN

## 2018-03-16 NOTE — ED Notes (Signed)
Pt reports having chest pain that was relieved by nitro and asa that was administered by EMS. Pt reports being pain free at this time.

## 2018-03-16 NOTE — Consult Note (Addendum)
Cardiology Consult    Patient ID: Catherine Phillips; [id number]; 1945-12-29   Admit date: 03/15/2018 Date of Consult: 03/16/2018  Primary Care Provider: Donella Stade, PA-C Primary Cardiologist: New to [facility]   Patient Profile    Catherine Phillips is a 72 y.o. female with past medical history of PAD (chronic left external iliac occlusion by angiography in 2014), HTN, Stage 3 CKD, COPD, and prior tobacco use who is being seen today for the evaluation of chest pain at the request of Dr. Blaine Hamper.   History of Present Illness    Catherine Phillips presented to [facility] ED on 03/15/2018 for evaluation of chest pain. Reported being in her usually state of health until the evening of her presentation until she developed substernal chest pressure. She had consumed meatloaf, green beans, and mashed potatoes for supper but starting approximately 1 hour later, she developed an overall tightness across her chest. She had been lying down but symptoms did not improved with sitting up or positional changes. Was not worse with exertion. Symptoms lasted for over 45 minutes, therefore she called EMS.  She was given sublingual nitroglycerin en route to the hospital and reports her symptoms improved but did not fully resolve. Symptoms did resolve while she was in the ED and she denies any recurrent symptoms since. Has been ambulating around her room without anginal symptoms.  She does not exercise regularly due to her COPD but still works in the [employer] at [employer] for 40 hours per week and denies any symptoms when walking around the store or performing job duties. Has COPD and uses oxygen supplementation PRN. Has known HTN and HLD which she reports are well-controlled. No known history of CAD. Reports her father did die from an MI in his 21's. She is a former smoker but quit 7 years ago.   Initial labs show WBC 12.4, Hgb 13.7, platelets 246, Na+ 141, K+ 4.8, and creatinine 1.50 (baseline 1.3 - 1.4). Initial troponin has been  negative with repeat values pending. Hgb A1c at 6.4.Lipid Panel shows total cholesterol of 198, HDL 37, Triglycerides 302, and LDL 101.EKG shows NSR, HR 76, and peaked T-waves (overall similar to prior tracings from 2018). CXR shows COPD without active pulmonary disease.    Past Medical History:  Diagnosis Date  . Anxiety 06/24/2012  . COPD (chronic obstructive pulmonary disease) ([place])   . Diverticulosis   . Emphysema 06/24/2012  . Emphysema of lung ([place])   . Essential hypertension 06/24/2012  . History of Left leg claudication 06/24/2012  . Hypertension   . Peripheral vascular disease ([place])   . Renal disorder    Renal Insufficiency R=30%  . Shortness of breath dyspnea     Past Surgical History:  Procedure Laterality Date  . ABDOMINAL AORTAGRAM  12/09/12  . ABDOMINAL AORTAGRAM N/A 12/09/2012   Procedure: ABDOMINAL Maxcine Ham;  Surgeon: Angelia Mould, MD;  Location: [facility] CATH LAB;  Service: Cardiovascular;  Laterality: N/A;     Home Medications:  Prior to Admission medications   Medication Sig Start Date End Date Taking? Authorizing Provider  acetaminophen (TYLENOL) 500 MG tablet Take 1,000 mg by mouth every 6 (six) hours as needed (pain).    [provider]  albuterol (PROVENTIL HFA;VENTOLIN HFA) 108 (90 Base) MCG/ACT inhaler Inhale 2 puffs into the lungs every 4 (four) hours as needed for wheezing or shortness of breath. 05/16/16   Hommel, Sean, DO  albuterol (PROVENTIL) (2.5 MG/3ML) 0.083% nebulizer solution  11/09/16   [provider]  AMBULATORY NON FORMULARY MEDICATION Hypoxia with Room Air at 83 percent. Needs 2L O2 as needed for O2 stats below 90. 03/01/18   Breeback, Jade L, PA-C  AMBULATORY NON FORMULARY MEDICATION Pt qualified for nocturnal O2 at 2L. 03/13/18   Breeback, Jade L, PA-C  amLODipine (NORVASC) 10 MG tablet Take 5 mg by mouth daily. 12/16/16   [provider]  azithromycin (ZITHROMAX) 250 MG tablet Take 2 tablets now and then one tablet for  4 days. 02/26/18   Breeback, Jade L, PA-C  budesonide-formoterol (SYMBICORT) 160-4.5 MCG/ACT inhaler Inhale 2 puffs into the lungs 2 (two) times daily. Patient taking differently: Inhale 2 puffs into the lungs 2 (two) times daily as needed (shortness of breath/ wheezing).  04/12/16   Marcial Pacas, DO  diclofenac sodium (VOLTAREN) 1 % GEL Apply 4 g topically 4 (four) times daily. To affected joint. 07/13/17   Breeback, Jade L, PA-C  ipratropium-albuterol (DUONEB) 0.5-2.5 (3) MG/3ML SOLN Take 3 mLs by nebulization every 4 (four) hours as needed. 12/25/16   Robbie Lis, MD  labetalol (NORMODYNE) 100 MG tablet Take 100 mg by mouth daily.  11/02/16   [provider]  LIVALO 4 MG TABS TAKE 1 TABLET EVERY OTHER DAY 11/02/17   Breeback, Jade L, PA-C  LYRICA 100 MG capsule TAKE 1 CAPSULE BY MOUTH TWICE A DAY 02/27/18   Breeback, Jade L, PA-C  nystatin (MYCOSTATIN) 100000 UNIT/ML suspension TAKE 4 MLS (400,000 UNITS TOTAL) BY MOUTH 4 (FOUR) TIMES DAILY. 10/10/17   Breeback, Jade L, PA-C  predniSONE (DELTASONE) 20 MG tablet Take 3 tablets for 3 days, take 2 tablets for 3 days, take 1 tablet for 3 days, take 1/2 tablet for 4 days. 02/26/18   Breeback, Jade L, PA-C  Tiotropium Bromide Monohydrate (SPIRIVA RESPIMAT) 2.5 MCG/ACT AERS INHALE 2 PUFFS INTO THE LUNGS DAILY. 08/14/17   Donella Stade, PA-C    Inpatient Medications: Scheduled Meds: . amLODipine  5 mg Oral Daily  . aspirin EC  325 mg Oral Daily  . diclofenac sodium  4 g Topical QID  . ipratropium-albuterol  3 mL Nebulization Q6H  . labetalol  100 mg Oral Daily  . mometasone-formoterol  2 puff Inhalation BID  . pravastatin  80 mg Oral q1800  . pregabalin  100 mg Oral BID  . tiotropium  18 mcg Inhalation Daily   Continuous Infusions:  PRN Meds: acetaminophen, albuterol, ALPRAZolam, dextromethorphan-guaiFENesin, morphine injection, nitroGLYCERIN  Allergies:    Allergies  Allergen Reactions  . Codeine Nausea And Vomiting  . Levaquin  [Levofloxacin In D5w] Itching and Rash  . Sulfamethoxazole-Trimethoprim Other (See Comments)    Hyperkalemia (a high level of the electrolyte potassium in the blood)    Social History:   Social History   Socioeconomic History  . Marital status: Married    Spouse name: Not on file  . Number of children: Not on file  . Years of education: Not on file  . Highest education level: Not on file  Occupational History  . Not on file  Social Needs  . Financial resource strain: Not on file  . Food insecurity:    Worry: Not on file    Inability: Not on file  . Transportation needs:    Medical: Not on file    Non-medical: Not on file  Tobacco Use  . Smoking status: Former Smoker    Packs/day: 1.50    Years: 30.00    Pack years: 45.00    Types:  Cigarettes    Last attempt to quit: 12/30/2009    Years since quitting: 8.2  . Smokeless tobacco: Never Used  Substance and Sexual Activity  . Alcohol use: No  . Drug use: No  . Sexual activity: Not on file  Lifestyle  . Physical activity:    Days per week: Not on file    Minutes per session: Not on file  . Stress: Not on file  Relationships  . Social connections:    Talks on phone: Not on file    Gets together: Not on file    Attends religious service: Not on file    Active member of club or organization: Not on file    Attends meetings of clubs or organizations: Not on file    Relationship status: Not on file  . Intimate partner violence:    Fear of current or ex partner: Not on file    Emotionally abused: Not on file    Physically abused: Not on file    Forced sexual activity: Not on file  Other Topics Concern  . Not on file  Social History Narrative  . Not on file     Family History:    Family History  Problem Relation Age of Onset  . Cancer Mother   . Heart attack Father   . Deep vein thrombosis Father   . Hyperlipidemia Father   . Hypertension Father       Review of Systems    General:  No chills, fever, night  sweats or weight changes.  Cardiovascular:  No  dyspnea on exertion, edema, orthopnea, palpitations, paroxysmal nocturnal dyspnea. Positive for chest pain.  Dermatological: No rash, lesions/masses Respiratory: No cough, dyspnea Urologic: No hematuria, dysuria Abdominal:   No nausea, vomiting, diarrhea, bright red blood per rectum, melena, or hematemesis Neurologic:  No visual changes, wkns, changes in mental status. All other systems reviewed and are otherwise negative except as noted above.  Physical Exam/Data    Vitals:   03/16/18 0747 03/16/18 0800 03/16/18 0843 03/16/18 0854  BP: (!) 131/51     Pulse: 62     Resp: 16 16    Temp: 97.7 F (36.5 C)     TempSrc: Oral     SpO2: 95%  95% 99%  Weight:      Height:       No intake or output data in the 24 hours ending 03/16/18 0927 Filed Weights   03/15/18 2212  Weight: 117 lb (53.1 kg)   Body mass index is 21.4 kg/m.   General: Pleasant, Caucasian female appearing in NAD Psych: Normal affect. Neuro: Alert and oriented X 3. Moves all extremities spontaneously. HEENT: Normal  Neck: Supple without bruits or JVD. Lungs:  Resp regular and unlabored, CTA without wheezing or rales. Heart: RRR no s3, s4, or murmurs. Abdomen: Soft, non-tender, non-distended, BS + x 4.  Extremities: No clubbing, cyanosis or edema. DP/PT/Radials 2+ and equal bilaterally.   EKG:  The EKG was personally reviewed and demonstrates: NSR, HR 76, and peaked T-waves (overall similar to prior tracings from 2018).    Labs/Studies     Relevant CV Studies:  Echocardiogram: Pending  Laboratory Data:  Chemistry Recent Labs  Lab 03/15/18 2214  NA 141  K 4.8  CL 105  CO2 27  GLUCOSE 124*  BUN 32*  CREATININE 1.50*  CALCIUM 8.9  GFRNONAA 34*  GFRAA 39*  ANIONGAP 9    No results for input(s): PROT, ALBUMIN, AST, ALT, ALKPHOS, BILITOT  in the last 168 hours. Hematology Recent Labs  Lab 03/15/18 2214  WBC 12.4*  RBC 4.36  HGB 13.7  HCT 43.6   MCV 100.0  MCH 31.4  MCHC 31.4  RDW 13.7  PLT 246   Cardiac Enzymes Recent Labs  Lab 03/16/18 0221  TROPONINI <0.03    Recent Labs  Lab 03/15/18 2258  TROPIPOC 0.00    BNPNo results for input(s): BNP, PROBNP in the last 168 hours.  DDimer No results for input(s): DDIMER in the last 168 hours.  Radiology/Studies:  Dg Chest Portable 1 View  Result Date: 03/15/2018 CLINICAL DATA:  Chest pain and pressure EXAM: PORTABLE CHEST 1 VIEW COMPARISON:  03/29/2017 FINDINGS: Normal heart size. No aortic aneurysm. Emphysematous hyperinflation of the lungs, upper lobe predominant with chronic bronchitic change of the lungs. No acute osseous abnormality. IMPRESSION: COPD without active pulmonary disease. Electronically Signed   By: Ashley Royalty M.D.   On: 03/15/2018 23:15     Assessment & Plan    1. Atypical Chest Pain - presented for evaluation of chest discomfort which started one hour after food consumption while she was lying down. No association with exertion or positional changes. Symptoms did improve with the use of SL NTG but did not resolve and lasted for over 2 hours. She denies any exertional chest discomfort over the past few months. - initial and delta troponin values have been negative with repeat values pending. EKG shows NSR with peaked T-waves and no acute ST changes when compared to prior tracings. An echocardiogram is pending to assess for structural abnormalities.  - her presenting symptoms overall seem atypical for a cardiac etiology and most consistent with GERD. Would recommend initiation of PPI therapy. She does have multiple cardiac risk factors including PAD, HTN, HLD, prior tobacco use, and family history of CAD, therefore stress testing could be considered as an outpatient if enzymes remain negative and echo is reassuring.   2. HTN - BP has overall been well-controlled at 115/45 - 146/63 since admission. - continue current medication regimen.   3. HLD - followed by  PCP. Lipid Panel shows total cholesterol of 198, HDL 37, Triglycerides 302, and LDL 101. Not at goal of LDL < 70 in the setting of known PAD. - remains on Livalo 4mg  every other day as she has been intolerant to high-intensity statin therapy in the past.   4. PAD - she has a chronic left external iliac occlusion by angiography in 2014. Followed by Dr. Scot Dock in the outpatient setting.   5. COPD - uses supplemental O2 at home. No active wheezing appreciated on examination this morning.    For questions or updates, please contact [place] Please consult [email or web address] for contact info under Cardiology/STEMI.  Signed, Erma Heritage, PA-C 03/16/2018, 9:27 AM Pager: [phone]  As above, patient seen and examined.  Briefly she is a 72 year old female with past medical history of peripheral vascular disease, hypertension, hyperlipidemia, COPD, chronic stage III kidney disease for evaluation of chest pain.  Patient uses home oxygen occasionally.  She does have some dyspnea on exertion which is chronic and unchanged.  She does not have exertional chest pain.  Last evening approximately 1 hour after eating dinner she laid down and developed tightness in her chest and back.  The pain was not pleuritic or positional and there was no water brash.  Symptoms persisted and therefore EMS was called.  Her symptoms improved with aspirin and nitroglycerin.  Total duration of symptoms was greater  than 1 hour.  She has had no further symptoms since.  Chest pain-symptoms with both typical and atypical features.  Possible GI etiology given the occurred 1 hour after eating.  She also had chest pain for over 1 hour and enzymes are negative; electrocardiogram shows sinus rhythm with subtle inferior lateral ST elevation.  It is unchanged compared to December 23, 2016.  We will await echocardiogram.  If normal LV function patient can be discharged; given multiple risk factors would arrange a [place] nuclear study  for risk stratification.  Would treat with aspirin, statin long-term.  Continue preadmission medications for blood pressure. Kirk Ruths, MD

## 2018-03-16 NOTE — ED Notes (Signed)
Hospitalist in room with patient at this time.  

## 2018-03-16 NOTE — ED Notes (Signed)
Gave pt urine cup/cloth for specimen and gave her a few moments to drink coffee then attempt using restroom. Upon returning to her room she states she was unable to use it with first attempt but will re-try momentarily

## 2018-03-16 NOTE — H&P (Signed)
History and Physical    Tiahna Cure NWG:[id number] DOB: 1945-09-24 DOA: 03/15/2018  Referring MD/NP/PA:   PCP: Donella Stade, PA-C   Patient coming from:  The patient is coming from home.  At baseline, pt is independent for most of ADL.  Chief Complaint: chest pain  HPI: Catherine Phillips is a 72 y.o. female with medical history significant of hypertension, COPD on 2 L nasal cannula oxygen at home, PAD, CKD 3, anxiety, who presents with chest pain.  Patient states that her chest pain started at about 7:30 PM.  It was located in substernal area, initially 10 out of 10 severity, currently 2 out of 10 severity, pressure-like pain, radiating right jaw and neck.  Patient states that she has chronic mild shortness of breath due to COPD, which has not changed.  No cough, fever or chills.  No tenderness in the calf areas.  Her chest pain has resolved after treated with ASA and NTG.  Patient does not have nausea vomiting, diarrhea, abdominal pain, symptoms of UTI or unilateral weakness.  ED Course: pt was found to have negative troponin, WBC 12.4, renal function close to baseline, temperature normal, no tachycardia, no tachypnea, oxygen satting 97% on 2 L nasal cannula oxygen, negative chest x-ray.  Patient is placed on telemetry bed of observation.  Review of Systems:   General: no fevers, chills, no body weight gain, has fatigue HEENT: no blurry vision, hearing changes or sore throat Respiratory: has dyspnea, no coughing, wheezing CV: has chest pain, no palpitations GI: no nausea, vomiting, abdominal pain, diarrhea, constipation GU: no dysuria, burning on urination, increased urinary frequency, hematuria  Ext: no leg edema Neuro: no unilateral weakness, numbness, or tingling, no vision change or hearing loss Skin: no rash, no skin tear. MSK: No muscle spasm, no deformity, no limitation of range of movement in spin Heme: No easy bruising.  Travel history: No recent long distant  travel.  Allergy:  Allergies  Allergen Reactions  . Codeine Nausea And Vomiting  . Levaquin [Levofloxacin In D5w] Itching and Rash  . Sulfamethoxazole-Trimethoprim Other (See Comments)    Hyperkalemia (a high level of the electrolyte potassium in the blood)    Past Medical History:  Diagnosis Date  . Anxiety 06/24/2012  . COPD (chronic obstructive pulmonary disease) ([place])   . Diverticulosis   . Emphysema 06/24/2012  . Emphysema of lung ([place])   . Essential hypertension 06/24/2012  . History of Left leg claudication 06/24/2012  . Hypertension   . Peripheral vascular disease ([place])   . Renal disorder    Renal Insufficiency R=30%  . Shortness of breath dyspnea     Past Surgical History:  Procedure Laterality Date  . ABDOMINAL AORTAGRAM  12/09/12  . ABDOMINAL AORTAGRAM N/A 12/09/2012   Procedure: ABDOMINAL Maxcine Ham;  Surgeon: Angelia Mould, MD;  Location: [facility] CATH LAB;  Service: Cardiovascular;  Laterality: N/A;    Social History:  reports that she quit smoking about 8 years ago. Her smoking use included cigarettes. She has a 45.00 pack-year smoking history. She has never used smokeless tobacco. She reports that she does not drink alcohol or use drugs.  Family History:  Family History  Problem Relation Age of Onset  . Cancer Mother   . Heart attack Father   . Deep vein thrombosis Father   . Hyperlipidemia Father   . Hypertension Father      Prior to Admission medications   Medication Sig Start Date End Date Taking? Authorizing Provider  acetaminophen (TYLENOL)  500 MG tablet Take 1,000 mg by mouth every 6 (six) hours as needed (pain).    [provider]  albuterol (PROVENTIL HFA;VENTOLIN HFA) 108 (90 Base) MCG/ACT inhaler Inhale 2 puffs into the lungs every 4 (four) hours as needed for wheezing or shortness of breath. 05/16/16   Hommel, Sean, DO  albuterol (PROVENTIL) (2.5 MG/3ML) 0.083% nebulizer solution  11/09/16   [provider]  AMBULATORY NON  [place] Hypoxia with Room Air at 83 percent. Needs 2L O2 as needed for O2 stats below 90. 03/01/18   Breeback, Jade L, PA-C  AMBULATORY NON FORMULARY MEDICATION Pt qualified for nocturnal O2 at 2L. 03/13/18   Breeback, Jade L, PA-C  amLODipine (NORVASC) 10 MG tablet Take 5 mg by mouth daily. 12/16/16   [provider]  azithromycin (ZITHROMAX) 250 MG tablet Take 2 tablets now and then one tablet for 4 days. 02/26/18   Breeback, Jade L, PA-C  budesonide-formoterol (SYMBICORT) 160-4.5 MCG/ACT inhaler Inhale 2 puffs into the lungs 2 (two) times daily. Patient taking differently: Inhale 2 puffs into the lungs 2 (two) times daily as needed (shortness of breath/ wheezing).  04/12/16   Marcial Pacas, DO  diclofenac sodium (VOLTAREN) 1 % GEL Apply 4 g topically 4 (four) times daily. To affected joint. 07/13/17   Breeback, Jade L, PA-C  ipratropium-albuterol (DUONEB) 0.5-2.5 (3) MG/3ML SOLN Take 3 mLs by nebulization every 4 (four) hours as needed. 12/25/16   Robbie Lis, MD  labetalol (NORMODYNE) 100 MG tablet Take 100 mg by mouth daily.  11/02/16   [provider]  LIVALO 4 MG TABS TAKE 1 TABLET EVERY OTHER DAY 11/02/17   Breeback, Jade L, PA-C  LYRICA 100 MG capsule TAKE 1 CAPSULE BY MOUTH TWICE A DAY 02/27/18   Breeback, Jade L, PA-C  nystatin (MYCOSTATIN) 100000 UNIT/ML suspension TAKE 4 MLS (400,000 UNITS TOTAL) BY MOUTH 4 (FOUR) TIMES DAILY. 10/10/17   Breeback, Jade L, PA-C  predniSONE (DELTASONE) 20 MG tablet Take 3 tablets for 3 days, take 2 tablets for 3 days, take 1 tablet for 3 days, take 1/2 tablet for 4 days. 02/26/18   Breeback, Jade L, PA-C  Tiotropium Bromide Monohydrate (SPIRIVA RESPIMAT) 2.5 MCG/ACT AERS INHALE 2 PUFFS INTO THE LUNGS DAILY. 08/14/17   Donella Stade, PA-C    Physical Exam: Vitals:   03/15/18 2345 03/16/18 0015 03/16/18 0030 03/16/18 0045  BP: (!) 119/52 (!) 122/48 (!) 116/48 (!) 116/45  Pulse: 61 60 (!) 58 64  Resp: 13 16 14 17   SpO2: 97% 97%  97% 98%  Weight:      Height:       General: Not in acute distress HEENT:       Eyes: PERRL, EOMI, no scleral icterus.       ENT: No discharge from the ears and nose, no pharynx injection, no tonsillar enlargement.        Neck: No JVD, no bruit, no mass felt. Heme: No neck lymph node enlargement. Cardiac: S1/S2, RRR, No murmurs, No gallops or rubs. Respiratory:  No rales, wheezing, rhonchi or rubs. GI: Soft, nondistended, nontender, no rebound pain, no organomegaly, BS present. GU: No hematuria Ext: No pitting leg edema bilaterally. 2+DP/PT pulse bilaterally. Musculoskeletal: No joint deformities, No joint redness or warmth, no limitation of ROM in spin. Skin: No rashes.  Neuro: Alert, oriented X3, cranial nerves II-XII grossly intact, moves all extremities normally. Psych: Patient is not psychotic, no suicidal or hemocidal ideation.  Labs on Admission:  I have personally reviewed following labs and imaging studies  CBC: Recent Labs  Lab 03/15/18 2214  WBC 12.4*  HGB 13.7  HCT 43.6  MCV 100.0  PLT 810   Basic Metabolic Panel: Recent Labs  Lab 03/15/18 2214  NA 141  K 4.8  CL 105  CO2 27  GLUCOSE 124*  BUN 32*  CREATININE 1.50*  CALCIUM 8.9   GFR: Estimated Creatinine Clearance: 27.2 mL/min (A) (by C-G formula based on SCr of 1.5 mg/dL (H)). Liver Function Tests: No results for input(s): AST, ALT, ALKPHOS, BILITOT, PROT, ALBUMIN in the last 168 hours. No results for input(s): LIPASE, AMYLASE in the last 168 hours. No results for input(s): AMMONIA in the last 168 hours. Coagulation Profile: No results for input(s): INR, PROTIME in the last 168 hours. Cardiac Enzymes: No results for input(s): CKTOTAL, CKMB, CKMBINDEX, TROPONINI in the last 168 hours. BNP (last 3 results) No results for input(s): PROBNP in the last 8760 hours. HbA1C: No results for input(s): HGBA1C in the last 72 hours. CBG: No results for input(s): GLUCAP in the last 168 hours. Lipid  Profile: No results for input(s): CHOL, HDL, LDLCALC, TRIG, CHOLHDL, LDLDIRECT in the last 72 hours. Thyroid Function Tests: No results for input(s): TSH, T4TOTAL, FREET4, T3FREE, THYROIDAB in the last 72 hours. Anemia Panel: No results for input(s): VITAMINB12, FOLATE, FERRITIN, TIBC, IRON, RETICCTPCT in the last 72 hours. Urine analysis:    Component Value Date/Time   COLORURINE YELLOW 06/13/2016 [place] 06/13/2016 1033   LABSPEC 1.013 06/13/2016 1033   PHURINE 6.0 06/13/2016 1033   GLUCOSEU NEGATIVE 06/13/2016 1033   HGBUR NEGATIVE 06/13/2016 1033   BILIRUBINUR NEGATIVE 06/13/2016 1033   BILIRUBINUR negative 05/16/2016 1434   KETONESUR NEGATIVE 06/13/2016 1033   PROTEINUR NEGATIVE 06/13/2016 1033   UROBILINOGEN 0.2 05/16/2016 1434   UROBILINOGEN 0.2 06/26/2015 1700   NITRITE NEGATIVE 06/13/2016 1033   LEUKOCYTESUR NEGATIVE 06/13/2016 1033   Sepsis Labs: @LABRCNTIP (procalcitonin:4,lacticidven:4) )No results found for this or any previous visit (from the past 240 hour(s)).   Radiological Exams on Admission: Dg Chest Portable 1 View  Result Date: 03/15/2018 CLINICAL DATA:  Chest pain and pressure EXAM: PORTABLE CHEST 1 VIEW COMPARISON:  03/29/2017 FINDINGS: Normal heart size. No aortic aneurysm. Emphysematous hyperinflation of the lungs, upper lobe predominant with chronic bronchitic change of the lungs. No acute osseous abnormality. IMPRESSION: COPD without active pulmonary disease. Electronically Signed   By: Ashley Royalty M.D.   On: 03/15/2018 23:15     EKG: Independently reviewed.  Sinus rhythm, QTC 439, T wave inversion in lead V1-V2, high T wave in inferior leads and V3-V6, which is similar to previous EKG.  Assessment/Plan Principal Problem:   Chest pain Active Problems:   Essential hypertension   Hyperlipidemia LDL goal <70   COPD (chronic obstructive pulmonary disease) (HCC)   Leukocytosis   CKD (chronic kidney disease), stage III (HCC)   Chest  pain: Her chest pain is resolved after treated with aspirin and nitroglycerin.  EKG has T wave inversion in V1-V2, high T wave in inferior leads and V3-V6, which is old pattern.  Initial troponin negative.  Patient has leukocytosis, but no fever, chest x-ray negative.  Less likely to have pneumonia.  No signs of DVT, no shortness of breath or oxygen desaturation, less likely to have PE.  - will place on Tele bed for obs - cycle CE q6 x3 and repeat EKG in the am  - prn Nitroglycerin, Morphine, and aspirin,  pravastatin - Risk factor stratification: will check FLP and A1C  - 2d echo - inpt card consult was requested via Epic  HTN:  -Continue home medications: Amlodipine, labetalol -IV hydralazine prn  Hyperlipidemia LDL goal <70: -Pravastatin  COPD (chronic obstructive pulmonary disease) ([place]): stable -DuoNeb nebulizer, Dulera inhaler, Spiriva inhaler, as needed albuterol nebulizer  Leukocytosis: WBC 12.4, chest x-ray negative.  No fever.   -Follow-up urinalysis  CKD (chronic kidney disease), stage III ([place]):  Baseline creatinine 1.3-1.4.  Her creatinine is 1.50, BUN 32, close to baseline. -Follow-up renal function by BMP  DVT ppx: SQ Lovenox Code Status: Full code Family Communication: None at bed side.  Disposition Plan:  Anticipate discharge back to previous home environment Consults called:  none Admission status: Obs / tele   Date of Service 03/16/2018    Ivor Costa [facility] Pager [phone]  If 7PM-7AM, please contact night-coverage [email or web address] Password [facility] 03/16/2018, 1:41 AM

## 2018-03-16 NOTE — Progress Notes (Signed)
PROGRESS NOTE  Subjective: Patient reports resolution of symptoms, specifically states she had tight central chest pain with abrupt onset radiating to the back/neck when laying down shortly after large dinner last night. Did not have dyspnea. Pain resolved with NTG and has not returned.   Objective: BP (!) 92/56 (BP Location: Right Arm)   Pulse 68   Temp 98.2 F (36.8 C) (Oral)   Resp 20   Ht 5\' 2"  (1.575 m)   Wt 54.3 kg (119 lb 11.2 oz)   SpO2 95%   BMI 21.89 kg/m   Gen: Well-appearing Pulm: Clear and nonlabored on supplemental oxygen   CV: RRR, no murmur, no JVD, no edema GI: Soft, NT, ND, +BS  Neuro: Alert and oriented. No focal deficits. Skin: No rashes, lesions or ulcers  Assessment & Plan: Chest pain, atypical: I favor esophageal spasm with history of GERD and symptom history in addition to negative cardiac enzymes and unchanged subtle inf/lateral ST elevated.  - Per cardiology, will need lexiscan stress test for risk stratification but likely can accomplish this as outpatient. They request echocardiogram which, if with normal LV function, no wall motion abnormalities, would permit discharge. I changed the order early this morning to "anticipated discharge" and the RN in the ED has called the echo tech. Unfortunately, it appears the echocardiogram has not been performed yet and the patient will remain overnight.   Patrecia Pour, MD 03/16/2018, 4:46 PM

## 2018-03-17 ENCOUNTER — Observation Stay (HOSPITAL_BASED_OUTPATIENT_CLINIC_OR_DEPARTMENT_OTHER): Payer: Medicare Other

## 2018-03-17 DIAGNOSIS — I10 Essential (primary) hypertension: Secondary | ICD-10-CM

## 2018-03-17 DIAGNOSIS — R079 Chest pain, unspecified: Secondary | ICD-10-CM

## 2018-03-17 DIAGNOSIS — J410 Simple chronic bronchitis: Secondary | ICD-10-CM

## 2018-03-17 DIAGNOSIS — N183 Chronic kidney disease, stage 3 (moderate): Secondary | ICD-10-CM | POA: Diagnosis not present

## 2018-03-17 DIAGNOSIS — R0789 Other chest pain: Secondary | ICD-10-CM | POA: Diagnosis not present

## 2018-03-17 DIAGNOSIS — E785 Hyperlipidemia, unspecified: Secondary | ICD-10-CM | POA: Diagnosis not present

## 2018-03-17 DIAGNOSIS — R072 Precordial pain: Secondary | ICD-10-CM | POA: Diagnosis not present

## 2018-03-17 LAB — ECHOCARDIOGRAM COMPLETE
Height: 62 in
WEIGHTICAEL: 1904 [oz_av]

## 2018-03-17 MED ORDER — PANTOPRAZOLE SODIUM 20 MG PO TBEC: 20.0000 mg | DELAYED_RELEASE_TABLET | Freq: Every day | ORAL | 0 refills | Status: DC

## 2018-03-17 MED ORDER — PRAVASTATIN SODIUM 40 MG PO TABS: 80.0000 mg | ORAL_TABLET | Freq: Every day | ORAL | Status: DC

## 2018-03-17 NOTE — Discharge Summary (Signed)
Physician Discharge Summary  Catherine Phillips ONG:[id number] DOB: 02-Mar-1946 DOA: 03/15/2018  PCP: Donella Stade, PA-C  Admit date: 03/15/2018 Discharge date: 03/17/2018  Admitted From: Home Disposition: Home   Recommendations for Outpatient Follow-up:  1. Follow up with cardiology for risk stratification 2. Follow up with PCP, started PPI for GERD.  Home Health: None Equipment/Devices: None Discharge Condition: Stable CODE STATUS: Full Diet recommendation: Heart healthy  Brief/Interim Summary: Catherine Phillips is a 72 y.o. female with medical history significant of hypertension, COPD on 2 L nasal cannula oxygen at home, PAD, CKD 3, anxiety, who presents with chest pain.  Patient states that her chest pain started at about 7:30 PM.  It was located in substernal area, initially 10 out of 10 severity, currently 2 out of 10 severity, pressure-like pain, radiating right jaw and neck.  Patient states that she has chronic mild shortness of breath due to COPD, which has not changed.  No cough, fever or chills.  No tenderness in the calf areas.  Her chest pain has resolved after treated with ASA and NTG.  Patient does not have nausea vomiting, diarrhea, abdominal pain, symptoms of UTI or unilateral weakness.  ED Course: pt was found to have negative troponin, WBC 12.4, renal function close to baseline, temperature normal, no tachycardia, no tachypnea, oxygen satting 97% on 2 L nasal cannula oxygen, negative chest x-ray.  Patient is placed on telemetry bed of observation.  Hospital course: Symptoms remained resolved. Echocardiogram showed normal EF and no wall motion abnormalities and per cardiology is stable for discharge with plans for follow up [place] nuclear stress testing for risk stratification as an outpatient.   Discharge Diagnoses:  Principal Problem:   Chest pain Active Problems:   Essential hypertension   Hyperlipidemia LDL goal <70   COPD (chronic obstructive pulmonary disease)  (HCC)   Leukocytosis   CKD (chronic kidney disease), stage III (HCC)  Chest pain, atypical: Suspect more likely esophageal spasm with history of GERD and symptom history in addition to negative cardiac enzymes and unchanged subtle inf/lateral ST elevated. No wall motion abnormalities or LV dysfunction on echo.  - Start PPI scheduled - Per cardiology, will need lexiscan stress test for risk stratification but likely can accomplish this as outpatient.   Other medical conditions were stable during admission.  Discharge Instructions Discharge Instructions    Diet - low sodium heart healthy   Complete by:  As directed    Discharge instructions   Complete by:  As directed    It is suspected that you may have had an esophageal spasm related to GERD (reflux) to cause your symptoms. Your cardiac work up has been reassuring including no wall motion problems on your echocardiogram or impaired contracting function.  - Try taking protonix daily to help limit stomach acid - Don't lay down right after eating  - Follow up with cardiology as an outpatient for further work up. - If your symptoms return, seek medical attention right away.   Increase activity slowly   Complete by:  As directed      Allergies as of 03/17/2018      Reactions   Codeine Nausea And Vomiting   Levaquin [levofloxacin In D5w] Itching, Rash   Sulfamethoxazole-trimethoprim Other (See Comments)   Hyperkalemia (a high level of the electrolyte potassium in the blood)      Medication List    STOP taking these medications   azithromycin 250 MG tablet Commonly known as:  ZITHROMAX     TAKE  these medications   acetaminophen 500 MG tablet Commonly known as:  TYLENOL Take 1,000 mg by mouth every 6 (six) hours as needed (pain).   albuterol 108 (90 Base) MCG/ACT inhaler Commonly known as:  PROVENTIL HFA;VENTOLIN HFA Inhale 2 puffs into the lungs every 4 (four) hours as needed for wheezing or shortness of breath.   albuterol  (2.5 MG/3ML) 0.083% nebulizer solution Commonly known as:  PROVENTIL   AMBULATORY NON FORMULARY MEDICATION Hypoxia with Room Air at 83 percent. Needs 2L O2 as needed for O2 stats below 90.   amLODipine 5 MG tablet Commonly known as:  NORVASC Take 5 mg by mouth daily.   budesonide-formoterol 160-4.5 MCG/ACT inhaler Commonly known as:  SYMBICORT Inhale 2 puffs into the lungs 2 (two) times daily. What changed:    when to take this  reasons to take this   diclofenac sodium 1 % Gel Commonly known as:  VOLTAREN Apply 4 g topically 4 (four) times daily. To affected joint.   ipratropium-albuterol 0.5-2.5 (3) MG/3ML Soln Commonly known as:  DUONEB Take 3 mLs by nebulization every 4 (four) hours as needed.   labetalol 100 MG tablet Commonly known as:  NORMODYNE Take 100 mg by mouth 2 (two) times daily.   LIVALO 4 MG Tabs Generic drug:  Pitavastatin Calcium TAKE 1 TABLET EVERY OTHER DAY   LYRICA 100 MG capsule Generic drug:  pregabalin TAKE 1 CAPSULE BY MOUTH TWICE A DAY   nystatin 100000 UNIT/ML suspension Commonly known as:  MYCOSTATIN TAKE 4 MLS (400,000 UNITS TOTAL) BY MOUTH 4 (FOUR) TIMES DAILY.   pantoprazole 20 MG tablet Commonly known as:  PROTONIX Take 1 tablet (20 mg total) by mouth daily. Start taking on:  03/18/2018   Tiotropium Bromide Monohydrate 2.5 MCG/ACT Aers Commonly known as:  SPIRIVA RESPIMAT INHALE 2 PUFFS INTO THE LUNGS DAILY.      Follow-up Information    Lelon Perla, MD Follow up.   Specialty:  Cardiology Why:  The office will call you within 2-3 days to schedule follow-up. If you do not hear from them in this time-frame, please call the number provided.  Contact information: [address] [place] [address] [phone]        Donella Stade, PA-C Follow up.   Specialty:  Family Medicine Contact information: [address] [place] [address] [phone]          Allergies  Allergen  Reactions  . Codeine Nausea And Vomiting  . Levaquin [Levofloxacin In D5w] Itching and Rash  . Sulfamethoxazole-Trimethoprim Other (See Comments)    Hyperkalemia (a high level of the electrolyte potassium in the blood)    Consultations:  Cardiology, [place]  Procedures/Studies: Dg Chest Portable 1 View  Result Date: 03/15/2018 CLINICAL DATA:  Chest pain and pressure EXAM: PORTABLE CHEST 1 VIEW COMPARISON:  03/29/2017 FINDINGS: Normal heart size. No aortic aneurysm. Emphysematous hyperinflation of the lungs, upper lobe predominant with chronic bronchitic change of the lungs. No acute osseous abnormality. IMPRESSION: COPD without active pulmonary disease. Electronically Signed   By: Ashley Royalty M.D.   On: 03/15/2018 23:15   Echocardiogram 03/17/2018: - Left ventricle: The cavity size was normal. Wall thickness was   normal. Systolic function was normal. The estimated ejection   fraction was in the range of 60% to 65%. Wall motion was normal;   there were no regional wall motion abnormalities. Doppler   parameters are consistent with abnormal left ventricular   relaxation (  grade 1 diastolic dysfunction).  Impressions: - Normal LV systolic function; mild diastolic dysfunction.  Subjective: No chest pain, abdominal pain, dyspnea, or any other concerns. Really wants to go home.  Discharge Exam: Vitals:   03/16/18 2025 03/17/18 0559  BP: (!) 144/52 132/61  Pulse: 73 69  Resp: 18 19  Temp: 98.2 F (36.8 C) 99 F (37.2 C)  SpO2: 93% 95%   General: Pt is alert, awake, not in acute distress Cardiovascular: RRR, S1/S2 +, no rubs, no gallops Respiratory: CTA bilaterally, no wheezing, no rhonchi Abdominal: Soft, NT, ND, bowel sounds + Extremities: No edema, no cyanosis  Labs: BNP (last 3 results) No results for input(s): BNP in the last 8760 hours. Basic Metabolic Panel: Recent Labs  Lab 03/15/18 2214  NA 141  K 4.8  CL 105  CO2 27  GLUCOSE 124*  BUN 32*  CREATININE  1.50*  CALCIUM 8.9   Liver Function Tests: No results for input(s): AST, ALT, ALKPHOS, BILITOT, PROT, ALBUMIN in the last 168 hours. No results for input(s): LIPASE, AMYLASE in the last 168 hours. No results for input(s): AMMONIA in the last 168 hours. CBC: Recent Labs  Lab 03/15/18 2214  WBC 12.4*  HGB 13.7  HCT 43.6  MCV 100.0  PLT 246   Cardiac Enzymes: Recent Labs  Lab 03/16/18 0221 03/16/18 0807 03/16/18 1400  TROPONINI <0.03 <0.03 <0.03   BNP: Invalid input(s): POCBNP CBG: No results for input(s): GLUCAP in the last 168 hours. D-Dimer No results for input(s): DDIMER in the last 72 hours. Hgb A1c Recent Labs    03/16/18 0221  HGBA1C 6.4*   Lipid Profile Recent Labs    03/16/18 0221  CHOL 198  HDL 37*  LDLCALC 101*  TRIG 302*  CHOLHDL 5.4   Thyroid function studies No results for input(s): TSH, T4TOTAL, T3FREE, THYROIDAB in the last 72 hours.  Invalid input(s): FREET3 Anemia work up No results for input(s): VITAMINB12, FOLATE, FERRITIN, TIBC, IRON, RETICCTPCT in the last 72 hours. Urinalysis    Component Value Date/Time   COLORURINE YELLOW 03/16/2018 0808   APPEARANCEUR CLEAR 03/16/2018 0808   LABSPEC 1.009 03/16/2018 0808   PHURINE 6.0 03/16/2018 0808   GLUCOSEU NEGATIVE 03/16/2018 0808   HGBUR NEGATIVE 03/16/2018 0808   BILIRUBINUR NEGATIVE 03/16/2018 0808   BILIRUBINUR negative 05/16/2016 1434   KETONESUR NEGATIVE 03/16/2018 0808   PROTEINUR NEGATIVE 03/16/2018 0808   UROBILINOGEN 0.2 05/16/2016 1434   UROBILINOGEN 0.2 06/26/2015 1700   NITRITE NEGATIVE 03/16/2018 0808   LEUKOCYTESUR SMALL (A) 03/16/2018 0808    Microbiology No results found for this or any previous visit (from the past 240 hour(s)).  Time coordinating discharge: Approximately 40 minutes  Patrecia Pour, MD  [facility] 03/17/2018, 12:21 PM Pager [phone]

## 2018-03-17 NOTE — Progress Notes (Signed)
  Echocardiogram 2D Echocardiogram has been performed.  Catherine Phillips 03/17/2018, 11:09 AM

## 2018-03-17 NOTE — Progress Notes (Signed)
Progress Note  Patient Name: Catherine Phillips Date of Encounter: 03/17/2018  Primary Cardiologist: Kirk Ruths, MD   Subjective   No chest pain or dyspnea  Inpatient Medications    Scheduled Meds: . amLODipine  5 mg Oral Daily  . aspirin EC  325 mg Oral Daily  . diclofenac sodium  4 g Topical QID  . labetalol  100 mg Oral Daily  . mouth rinse  15 mL Mouth Rinse BID  . mometasone-formoterol  2 puff Inhalation BID  . pantoprazole  20 mg Oral Daily  . pravastatin  80 mg Oral q1800  . pregabalin  100 mg Oral BID  . tiotropium  18 mcg Inhalation Daily   Continuous Infusions:  PRN Meds: acetaminophen, albuterol, ALPRAZolam, dextromethorphan-guaiFENesin, ipratropium-albuterol, morphine injection, nitroGLYCERIN   Vital Signs    Vitals:   03/16/18 1514 03/16/18 1517 03/16/18 2025 03/17/18 0559  BP: (!) 92/56  (!) 144/52 132/61  Pulse: 68  73 69  Resp:   18 19  Temp: 98.2 F (36.8 C)  98.2 F (36.8 C) 99 F (37.2 C)  TempSrc: Oral  Oral Oral  SpO2: 95%  93% 95%  Weight:  119 lb 11.2 oz (54.3 kg)  119 lb (54 kg)  Height:  5\' 2"  (1.575 m)      Intake/Output Summary (Last 24 hours) at 03/17/2018 0910 Last data filed at 03/16/2018 1515 Gross per 24 hour  Intake 480 ml  Output -  Net 480 ml   Filed Weights   03/15/18 2212 03/16/18 1517 03/17/18 0559  Weight: 117 lb (53.1 kg) 119 lb 11.2 oz (54.3 kg) 119 lb (54 kg)    Telemetry    Sinus- Personally Reviewed   Physical Exam   GEN: No acute distress.   Neck: No JVD Cardiac: RRR, no murmurs, rubs, or gallops.  Respiratory: Diminished BS GI: Soft, nontender, non-distended  MS: No edema Neuro:  Nonfocal  Psych: Normal affect   Labs    Chemistry Recent Labs  Lab 03/15/18 2214  NA 141  K 4.8  CL 105  CO2 27  GLUCOSE 124*  BUN 32*  CREATININE 1.50*  CALCIUM 8.9  GFRNONAA 34*  GFRAA 39*  ANIONGAP 9     Hematology Recent Labs  Lab 03/15/18 2214  WBC 12.4*  RBC 4.36  HGB 13.7  HCT 43.6  MCV  100.0  MCH 31.4  MCHC 31.4  RDW 13.7  PLT 246    Cardiac Enzymes Recent Labs  Lab 03/16/18 0221 03/16/18 0807 03/16/18 1400  TROPONINI <0.03 <0.03 <0.03    Recent Labs  Lab 03/15/18 2258  TROPIPOC 0.00     Radiology    Dg Chest Portable 1 View  Result Date: 03/15/2018 CLINICAL DATA:  Chest pain and pressure EXAM: PORTABLE CHEST 1 VIEW COMPARISON:  03/29/2017 FINDINGS: Normal heart size. No aortic aneurysm. Emphysematous hyperinflation of the lungs, upper lobe predominant with chronic bronchitic change of the lungs. No acute osseous abnormality. IMPRESSION: COPD without active pulmonary disease. Electronically Signed   By: Ashley Royalty M.D.   On: 03/15/2018 23:15    Patient Profile     72 y.o. female admitted with chest pain.  Prior history of peripheral vascular disease, hypertension, hyperlipidemia, COPD and chronic stage III kidney disease.  Assessment & Plan    1 chest pain-symptoms are atypical.  Enzymes negative.  Await echocardiogram.  If negative patient can be discharged with outpatient nuclear study for risk stratification.  2 peripheral vascular disease-continue aspirin and would  treat with statin long-term.  3 hypertension-continue preadmission medications for blood pressure.  For questions or updates, please contact [place] Please consult [email or web address] for contact info under Cardiology/STEMI.      Signed, Kirk Ruths, MD  03/17/2018, 9:10 AM

## 2018-03-17 NOTE — Discharge Instructions (Signed)
Food Choices for Gastroesophageal Reflux Disease, Adult When you have gastroesophageal reflux disease (GERD), the foods you eat and your eating habits are very important. Choosing the right foods can help ease your discomfort. What guidelines do I need to follow?  Choose fruits, vegetables, whole grains, and low-fat dairy products.  Choose low-fat meat, fish, and poultry.  Limit fats such as oils, salad dressings, butter, nuts, and avocado.  Keep a food diary. This helps you identify foods that cause symptoms.  Avoid foods that cause symptoms. These may be different for everyone.  Eat small meals often instead of 3 large meals a day.  Eat your meals slowly, in a place where you are relaxed.  Limit fried foods.  Cook foods using methods other than frying.  Avoid drinking alcohol.  Avoid drinking large amounts of liquids with your meals.  Avoid bending over or lying down until 2-3 hours after eating. What foods are not recommended? These are some foods and drinks that may make your symptoms worse: Vegetables Tomatoes. Tomato juice. Tomato and spaghetti sauce. Chili peppers. Onion and garlic. Horseradish. Fruits Oranges, grapefruit, and lemon (fruit and juice). Meats High-fat meats, fish, and poultry. This includes hot dogs, ribs, ham, sausage, salami, and bacon. Dairy Whole milk and chocolate milk. Sour cream. Cream. Butter. Ice cream. Cream cheese. Drinks Coffee and tea. Bubbly (carbonated) drinks or energy drinks. Condiments Hot sauce. Barbecue sauce. Sweets/Desserts Chocolate and cocoa. Donuts. Peppermint and spearmint. Fats and Oils High-fat foods. This includes French fries and potato chips. Other Vinegar. Strong spices. This includes black pepper, white pepper, red pepper, cayenne, curry powder, cloves, ginger, and chili powder. The items listed above may not be a complete list of foods and drinks to avoid. Contact your dietitian for more information. This  information is not intended to replace advice given to you by your health care provider. Make sure you discuss any questions you have with your health care provider. Document Released: 03/05/2012 Document Revised: 02/10/2016 Document Reviewed: 07/09/2013 Elsevier Interactive Patient Education  2017 Elsevier Inc.  Gastroesophageal Reflux Disease, Adult Normally, food travels down the esophagus and stays in the stomach to be digested. If a person has gastroesophageal reflux disease (GERD), food and stomach acid move back up into the esophagus. When this happens, the esophagus becomes sore and swollen (inflamed). Over time, GERD can make small holes (ulcers) in the lining of the esophagus. Follow these instructions at home: Diet  Follow a diet as told by your doctor. You may need to avoid foods and drinks such as: ? Coffee and tea (with or without caffeine). ? Drinks that contain alcohol. ? Energy drinks and sports drinks. ? Carbonated drinks or sodas. ? Chocolate and cocoa. ? Peppermint and mint flavorings. ? Garlic and onions. ? Horseradish. ? Spicy and acidic foods, such as peppers, chili powder, curry powder, vinegar, hot sauces, and BBQ sauce. ? Citrus fruit juices and citrus fruits, such as oranges, lemons, and limes. ? Tomato-based foods, such as red sauce, chili, salsa, and pizza with red sauce. ? Fried and fatty foods, such as donuts, french fries, potato chips, and high-fat dressings. ? High-fat meats, such as hot dogs, rib eye steak, sausage, ham, and bacon. ? High-fat dairy items, such as whole milk, butter, and cream cheese.  Eat small meals often. Avoid eating large meals.  Avoid drinking large amounts of liquid with your meals.  Avoid eating meals during the 2-3 hours before bedtime.  Avoid lying down right after you eat.  Do   not exercise right after you eat. General instructions  Pay attention to any changes in your symptoms.  Take over-the-counter and prescription  medicines only as told by your doctor. Do not take aspirin, ibuprofen, or other NSAIDs unless your doctor says it is okay.  Do not use any tobacco products, including cigarettes, chewing tobacco, and e-cigarettes. If you need help quitting, ask your doctor.  Wear loose clothes. Do not wear anything tight around your waist.  Raise (elevate) the head of your bed about 6 inches (15 cm).  Try to lower your stress. If you need help doing this, ask your doctor.  If you are overweight, lose an amount of weight that is healthy for you. Ask your doctor about a safe weight loss goal.  Keep all follow-up visits as told by your doctor. This is important. Contact a doctor if:  You have new symptoms.  You lose weight and you do not know why it is happening.  You have trouble swallowing, or it hurts to swallow.  You have wheezing or a cough that keeps happening.  Your symptoms do not get better with treatment.  You have a hoarse voice. Get help right away if:  You have pain in your arms, neck, jaw, teeth, or back.  You feel sweaty, dizzy, or light-headed.  You have chest pain or shortness of breath.  You throw up (vomit) and your throw up looks like blood or coffee grounds.  You pass out (faint).  Your poop (stool) is bloody or black.  You cannot swallow, drink, or eat. This information is not intended to replace advice given to you by your health care provider. Make sure you discuss any questions you have with your health care provider. Document Released: 02/21/2008 Document Revised: 02/10/2016 Document Reviewed: 12/30/2014 Elsevier Interactive Patient Education  2018 Elsevier Inc.  

## 2018-04-04 ENCOUNTER — Encounter: Payer: Self-pay | Admitting: Cardiology

## 2018-04-04 ENCOUNTER — Ambulatory Visit (INDEPENDENT_AMBULATORY_CARE_PROVIDER_SITE_OTHER): Payer: Medicare Other | Admitting: Cardiology

## 2018-04-04 VITALS — BP 150/62 | HR 63 | Ht 62.0 in | Wt 120.0 lb

## 2018-04-04 DIAGNOSIS — E785 Hyperlipidemia, unspecified: Secondary | ICD-10-CM

## 2018-04-04 DIAGNOSIS — J439 Emphysema, unspecified: Secondary | ICD-10-CM | POA: Diagnosis not present

## 2018-04-04 DIAGNOSIS — I739 Peripheral vascular disease, unspecified: Secondary | ICD-10-CM | POA: Diagnosis not present

## 2018-04-04 DIAGNOSIS — I10 Essential (primary) hypertension: Secondary | ICD-10-CM | POA: Diagnosis not present

## 2018-04-04 DIAGNOSIS — I701 Atherosclerosis of renal artery: Secondary | ICD-10-CM

## 2018-04-04 DIAGNOSIS — N183 Chronic kidney disease, stage 3 unspecified: Secondary | ICD-10-CM

## 2018-04-04 DIAGNOSIS — G629 Polyneuropathy, unspecified: Secondary | ICD-10-CM

## 2018-04-04 DIAGNOSIS — R079 Chest pain, unspecified: Secondary | ICD-10-CM

## 2018-04-04 MED ORDER — ASPIRIN EC 81 MG PO TBEC: 81.0000 mg | DELAYED_RELEASE_TABLET | Freq: Every day | ORAL | 3 refills | Status: AC

## 2018-04-04 NOTE — Assessment & Plan Note (Signed)
Followed by [facility]. She is on O2 Q HS 

## 2018-04-04 NOTE — Progress Notes (Signed)
04/04/2018 Catherine Phillips   1945-12-27  [id number]  Primary Physician Donella Stade, PA-C Primary Cardiologist: Dr Stanford Breed  HPI:  72 y.o. 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  She She has chronic left external iliac occlusion by angiography in 2014 and known Rt RAS by doppler in 2017.  She is a former smoker and has COPD Gold 2- followed by [facility]. She is on O2 Q HS.  Other medical problems include polyneuropathy, HLD, and CRI-3 (GFR 34).  She was [facility] to [facility] with chest pain 03/15/18.  Der Stanford Breed saw her in consult. Echo and Troponin were negative. She is in the office today for follow up. The pt described SSCP "tightness" and heaviness "like a ton of bricks". She was discharged on a PPI and her symptoms have improved.    Current Outpatient Medications  Medication Sig Dispense Refill  . acetaminophen (TYLENOL) 500 MG tablet Take 1,000 mg by mouth every 6 (six) hours as needed (pain).    [name] albuterol (PROVENTIL HFA;VENTOLIN HFA) 108 (90 Base) MCG/ACT inhaler Inhale 2 puffs into the lungs every 4 (four) hours as needed for wheezing or shortness of breath. 1 Inhaler 11  . albuterol (PROVENTIL) (2.5 MG/3ML) 0.083% nebulizer solution     . AMBULATORY NON FORMULARY MEDICATION Hypoxia with Room Air at 83 percent. Needs 2L O2 as needed for O2 stats below 90. 1 Device 0  . amLODipine (NORVASC) 5 MG tablet Take 5 mg by mouth daily.     . budesonide-formoterol (SYMBICORT) 160-4.5 MCG/ACT inhaler Inhale 2 puffs into the lungs 2 (two) times daily. 1 Inhaler 0  . diclofenac sodium (VOLTAREN) 1 % GEL Apply 4 g topically 4 (four) times daily. To affected joint. 100 g 2  . ipratropium-albuterol (DUONEB) 0.5-2.5 (3) MG/3ML SOLN Take 3 mLs by nebulization every 4 (four) hours as needed. 360 mL 3  . labetalol (NORMODYNE) 100 MG tablet Take 100 mg by mouth 2 (two) times daily.     [name]  LIVALO 4 MG TABS TAKE 1 TABLET EVERY OTHER DAY 90 tablet 2  . LYRICA 100 MG capsule TAKE 1 CAPSULE BY MOUTH TWICE A DAY 180 capsule 1  . nystatin (MYCOSTATIN) 100000 UNIT/ML suspension TAKE 4 MLS (400,000 UNITS TOTAL) BY MOUTH 4 (FOUR) TIMES DAILY. 60 mL 0  . pantoprazole (PROTONIX) 20 MG tablet Take 1 tablet (20 mg total) by mouth daily. 30 tablet 0  . Tiotropium Bromide Monohydrate (SPIRIVA RESPIMAT) 2.5 MCG/ACT AERS INHALE 2 PUFFS INTO THE LUNGS DAILY. 12 g 3  . aspirin EC 81 MG tablet Take 1 tablet (81 mg total) by mouth daily. 90 tablet 3   No current facility-administered medications for this visit.     Allergies  Allergen Reactions  . Codeine Nausea And Vomiting  . Levaquin [Levofloxacin In D5w] Itching and Rash  . Sulfamethoxazole-Trimethoprim Other (See Comments)    Hyperkalemia (a high level of the electrolyte potassium in the blood)    Past Medical History:  Diagnosis Date  . Anxiety 06/24/2012  . COPD (chronic obstructive pulmonary disease) ([place])   . Diverticulosis   . Emphysema 06/24/2012  . Emphysema of lung ([place])   . Essential hypertension 06/24/2012  . History of Left leg claudication 06/24/2012  . Hypertension   . Peripheral vascular disease ([place])   . Renal disorder    Renal Insufficiency R=30%  .  Shortness of breath dyspnea     Social History   Socioeconomic History  . Marital status: Married    Spouse name: Not on file  . Number of children: Not on file  . Years of education: Not on file  . Highest education level: Not on file  Occupational History  . Not on file  Social Needs  . Financial resource strain: Not on file  . Food insecurity:    Worry: Not on file    Inability: Not on file  . Transportation needs:    Medical: Not on file    Non-medical: Not on file  Tobacco Use  . Smoking status: Former Smoker    Packs/day: 1.50    Years: 30.00    Pack years: 45.00    Types: Cigarettes    Last attempt to quit: 12/30/2009    Years since quitting: 8.2   . Smokeless tobacco: Never Used  Substance and Sexual Activity  . Alcohol use: No  . Drug use: No  . Sexual activity: Not on file  Lifestyle  . Physical activity:    Days per week: Not on file    Minutes per session: Not on file  . Stress: Not on file  Relationships  . Social connections:    Talks on phone: Not on file    Gets together: Not on file    Attends religious service: Not on file    Active member of club or organization: Not on file    Attends meetings of clubs or organizations: Not on file    Relationship status: Not on file  . Intimate partner violence:    Fear of current or ex partner: Not on file    Emotionally abused: Not on file    Physically abused: Not on file    Forced sexual activity: Not on file  Other Topics Concern  . Not on file  Social History Narrative  . Not on file     Family History  Problem Relation Age of Onset  . Cancer Mother   . Heart attack Father   . Deep vein thrombosis Father   . Hyperlipidemia Father   . Hypertension Father      Review of Systems: General: negative for chills, fever, night sweats or weight changes.  Cardiovascular: negative for chest pain, dyspnea on exertion, edema, orthopnea, palpitations, paroxysmal nocturnal dyspnea or shortness of breath Dermatological: negative for rash Respiratory: negative for cough or wheezing Urologic: negative for hematuria Abdominal: negative for nausea, vomiting, diarrhea, bright red blood per rectum, melena, or hematemesis Neurologic: negative for visual changes, syncope, or dizziness All other systems reviewed and are otherwise negative except as noted above.    Blood pressure (!) 150/62, pulse 63, height 5\' 2"  (1.575 m), weight 120 lb (54.4 kg), SpO2 90 %.  General appearance: alert, cooperative, appears older than stated age and no distress Neck: no carotid bruit and no JVD Lungs: decreased breath sounds overall- no wheezing Heart: regular rate and rhythm Abdomen: soft,  non-tender; bowel sounds normal; no masses,  no organomegaly and no bruits Extremities: extremities normal, atraumatic, no cyanosis or edema Pulses: diminnished distal pulses, Rt FA bruit noted Skin: Skin color, texture, turgor normal. No rashes or lesions Neurologic: Grossly normal   ASSESSMENT AND PLAN:   Chest pain with moderate risk of acute coronary syndrome Pt admitted in June 2019 with chest pain- MI r/o but multiple CV risk factors including PVD  COPD (chronic obstructive pulmonary disease) with emphysema Gold B Followed  by [facility]. She is on O2 Q HS  Essential hypertension Controlled. Grade 1 DD on echo  Hyperlipidemia LDL goal <70 LDL 101 June 2019- on Livalo  Peripheral vascular disease ([place]) Known LIA occlusion and Rt RAS. Followed by Dr Dr Doren Custard  Renal artery stenosis [facility]) 60-99% by doppler Oct 2017. Right kidney was small. Dr Doren Custard follow  Peripheral neuropathy She has symptoms of claudication and LE neuropathy   PLAN  Add ASA 81 mg. OP Myoview and f/u with Dr Stanford Breed in 3 months.   Kerin Ransom PA-C 04/04/2018 9:10 AM

## 2018-04-04 NOTE — Assessment & Plan Note (Signed)
She has symptoms of claudication and LE neuropathy

## 2018-04-04 NOTE — Assessment & Plan Note (Signed)
60-99% by doppler Oct 2017. Right kidney was small. Dr Doren Custard follow

## 2018-04-04 NOTE — Assessment & Plan Note (Signed)
Pt admitted in June 2019 with chest pain- MI r/o but multiple CV risk factors including PVD

## 2018-04-04 NOTE — Assessment & Plan Note (Signed)
LDL 101 June 2019- on Livalo

## 2018-04-04 NOTE — Assessment & Plan Note (Signed)
Controlled. Grade 1 DD on echo

## 2018-04-04 NOTE — Assessment & Plan Note (Signed)
Known LIA occlusion and Rt RAS. Followed by Dr Dr Doren Custard

## 2018-04-04 NOTE — Patient Instructions (Addendum)
Medication Instructions:  Start aspirin 81 mg daily  Testing/Procedures: Your physician has requested that you have a lexiscan myoview. For further information please visit [email or web address]. Please follow instruction sheet, as given.  Follow-Up: 3 months with Dr. Stanford Breed  Any Other Special Instructions Will Be Listed Below (If Applicable).     If you need a refill on your cardiac medications before your next appointment, please call your pharmacy.

## 2018-04-17 ENCOUNTER — Telehealth (HOSPITAL_COMMUNITY): Payer: Self-pay

## 2018-04-17 NOTE — Telephone Encounter (Signed)
Encounter complete. 

## 2018-04-18 ENCOUNTER — Telehealth (HOSPITAL_COMMUNITY): Payer: Self-pay

## 2018-04-18 ENCOUNTER — Ambulatory Visit (HOSPITAL_BASED_OUTPATIENT_CLINIC_OR_DEPARTMENT_OTHER)
Admission: RE | Admit: 2018-04-18 | Discharge: 2018-04-18 | Disposition: A | Discharge status: Home / Self Care | Payer: Medicare Other | Source: Ambulatory Visit | Attending: Adult Health | Admitting: Adult Health

## 2018-04-18 DIAGNOSIS — J432 Centrilobular emphysema: Secondary | ICD-10-CM | POA: Insufficient documentation

## 2018-04-18 DIAGNOSIS — I2510 Atherosclerotic heart disease of native coronary artery without angina pectoris: Secondary | ICD-10-CM | POA: Insufficient documentation

## 2018-04-18 DIAGNOSIS — I700 Atherosclerosis of aorta: Secondary | ICD-10-CM | POA: Diagnosis not present

## 2018-04-18 DIAGNOSIS — R590 Localized enlarged lymph nodes: Secondary | ICD-10-CM | POA: Diagnosis not present

## 2018-04-18 DIAGNOSIS — R918 Other nonspecific abnormal finding of lung field: Secondary | ICD-10-CM | POA: Insufficient documentation

## 2018-04-18 NOTE — Telephone Encounter (Signed)
Encounter complete. 

## 2018-04-19 ENCOUNTER — Ambulatory Visit (HOSPITAL_COMMUNITY)
Admission: RE | Admit: 2018-04-19 | Discharge: 2018-04-19 | Disposition: A | Discharge status: Home / Self Care | Payer: Medicare Other | Source: Ambulatory Visit | Attending: Cardiology | Admitting: Cardiology

## 2018-04-19 DIAGNOSIS — R079 Chest pain, unspecified: Secondary | ICD-10-CM | POA: Insufficient documentation

## 2018-04-19 LAB — MYOCARDIAL PERFUSION IMAGING
LV dias vol: 88 mL (ref 46–106)
LV sys vol: 43 mL
Peak HR: 82 {beats}/min
Rest HR: 60 {beats}/min
SDS: 0
SRS: 2
SSS: 2
TID: 1.14

## 2018-04-19 MED ORDER — REGADENOSON 0.4 MG/5ML IV SOLN
0.4000 mg | Freq: Once | INTRAVENOUS | Status: AC
  Administered 2018-04-19: 0.4 mg via INTRAVENOUS

## 2018-04-19 MED ORDER — TECHNETIUM TC 99M TETROFOSMIN IV KIT
31.6000 | PACK | Freq: Once | INTRAVENOUS | Status: AC | PRN
  Administered 2018-04-19: 31.6 via INTRAVENOUS
  Filled 2018-04-19: qty 32

## 2018-04-19 MED ORDER — TECHNETIUM TC 99M TETROFOSMIN IV KIT
10.3000 | PACK | Freq: Once | INTRAVENOUS | Status: AC | PRN
  Administered 2018-04-19: 10.3 via INTRAVENOUS
  Filled 2018-04-19: qty 11

## 2018-04-19 NOTE — Progress Notes (Signed)
Called spoke with patient, advised of CT results / recs as stated by TP.  Pt verbalized understanding and denied any questions.  Per last office visit, TP said follow up in 6 months.  There is a recall for December 2019 - will keep this.

## 2018-04-25 ENCOUNTER — Telehealth: Payer: Self-pay | Admitting: Cardiology

## 2018-04-25 NOTE — Telephone Encounter (Signed)
New Message: ° ° ° ° ° ° °Pt is returning a call for test results ° °

## 2018-04-25 NOTE — Telephone Encounter (Signed)
Patient notified of stress test results and voiced understanding.

## 2018-05-24 ENCOUNTER — Ambulatory Visit (INDEPENDENT_AMBULATORY_CARE_PROVIDER_SITE_OTHER): Payer: Medicare Other | Admitting: Physician Assistant

## 2018-05-24 ENCOUNTER — Encounter: Payer: Self-pay | Admitting: Physician Assistant

## 2018-05-24 VITALS — BP 155/61 | HR 74 | Ht 62.0 in | Wt 120.0 lb

## 2018-05-24 DIAGNOSIS — Z1159 Encounter for screening for other viral diseases: Secondary | ICD-10-CM

## 2018-05-24 DIAGNOSIS — R252 Cramp and spasm: Secondary | ICD-10-CM

## 2018-05-24 DIAGNOSIS — I10 Essential (primary) hypertension: Secondary | ICD-10-CM | POA: Diagnosis not present

## 2018-05-24 DIAGNOSIS — I701 Atherosclerosis of renal artery: Secondary | ICD-10-CM

## 2018-05-24 DIAGNOSIS — R072 Precordial pain: Secondary | ICD-10-CM | POA: Diagnosis not present

## 2018-05-24 DIAGNOSIS — K2100 Gastro-esophageal reflux disease with esophagitis, without bleeding: Secondary | ICD-10-CM

## 2018-05-24 DIAGNOSIS — Z23 Encounter for immunization: Secondary | ICD-10-CM

## 2018-05-24 DIAGNOSIS — I739 Peripheral vascular disease, unspecified: Secondary | ICD-10-CM | POA: Diagnosis not present

## 2018-05-24 DIAGNOSIS — Z1231 Encounter for screening mammogram for malignant neoplasm of breast: Secondary | ICD-10-CM | POA: Diagnosis not present

## 2018-05-24 DIAGNOSIS — M810 Age-related osteoporosis without current pathological fracture: Secondary | ICD-10-CM | POA: Diagnosis not present

## 2018-05-24 MED ORDER — PANTOPRAZOLE SODIUM 20 MG PO TBEC: 20.0000 mg | DELAYED_RELEASE_TABLET | Freq: Every day | ORAL | 1 refills | Status: DC

## 2018-05-24 NOTE — Patient Instructions (Addendum)
d3 2000 units a day. 1300mg  of calcium a day 4 servings of diary.  Will call with prolia when approved.  protonix as little as corrects symptoms.  Follow up with any worsening symptoms.    Peripheral Vascular Disease Peripheral vascular disease (PVD) is a disease of the blood vessels that are not part of your heart and brain. A simple term for PVD is poor circulation. In most cases, PVD narrows the blood vessels that carry blood from your heart to the rest of your body. This can result in a decreased supply of blood to your arms, legs, and internal organs, like your stomach or kidneys. However, it most often affects a person's lower legs and feet. There are two types of PVD.  Organic PVD. This is the more common type. It is caused by damage to the structure of blood vessels.  Functional PVD. This is caused by conditions that make blood vessels contract and tighten (spasm).  Without treatment, PVD tends to get worse over time. PVD can also lead to acute ischemic limb. This is when an arm or limb suddenly has trouble getting enough blood. This is a medical emergency. Follow these instructions at home:  Take medicines only as told by your doctor.  Do not use any tobacco products, including cigarettes, chewing tobacco, or electronic cigarettes. If you need help quitting, ask your doctor.  Lose weight if you are overweight, and maintain a healthy weight as told by your doctor.  Eat a diet that is low in fat and cholesterol. If you need help, ask your doctor.  Exercise regularly. Ask your doctor for some good activities for you.  Take good care of your feet. ? Wear comfortable shoes that fit well. ? Check your feet often for any cuts or sores. Contact a doctor if:  You have cramps in your legs while walking.  You have leg pain when you are at rest.  You have coldness in a leg or foot.  Your skin changes.  You are unable to get or have an erection (erectile dysfunction).  You have  cuts or sores on your feet that are not healing. Get help right away if:  Your arm or leg turns cold and blue.  Your arms or legs become red, warm, swollen, painful, or numb.  You have chest pain or trouble breathing.  You suddenly have weakness in your face, arm, or leg.  You become very confused or you cannot speak.  You suddenly have a very bad headache.  You suddenly cannot see. This information is not intended to replace advice given to you by your health care provider. Make sure you discuss any questions you have with your health care provider. Document Released: 11/29/2009 Document Revised: 02/10/2016 Document Reviewed: 02/12/2014 Elsevier Interactive Patient Education  2017 [employer].

## 2018-05-24 NOTE — Progress Notes (Signed)
Subjective:    Patient ID: Catherine Phillips, female    DOB: [birth year], 72 y.o.   MRN: [id number]  HPI  Pt is a 72 yo female with HTN, PVD, RAS, COPD, osteoporosis and GERD who presents to the clinic to discuss chest pain that has reoccurred.   Pt went to the ED on 03/15/2018 for CP due to her risk factors for CV disease she had a myocardial perfusion test that was normal. She was given protonix for indigestion and GERD. All symptoms cleared. She took it until RX ran out. Symptoms started reoccuring. She would like a prescription refill.   Pt does report having increase in bilateral muscle cramping and pain. She does have PVD. She is on livalo every other day. She does work 33 a week at [employer]. She notices the pain is worst with exertion.   .. Active Ambulatory Problems    Diagnosis Date Noted  . History of Left leg claudication 06/24/2012  . Essential hypertension 06/24/2012  . Anxiety 06/24/2012  . Hyperlipidemia LDL goal <70 06/26/2012  . Osteoporosis 09/04/2012  . GERD (gastroesophageal reflux disease) 10/04/2012  . Pain in limb 12/04/2012  . Atherosclerosis of native artery of extremity with intermittent claudication ([place]) 12/04/2012  . Peripheral vascular disease ([place]) 01/22/2013  . COPD (chronic obstructive pulmonary disease) with emphysema Gold B 08/22/2013  . Peripheral neuropathy 10/28/2013  . Pulmonary nodules 04/28/2014  . Gastritis 12/03/2014  . Osteoarthritis, hand 04/19/2015  . COPD (chronic obstructive pulmonary disease) ([place]) 05/02/2015  . Leukocytosis 05/02/2015  . SOB (shortness of breath) 05/02/2015  . Dehydration 05/02/2015  . AKI (acute kidney injury) ([place]) 05/02/2015  . Epigastric abdominal pain 04/25/2016  . Hyperkalemia 05/22/2016  . Medication reaction 05/31/2016  . Renal artery stenosis ([place]) 06/21/2016  . Acute respiratory failure with hypoxia ([place]) 12/23/2016  . COPD exacerbation ([place]) 12/23/2016  . Nocturnal hypoxia 03/01/2018  . Chest pain with  moderate risk of acute coronary syndrome 03/16/2018  . CKD (chronic kidney disease), stage III ([place]) 03/16/2018  . Precordial chest pain 05/26/2018   Resolved Ambulatory Problems    Diagnosis Date Noted  . COPD exacerbation ([place]) 08/09/2014  . CAP (community acquired pneumonia) 08/09/2014  . COPD exacerbation ([place]) 05/02/2015   Past Medical History:  Diagnosis Date  . Diverticulosis   . Emphysema 06/24/2012  . Emphysema of lung ([place])   . Hypertension   . Renal disorder   . Shortness of breath dyspnea          Review of Systems  All other systems reviewed and are negative.      Objective:   Physical Exam  Constitutional: She is oriented to person, place, and time. She appears well-developed and well-nourished.  Cardiovascular: Normal rate and regular rhythm.  Pulmonary/Chest: Effort normal and breath sounds normal.  Neurological: She is alert and oriented to person, place, and time.  Psychiatric: She has a normal mood and affect. Her behavior is normal.          Assessment & Plan:  Marland KitchenMarland KitchenDiagnoses and all orders for this visit:  Precordial chest pain -     pantoprazole (PROTONIX) 20 MG tablet; Take 1 tablet (20 mg total) by mouth daily.  Muscle cramping -     BASIC METABOLIC PANEL WITH GFR  Visit for screening mammogram -     MM 3D SCREEN BREAST BILATERAL  Need for hepatitis C screening test -     Hepatitis C Antibody  Peripheral vascular disease (HCC)  Essential hypertension  Age-related osteoporosis without current pathological fracture  Gastroesophageal reflux disease with esophagitis -     pantoprazole (PROTONIX) 20 MG tablet; Take 1 tablet (20 mg total) by mouth daily.  Need for influenza vaccination -     Flu vaccine HIGH DOSE PF   Symptoms seem consisent with GERD. Restart protonix. Discussed side effects on bone density. Pt has osteoporosis. She is not on medication. Discussed medication. Agrees to start prolia. Will order. Discussed to use  lowest most effective dose of protonix to control symptoms. Discussed diet changes as well. Follow up if chest pain worsening or not improving.   Will check electrolytes for recent worsening of muscle cramping. Pt has PVD so claudication is likely. On ASA. I do think she needs to make sure she is drinking enough water. I suggested she limit hours at work. HO given. If continues we could send to vascular to see if any other intervention could be done.   [name].Spent 30 minutes with patient and greater than 50 percent of visit spent counseling patient regarding treatment plan.

## 2018-05-25 LAB — BASIC METABOLIC PANEL WITH GFR
BUN / CREAT RATIO: 23 (calc) — AB (ref 6–22)
BUN: 35 mg/dL — AB (ref 7–25)
CO2: 23 mmol/L (ref 20–32)
Calcium: 10.1 mg/dL (ref 8.6–10.4)
Chloride: 107 mmol/L (ref 98–110)
Creat: 1.51 mg/dL — ABNORMAL HIGH (ref 0.60–0.93)
GFR, EST AFRICAN AMERICAN: 40 mL/min/{1.73_m2} — AB (ref >=60)
GFR, Est Non African American: 34 mL/min/{1.73_m2} — ABNORMAL LOW (ref >=60)
GLUCOSE: 81 mg/dL (ref 65–99)
POTASSIUM: 4.8 mmol/L (ref 3.5–5.3)
SODIUM: 142 mmol/L (ref 135–146)

## 2018-05-25 LAB — HEPATITIS C ANTIBODY
HEP C AB: NONREACTIVE
SIGNAL TO CUT-OFF: 0.02 (ref <1.00)

## 2018-05-26 ENCOUNTER — Telehealth: Payer: Self-pay | Admitting: Physician Assistant

## 2018-05-26 ENCOUNTER — Encounter: Payer: Self-pay | Admitting: Physician Assistant

## 2018-05-26 DIAGNOSIS — R072 Precordial pain: Secondary | ICD-10-CM | POA: Insufficient documentation

## 2018-05-26 DIAGNOSIS — R252 Cramp and spasm: Secondary | ICD-10-CM | POA: Insufficient documentation

## 2018-05-26 NOTE — Telephone Encounter (Signed)
Pt has osteoporosis need to start prolia. Can we get this ordered?

## 2018-05-27 NOTE — Telephone Encounter (Signed)
Prolia ready. As long as labs look good. Appears she has medicare RWB, which requires no auth. She will be good to schedule.

## 2018-05-28 NOTE — Progress Notes (Signed)
Call pt: kidney function stable. Electrolytes look good.  If continues to have bilateral leg cramping we can send to vascular to see if any intervention could be done to improve pain.

## 2018-05-28 NOTE — Telephone Encounter (Signed)
Advised husband to have patient call back to schedule.

## 2018-05-30 ENCOUNTER — Ambulatory Visit (INDEPENDENT_AMBULATORY_CARE_PROVIDER_SITE_OTHER): Payer: Medicare Other | Admitting: Family Medicine

## 2018-05-30 VITALS — BP 166/54 | HR 69 | Wt 122.0 lb

## 2018-05-30 DIAGNOSIS — I701 Atherosclerosis of renal artery: Secondary | ICD-10-CM

## 2018-05-30 DIAGNOSIS — I10 Essential (primary) hypertension: Secondary | ICD-10-CM

## 2018-05-30 DIAGNOSIS — M810 Age-related osteoporosis without current pathological fracture: Secondary | ICD-10-CM | POA: Diagnosis not present

## 2018-05-30 MED ORDER — AMLODIPINE BESYLATE 10 MG PO TABS: 10.0000 mg | ORAL_TABLET | Freq: Every day | ORAL | 3 refills | Status: AC

## 2018-05-30 MED ORDER — DENOSUMAB 60 MG/ML ~~LOC~~ SOSY
60.0000 mg | PREFILLED_SYRINGE | Freq: Once | SUBCUTANEOUS | Status: AC
Start: 2018-05-30 — End: 2018-05-30
  Administered 2018-05-30: 60 mg via SUBCUTANEOUS

## 2018-05-30 NOTE — Progress Notes (Signed)
Agree with documentation as above.   Catherine Vasudevan, MD  

## 2018-05-30 NOTE — Progress Notes (Signed)
   Subjective:    Patient ID: Catherine Phillips, female    DOB: 12-31-1945, 72 y.o.   MRN: [id number]  HPI  Catherine Phillips is here for a Prolia injection. Blood pressure elevated today. Denies chest pain, shortness of breath or headaches.   Review of Systems     Objective:   Physical Exam        Assessment & Plan:  Osteoporosis - Patient tolerated injection well without complications. Patient advised to schedule next injection 6 months from today.   Hypertension - Per Dr Madilyn Fireman, Patient advised to increase amlodipine to 10 mg once daily. Recheck in 2 weeks for blood pressure check on nurse visit.

## 2018-05-30 NOTE — Patient Instructions (Addendum)
Increase amlodipine to 10 mg daily. Follow up in 2 weeks for blood pressure check on the nurse schedule. Schedule next Prolia in 6 months.    Hypertension Hypertension is another name for high blood pressure. High blood pressure forces your heart to work harder to pump blood. This can cause problems over time. There are two numbers in a blood pressure reading. There is a top number (systolic) over a bottom number (diastolic). It is best to have a blood pressure below 120/80. Healthy choices can help lower your blood pressure. You may need medicine to help lower your blood pressure if:  Your blood pressure cannot be lowered with healthy choices.  Your blood pressure is higher than 130/80.  Follow these instructions at home: Eating and drinking  If directed, follow the DASH eating plan. This diet includes: ? Filling half of your plate at each meal with fruits and vegetables. ? Filling one quarter of your plate at each meal with whole grains. Whole grains include whole wheat pasta, brown rice, and whole grain bread. ? Eating or drinking low-fat dairy products, such as skim milk or low-fat yogurt. ? Filling one quarter of your plate at each meal with low-fat (lean) proteins. Low-fat proteins include fish, skinless chicken, eggs, beans, and tofu. ? Avoiding fatty meat, cured and processed meat, or chicken with skin. ? Avoiding premade or processed food.  Eat less than 1,500 mg of salt (sodium) a day.  Limit alcohol use to no more than 1 drink a day for nonpregnant women and 2 drinks a day for men. One drink equals 12 oz of beer, 5 oz of wine, or 1 oz of hard liquor. Lifestyle  Work with your doctor to stay at a healthy weight or to lose weight. Ask your doctor what the best weight is for you.  Get at least 30 minutes of exercise that causes your heart to beat faster (aerobic exercise) most days of the week. This may include walking, swimming, or biking.  Get at least 30 minutes of  exercise that strengthens your muscles (resistance exercise) at least 3 days a week. This may include lifting weights or pilates.  Do not use any products that contain nicotine or tobacco. This includes cigarettes and e-cigarettes. If you need help quitting, ask your doctor.  Check your blood pressure at home as told by your doctor.  Keep all follow-up visits as told by your doctor. This is important. Medicines  Take over-the-counter and prescription medicines only as told by your doctor. Follow directions carefully.  Do not skip doses of blood pressure medicine. The medicine does not work as well if you skip doses. Skipping doses also puts you at risk for problems.  Ask your doctor about side effects or reactions to medicines that you should watch for. Contact a doctor if:  You think you are having a reaction to the medicine you are taking.  You have headaches that keep coming back (recurring).  You feel dizzy.  You have swelling in your ankles.  You have trouble with your vision. Get help right away if:  You get a very bad headache.  You start to feel confused.  You feel weak or numb.  You feel faint.  You get very bad pain in your: ? Chest. ? Belly (abdomen).  You throw up (vomit) more than once.  You have trouble breathing. Summary  Hypertension is another name for high blood pressure.  Making healthy choices can help lower blood pressure. If your blood  pressure cannot be controlled with healthy choices, you may need to take medicine. This information is not intended to replace advice given to you by your health care provider. Make sure you discuss any questions you have with your health care provider. Document Released: 02/21/2008 Document Revised: 08/02/2016 Document Reviewed: 08/02/2016 Elsevier Interactive Patient Education  [employer].

## 2018-05-31 ENCOUNTER — Ambulatory Visit (INDEPENDENT_AMBULATORY_CARE_PROVIDER_SITE_OTHER): Payer: Medicare Other

## 2018-05-31 DIAGNOSIS — Z1231 Encounter for screening mammogram for malignant neoplasm of breast: Secondary | ICD-10-CM

## 2018-05-31 NOTE — Progress Notes (Signed)
Call pt: normal mammogram. Follow up in 1-2 years.

## 2018-06-05 ENCOUNTER — Encounter: Payer: Self-pay | Admitting: Physician Assistant

## 2018-06-05 ENCOUNTER — Ambulatory Visit (INDEPENDENT_AMBULATORY_CARE_PROVIDER_SITE_OTHER): Payer: Medicare Other | Admitting: Physician Assistant

## 2018-06-05 VITALS — BP 131/72 | HR 75 | Temp 98.2°F | Ht 62.0 in | Wt 124.0 lb

## 2018-06-05 DIAGNOSIS — J9601 Acute respiratory failure with hypoxia: Secondary | ICD-10-CM | POA: Diagnosis not present

## 2018-06-05 DIAGNOSIS — J441 Chronic obstructive pulmonary disease with (acute) exacerbation: Secondary | ICD-10-CM

## 2018-06-05 DIAGNOSIS — J439 Emphysema, unspecified: Secondary | ICD-10-CM | POA: Insufficient documentation

## 2018-06-05 DIAGNOSIS — J432 Centrilobular emphysema: Secondary | ICD-10-CM | POA: Diagnosis not present

## 2018-06-05 DIAGNOSIS — I701 Atherosclerosis of renal artery: Secondary | ICD-10-CM | POA: Diagnosis not present

## 2018-06-05 MED ORDER — ALBUTEROL SULFATE HFA 108 (90 BASE) MCG/ACT IN AERS: 1.0000 | INHALATION_SPRAY | RESPIRATORY_TRACT | 3 refills | Status: DC | PRN

## 2018-06-05 MED ORDER — AZITHROMYCIN 250 MG PO TABS: ORAL_TABLET | ORAL | 0 refills | Status: DC

## 2018-06-05 MED ORDER — PREDNISONE 20 MG PO TABS: ORAL_TABLET | ORAL | 0 refills | Status: DC

## 2018-06-05 MED ORDER — FLUTICASONE-UMECLIDIN-VILANT 100-62.5-25 MCG/INH IN AEPB: 1.0000 | INHALATION_SPRAY | Freq: Every day | RESPIRATORY_TRACT | 5 refills | Status: AC

## 2018-06-05 MED ORDER — METHYLPREDNISOLONE SODIUM SUCC 125 MG IJ SOLR
125.0000 mg | Freq: Once | INTRAMUSCULAR | Status: AC
  Administered 2018-06-05: 125 mg via INTRAMUSCULAR

## 2018-06-05 NOTE — Progress Notes (Signed)
Subjective:     Patient ID: Catherine Phillips, female   DOB: January 14, 1946, 72 y.o.   MRN: [id number]  HPI Patient is a 72 yo female with a history of COPD and acute respiratory failure presenting today with complaints of difficulty breathing and low oxygen levels. Patient states that she has noticed her O2 sat is low in the mornings when she wakes up. Patient sleeps with oxygen and reports that for the past couple days she has been waking up, taking her pulse ox, and noticing that it is low. Today it was 67%. She does not think the nasal cannula is falling out when she sleeps and always wakes up with it inserted in her nose. Patient's husband says that he checked the machine and noticed it was set at 4 instead of 2 so he reset it this morning. Patient denies any cough, congestion, or signs of infection. She denies any chest pain. Patient has noticed that daily activities have seemed to be a little harder than usual over the past few days. Patient has been using her nebulizer when she wakes up. She requested a Proair inhaler as this has been useful to her in the past. Patient states she has been using her Spiriva inhaler daily but has not been using Symbicort.   .. Active Ambulatory Problems    Diagnosis Date Noted  . History of Left leg claudication 06/24/2012  . Essential hypertension 06/24/2012  . Anxiety 06/24/2012  . Hyperlipidemia LDL goal <70 06/26/2012  . Osteoporosis 09/04/2012  . GERD (gastroesophageal reflux disease) 10/04/2012  . Pain in limb 12/04/2012  . Atherosclerosis of native artery of extremity with intermittent claudication ([place]) 12/04/2012  . Peripheral vascular disease ([place]) 01/22/2013  . COPD (chronic obstructive pulmonary disease) with emphysema Gold B 08/22/2013  . Peripheral neuropathy 10/28/2013  . Pulmonary nodules 04/28/2014  . Gastritis 12/03/2014  . Osteoarthritis, hand 04/19/2015  . COPD (chronic obstructive pulmonary disease) ([place]) 05/02/2015  . Leukocytosis  05/02/2015  . SOB (shortness of breath) 05/02/2015  . Dehydration 05/02/2015  . AKI (acute kidney injury) ([place]) 05/02/2015  . Epigastric abdominal pain 04/25/2016  . Hyperkalemia 05/22/2016  . Medication reaction 05/31/2016  . Renal artery stenosis ([place]) 06/21/2016  . Acute respiratory failure with hypoxia ([place]) 12/23/2016  . COPD exacerbation ([place]) 12/23/2016  . Nocturnal hypoxia 03/01/2018  . Chest pain with moderate risk of acute coronary syndrome 03/16/2018  . CKD (chronic kidney disease), stage III ([place]) 03/16/2018  . Precordial chest pain 05/26/2018  . Muscle cramping 05/26/2018  . Emphysema of lung ([place]) 06/05/2018   Resolved Ambulatory Problems    Diagnosis Date Noted  . COPD exacerbation ([place]) 08/09/2014  . CAP (community acquired pneumonia) 08/09/2014  . COPD exacerbation ([place]) 05/02/2015   Past Medical History:  Diagnosis Date  . Diverticulosis   . Emphysema 06/24/2012  . Hypertension   . Renal disorder   . Shortness of breath dyspnea      Review of Systems  Constitutional: Negative for fever.  HENT: Negative for congestion, sinus pressure and sinus pain.   Respiratory: Positive for shortness of breath. Negative for cough.   Cardiovascular: Negative for chest pain.       Objective:   Physical Exam  Constitutional: She is oriented to person, place, and time. She appears well-developed.  Weak, distressed up on arrival.   HENT:  Head: Normocephalic and atraumatic.  Cardiovascular: Normal rate, regular rhythm and normal heart sounds.  Pulmonary/Chest: Breath sounds normal. No stridor. She has no wheezes.  Decreased air movement. No wheezing or rhonchi.  Pulse ox 85 without O2. With 2L O2 95 percent.   Neurological: She is alert and oriented to person, place, and time.  Skin: No rash noted.  Psychiatric: She has a normal mood and affect. Her behavior is normal.       Assessment:     Marland KitchenMarland KitchenDiagnoses and all orders for this visit:  Acute respiratory  failure with hypoxia (HCC) -     albuterol (PROVENTIL HFA;VENTOLIN HFA) 108 (90 Base) MCG/ACT inhaler; Inhale 1-2 puffs into the lungs every 4 (four) hours as needed for wheezing or shortness of breath. -     predniSONE (DELTASONE) 20 MG tablet; Take 3 tablets for 3 days, take 2 tablets for 2 days, take 1 tablet for 3 days, take 1/2 tablet for 4 days. -     azithromycin (ZITHROMAX) 250 MG tablet; Take 2 tablet for now and then one tablet for 4 days. -     methylPREDNISolone sodium succinate (SOLU-MEDROL) 125 mg/2 mL injection 125 mg  COPD exacerbation (HCC) -     albuterol (PROVENTIL HFA;VENTOLIN HFA) 108 (90 Base) MCG/ACT inhaler; Inhale 1-2 puffs into the lungs every 4 (four) hours as needed for wheezing or shortness of breath. -     azithromycin (ZITHROMAX) 250 MG tablet; Take 2 tablet for now and then one tablet for 4 days. -     methylPREDNISolone sodium succinate (SOLU-MEDROL) 125 mg/2 mL injection 125 mg  Centrilobular emphysema (HCC) -     Fluticasone-Umeclidin-Vilant (TRELEGY ELLIPTA) 100-62.5-25 MCG/INH AEPB; Inhale 1 puff into the lungs daily.       Plan:     She is certainly in acute respiratory failure. I do not see any signs of infection. Hold zpak until signs of infection occur. Pt was given IM 125 solumedrol today. starte oral prednisone tomorrow. Continue nebulizer/inhaler q 2-4 hours for the next few days until resting pulse ox is better. Use O2 more during exacerbation. Goal to keep O2 above 88 percent. Call back with oxygen company you use. I want them to come out and look at your overnight oxygen as well as add portable O2 for travel. She has not been taking symbicort. Stop sprivia. Start trelegy. We did not have coupon today for free month. If not able to get at pharmacy let me know and will get drug rep to come and give [place] some. Written out of work for next 3 days. Follow up in 1 week.   [name].Spent 30 minutes with patient and greater than 50 percent of visit spent counseling  patient regarding treatment plan.  Marland KitchenVernetta Honey PA-C, have reviewed and agree with the above documentation in it's entirety.

## 2018-06-05 NOTE — Patient Instructions (Signed)
Stop spiriva.   Start trelegy. proair or nebulizer every 2-4 hours. Prednisone to start.  Keep O2 above 88.

## 2018-06-07 ENCOUNTER — Ambulatory Visit: Payer: Medicare Other | Admitting: Physician Assistant

## 2018-06-21 ENCOUNTER — Ambulatory Visit (INDEPENDENT_AMBULATORY_CARE_PROVIDER_SITE_OTHER): Payer: Medicare Other

## 2018-06-21 ENCOUNTER — Encounter: Payer: Self-pay | Admitting: Physician Assistant

## 2018-06-21 ENCOUNTER — Ambulatory Visit (INDEPENDENT_AMBULATORY_CARE_PROVIDER_SITE_OTHER): Payer: Medicare Other | Admitting: Physician Assistant

## 2018-06-21 VITALS — BP 154/68 | HR 74 | Wt 123.0 lb

## 2018-06-21 DIAGNOSIS — J439 Emphysema, unspecified: Secondary | ICD-10-CM

## 2018-06-21 DIAGNOSIS — K2100 Gastro-esophageal reflux disease with esophagitis, without bleeding: Secondary | ICD-10-CM

## 2018-06-21 DIAGNOSIS — K429 Umbilical hernia without obstruction or gangrene: Secondary | ICD-10-CM | POA: Insufficient documentation

## 2018-06-21 DIAGNOSIS — R140 Abdominal distension (gaseous): Secondary | ICD-10-CM | POA: Diagnosis not present

## 2018-06-21 DIAGNOSIS — J9601 Acute respiratory failure with hypoxia: Secondary | ICD-10-CM | POA: Diagnosis not present

## 2018-06-21 DIAGNOSIS — I701 Atherosclerosis of renal artery: Secondary | ICD-10-CM | POA: Diagnosis not present

## 2018-06-21 DIAGNOSIS — J441 Chronic obstructive pulmonary disease with (acute) exacerbation: Secondary | ICD-10-CM

## 2018-06-21 DIAGNOSIS — R05 Cough: Secondary | ICD-10-CM | POA: Diagnosis not present

## 2018-06-21 DIAGNOSIS — J9611 Chronic respiratory failure with hypoxia: Secondary | ICD-10-CM

## 2018-06-21 LAB — CBC WITH DIFFERENTIAL/PLATELET
BASOS PCT: 0.3 %
Basophils Absolute: 68 cells/uL (ref 0–200)
EOS PCT: 1 %
Eosinophils Absolute: 226 cells/uL (ref 15–500)
HCT: 43.4 % (ref 35.0–45.0)
Hemoglobin: 14.2 g/dL (ref 11.7–15.5)
Lymphs Abs: 2825 cells/uL (ref 850–3900)
MCH: 31 pg (ref 27.0–33.0)
MCHC: 32.7 g/dL (ref 32.0–36.0)
MCV: 94.8 fL (ref 80.0–100.0)
MONOS PCT: 4.6 %
MPV: 12.2 fL (ref 7.5–12.5)
NEUTROS PCT: 81.6 %
Neutro Abs: 18442 cells/uL — ABNORMAL HIGH (ref 1500–7800)
Platelets: 218 10*3/uL (ref 140–400)
RBC: 4.58 10*6/uL (ref 3.80–5.10)
RDW: 12.6 % (ref 11.0–15.0)
TOTAL LYMPHOCYTE: 12.5 %
WBC: 22.6 10*3/uL — AB (ref 3.8–10.8)
WBCMIX: 1040 {cells}/uL — AB (ref 200–950)

## 2018-06-21 LAB — COMPLETE METABOLIC PANEL WITH GFR
AG Ratio: 1.4 (calc) (ref 1.0–2.5)
ALT: 38 U/L — ABNORMAL HIGH (ref 6–29)
AST: 28 U/L (ref 10–35)
Albumin: 3.6 g/dL (ref 3.6–5.1)
Alkaline phosphatase (APISO): 54 U/L (ref 33–130)
BUN / CREAT RATIO: 18 (calc) (ref 6–22)
BUN: 27 mg/dL — AB (ref 7–25)
CO2: 23 mmol/L (ref 20–32)
CREATININE: 1.5 mg/dL — AB (ref 0.60–0.93)
Calcium: 8.7 mg/dL (ref 8.6–10.4)
Chloride: 108 mmol/L (ref 98–110)
GFR, Est African American: 40 mL/min/{1.73_m2} — ABNORMAL LOW (ref >=60)
GFR, Est Non African American: 34 mL/min/{1.73_m2} — ABNORMAL LOW (ref >=60)
GLUCOSE: 110 mg/dL — AB (ref 65–99)
Globulin: 2.5 g/dL (calc) (ref 1.9–3.7)
Potassium: 4.4 mmol/L (ref 3.5–5.3)
Sodium: 142 mmol/L (ref 135–146)
TOTAL PROTEIN: 6.1 g/dL (ref 6.1–8.1)
Total Bilirubin: 0.3 mg/dL (ref 0.2–1.2)

## 2018-06-21 MED ORDER — ALBUTEROL SULFATE HFA 108 (90 BASE) MCG/ACT IN AERS: 1.0000 | INHALATION_SPRAY | RESPIRATORY_TRACT | 3 refills | Status: DC | PRN

## 2018-06-21 MED ORDER — PREDNISONE 20 MG PO TABS
ORAL_TABLET | ORAL | 0 refills | Status: DC
Start: 2018-06-21 — End: 2018-07-15

## 2018-06-21 MED ORDER — FAMOTIDINE 20 MG PO TABS: 20.0000 mg | ORAL_TABLET | Freq: Every day | ORAL | 5 refills | Status: DC

## 2018-06-21 NOTE — Progress Notes (Signed)
HPI: FU PVD. 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 ABIs March 2018 moderate on the left and normal on the right.  She was admitted to [facility] with chest pain 03/15/18.  Echo 6/19 showed normal LV function and mild diastolic dysfunction. Nuclear study 8/19 showed EF 51 with no ischemia or infarction.  Abdominal ultrasound October 2019 showed no aneurysm.  Since last seen, she does have dyspnea on exertion which she attributes to COPD.  She was recently placed on steroids and has had mild lower extremity edema.  She denies chest pain, orthopnea or syncope.  No significant claudication.  Current Outpatient Medications  Medication Sig Dispense Refill  . acetaminophen (TYLENOL) 500 MG tablet Take 1,000 mg by mouth every 6 (six) hours as needed (pain).    [name] albuterol (PROVENTIL HFA;VENTOLIN HFA) 108 (90 Base) MCG/ACT inhaler Inhale 1-2 puffs into the lungs every 4 (four) hours as needed for wheezing or shortness of breath. 1 Inhaler 3  . albuterol (PROVENTIL) (2.5 MG/3ML) 0.083% nebulizer solution     . AMBULATORY NON FORMULARY MEDICATION Hypoxia with Room Air at 83 percent. Needs portable oxygen tank 2L O2 continuous and as needed for O2 stats below 90. 1 Device 0  . amLODipine (NORVASC) 10 MG tablet Take 1 tablet (10 mg total) by mouth daily. 90 tablet 3  . aspirin EC 81 MG tablet Take 1 tablet (81 mg total) by mouth daily. 90 tablet 3  . famotidine (PEPCID) 20 MG tablet Take 1 tablet (20 mg total) by mouth at bedtime. 30 tablet 5  . Fluticasone-Umeclidin-Vilant (TRELEGY ELLIPTA) 100-62.5-25 MCG/INH AEPB Inhale 1 puff into the lungs daily. 28 each 5  . ipratropium-albuterol (DUONEB) 0.5-2.5 (3) MG/3ML SOLN Take 3 mLs by nebulization every 4 (four) hours as needed. 360 mL 3  . labetalol (NORMODYNE) 100 MG tablet Take 100 mg by mouth 2 (two) times daily.     [name] LIVALO 4 MG TABS TAKE 1 TABLET EVERY OTHER DAY 90 tablet 2  . LYRICA 100 MG  capsule TAKE 1 CAPSULE BY MOUTH TWICE A DAY 180 capsule 1  . nystatin (MYCOSTATIN) 100000 UNIT/ML suspension TAKE 4 MLS (400,000 UNITS TOTAL) BY MOUTH 4 (FOUR) TIMES DAILY. 60 mL 0  . pantoprazole (PROTONIX) 20 MG tablet Take 1 tablet (20 mg total) by mouth daily. 90 tablet 1  . predniSONE (DELTASONE) 20 MG tablet Take 4 tablets for 4 days, take 3 tablets for 4 days, take 2 tablet for 4 days, take 1/2 tablet for 8 days. 40 tablet 0   No current facility-administered medications for this visit.      Past Medical History:  Diagnosis Date  . Anxiety 06/24/2012  . COPD (chronic obstructive pulmonary disease) ([place])   . Diverticulosis   . Emphysema 06/24/2012  . Emphysema of lung ([place])   . Essential hypertension 06/24/2012  . History of Left leg claudication 06/24/2012  . Hypertension   . Peripheral vascular disease ([place])   . Renal disorder    Renal Insufficiency R=30%  . Shortness of breath dyspnea     Past Surgical History:  Procedure Laterality Date  . ABDOMINAL AORTAGRAM  12/09/12  . ABDOMINAL AORTAGRAM N/A 12/09/2012   Procedure: ABDOMINAL Maxcine Ham;  Surgeon: Angelia Mould, MD;  Location[facility] CATH LAB;  Service: Cardiovascular;  Laterality: N/A;    Social History   Socioeconomic History  . Marital status: Married    Spouse  name: Not on file  . Number of children: Not on file  . Years of education: Not on file  . Highest education level: Not on file  Occupational History  . Not on file  Social Needs  . Financial resource strain: Not on file  . Food insecurity:    Worry: Not on file    Inability: Not on file  . Transportation needs:    Medical: Not on file    Non-medical: Not on file  Tobacco Use  . Smoking status: Former Smoker    Packs/day: 1.50    Years: 30.00    Pack years: 45.00    Types: Cigarettes    Last attempt to quit: 12/30/2009    Years since quitting: 8.4  . Smokeless tobacco: Never Used  Substance and Sexual Activity  . Alcohol use: No  . Drug  use: No  . Sexual activity: Not on file  Lifestyle  . Physical activity:    Days per week: Not on file    Minutes per session: Not on file  . Stress: Not on file  Relationships  . Social connections:    Talks on phone: Not on file    Gets together: Not on file    Attends religious service: Not on file    Active member of club or organization: Not on file    Attends meetings of clubs or organizations: Not on file    Relationship status: Not on file  . Intimate partner violence:    Fear of current or ex partner: Not on file    Emotionally abused: Not on file    Physically abused: Not on file    Forced sexual activity: Not on file  Other Topics Concern  . Not on file  Social History Narrative  . Not on file    Family History  Problem Relation Age of Onset  . Cancer Mother   . Heart attack Father   . Deep vein thrombosis Father   . Hyperlipidemia Father   . Hypertension Father     ROS: no fevers or chills, productive cough, hemoptysis, dysphasia, odynophagia, melena, hematochezia, dysuria, hematuria, rash, seizure activity, orthopnea, PND, pedal edema, claudication. Remaining systems are negative.  Physical Exam: Well-developed well-nourished in no acute distress.  Skin is warm and dry.  HEENT is normal.  Neck is supple.  Chest diminished BS throughout Cardiovascular exam is regular rate and rhythm.  Abdominal exam nontender or distended. No masses palpated. Extremities show no edema. neuro grossly intact   A/P  1 chest pain-no recurrent symptoms.  Recent nuclear study negative.  Will not pursue further evaluation.  2 peripheral vascular disease-plan to continue aspirin and statin.  No significant symptoms at this point.  3 renal artery stenosis-continue medical therapy.  Repeat Dopplers  4 hypertension-blood pressure is elevated.  Increase labetalol to 200 mg twice daily and follow.  5 hyperlipidemia-not at goal.  Discontinue Livalo.  Begin Crestor 20 mg daily.   Check lipids and liver in 4 to 6 weeks.  Kirk Ruths, MD

## 2018-06-21 NOTE — Patient Instructions (Addendum)
Need pulmonology appt.

## 2018-06-21 NOTE — Progress Notes (Signed)
Call pt: lots of COPD changes and fibrotic changes. No pneumonia. Treatment plan stays the same.

## 2018-06-21 NOTE — Progress Notes (Signed)
Subjective:    Patient ID: Catherine Phillips, female    DOB: 19-Apr-1946, 72 y.o.   MRN: [id number]  HPI  Pt is a 72 yo old femaleemale with HTN, RAS, chronic hypoxia, COPD, GERD who presents to the clinic to follow up on breathing.   Pt has pulmonologist and saw him in august and given 6 month follow up. For the last month struggled to breathe. She was given steriod and did get better but symptoms did not resolve. She has never took zpak due to not having any symptoms that appeared like bacterial causes.  She continues to work but her O2 levels will go into 70's and she feels terrible. No fever, chills, or body aches. She is on trelegy daily and has notice some improvement. She is having a lot of abdominal bloating. Her abdomen is distended and notice a bulge in her belly button area. Some loose stools but no hematochezia or melena. She has felt like her reflux is getting a little worse.   .. Active Ambulatory Problems    Diagnosis Date Noted  . History of Left leg claudication 06/24/2012  . Essential hypertension 06/24/2012  . Anxiety 06/24/2012  . Hyperlipidemia LDL goal <70 06/26/2012  . Osteoporosis 09/04/2012  . GERD (gastroesophageal reflux disease) 10/04/2012  . Pain in limb 12/04/2012  . Atherosclerosis of native artery of extremity with intermittent claudication ([place]) 12/04/2012  . Peripheral vascular disease ([place]) 01/22/2013  . COPD (chronic obstructive pulmonary disease) with emphysema Gold B 08/22/2013  . Peripheral neuropathy 10/28/2013  . Pulmonary nodules 04/28/2014  . Gastritis 12/03/2014  . Osteoarthritis, hand 04/19/2015  . COPD (chronic obstructive pulmonary disease) ([place]) 05/02/2015  . Leukocytosis 05/02/2015  . SOB (shortness of breath) 05/02/2015  . Dehydration 05/02/2015  . AKI (acute kidney injury) ([place]) 05/02/2015  . Epigastric abdominal pain 04/25/2016  . Hyperkalemia 05/22/2016  . Medication reaction 05/31/2016  . Renal artery stenosis ([place]) 06/21/2016  . Acute  respiratory failure with hypoxia ([place]) 12/23/2016  . COPD exacerbation ([place]) 12/23/2016  . Nocturnal hypoxia 03/01/2018  . Chest pain with moderate risk of acute coronary syndrome 03/16/2018  . CKD (chronic kidney disease), stage III ([place]) 03/16/2018  . Precordial chest pain 05/26/2018  . Muscle cramping 05/26/2018  . Emphysema of lung ([place]) 06/05/2018  . Umbilical hernia without obstruction and without gangrene 06/21/2018  . Chronic respiratory failure with hypoxia ([place]) 06/21/2018  . Abdominal distention 06/21/2018   Resolved Ambulatory Problems    Diagnosis Date Noted  . COPD exacerbation ([place]) 08/09/2014  . CAP (community acquired pneumonia) 08/09/2014  . COPD exacerbation ([place]) 05/02/2015   Past Medical History:  Diagnosis Date  . Diverticulosis   . Emphysema 06/24/2012  . Hypertension   . Renal disorder   . Shortness of breath dyspnea         Review of Systems See HPI.     Objective:   Physical Exam  Constitutional: She appears well-developed and well-nourished.  HENT:  Head: Normocephalic and atraumatic.  Cardiovascular: Normal rate.  Pulmonary/Chest: She has no wheezes. She has no rhonchi.  Decreased air flow.   Abdominal: She exhibits distension. Bowel sounds are decreased. There is generalized tenderness. A hernia is present.  umbilical hernia reducible without pain.   Neurological: She is alert.  Psychiatric: She has a normal mood and affect.          Assessment & Plan:  Marland KitchenMarland KitchenGulianna was seen today for abdominal pain and shortness of breath.  Diagnoses and all orders for  this visit:  Chronic respiratory failure with hypoxia (HCC) -     DG Chest 2 View  COPD exacerbation (HCC) -     albuterol (PROVENTIL HFA;VENTOLIN HFA) 108 (90 Base) MCG/ACT inhaler; Inhale 1-2 puffs into the lungs every 4 (four) hours as needed for wheezing or shortness of breath. -     CBC with Differential/Platelet -     COMPLETE METABOLIC PANEL WITH GFR -     DG Chest 2  View  Acute respiratory failure with hypoxia (HCC) -     albuterol (PROVENTIL HFA;VENTOLIN HFA) 108 (90 Base) MCG/ACT inhaler; Inhale 1-2 puffs into the lungs every 4 (four) hours as needed for wheezing or shortness of breath. -     predniSONE (DELTASONE) 20 MG tablet; Take 4 tablets for 4 days, take 3 tablets for 4 days, take 2 tablet for 4 days, take 1/2 tablet for 8 days.  Abdominal distention -     CBC with Differential/Platelet -     COMPLETE METABOLIC PANEL WITH GFR -     US Abdomen Complete  Umbilical hernia without obstruction and without gangrene -     CBC with Differential/Platelet -     COMPLETE METABOLIC PANEL WITH GFR -     US Abdomen Complete  Gastroesophageal reflux disease with esophagitis -     famotidine (PEPCID) 20 MG tablet; Take 1 tablet (20 mg total) by mouth at bedtime.   Pt is in chronic respiratory failure. Needs STAT appt with pulmonologist. Continue trelegy.  Added longer taper of prednisone. She can only keep O2 levels up if on oxygen. Discussed need to stay on oxygen daily all the time until she starts to back to baseline. Will call company and order portable tank. Written out of work for a few weeks. CXR STAT was negative for any pneumonia.   Added pepcid due to zantac recall.   Concerned about the bloating and abdominal distention. This could be what caused umbilical hernia. Will get u/s.

## 2018-06-24 MED ORDER — AMBULATORY NON FORMULARY MEDICATION: 0 refills | Status: AC

## 2018-06-24 NOTE — Progress Notes (Signed)
Call pt: WBC elevated but patient has been on steroids. Kidneys stable. Liver enzyme just a little elevated but up significantly from last time we checked.   How is patient feeling today?   Did not see u/s of abdomen. Did she have this done?

## 2018-06-24 NOTE — Addendum Note (Signed)
Addended by: Donella Stade on: 06/24/2018 03:37 PM   Modules accepted: Orders

## 2018-06-24 NOTE — Progress Notes (Signed)
Yes I will print order but we need to call o2 company and make sure they get order.

## 2018-06-25 NOTE — Progress Notes (Signed)
Ok to visit if she takes oxygen with her.

## 2018-06-26 ENCOUNTER — Ambulatory Visit (INDEPENDENT_AMBULATORY_CARE_PROVIDER_SITE_OTHER): Payer: Medicare Other

## 2018-06-26 DIAGNOSIS — N261 Atrophy of kidney (terminal): Secondary | ICD-10-CM

## 2018-06-26 DIAGNOSIS — K429 Umbilical hernia without obstruction or gangrene: Secondary | ICD-10-CM | POA: Diagnosis not present

## 2018-06-26 NOTE — Progress Notes (Signed)
Call pt: great news ultrasound with no acute findings. Bloating could be from gas. Watch diet making it worse.

## 2018-06-27 ENCOUNTER — Telehealth: Payer: Self-pay

## 2018-06-27 NOTE — Telephone Encounter (Signed)
At room air at rest she was stating 80 and 85 percent I pretty sure that qualifies for O2. You can look up improvement of 2L of O2 into the 90's. Will she still need walk test?

## 2018-06-27 NOTE — Telephone Encounter (Signed)
[name] from [place] called this morning saying that they received all necessary information for Catherine Phillips's portable oxygen, but they were wanting to see if she has had a 6 minute walk test within the last 30 days. I do not see this in her chart, and was wanting to see if you want her to schedule a nurse visit to have done? Or if you want her to see you herself? Thanks!

## 2018-06-28 ENCOUNTER — Ambulatory Visit (INDEPENDENT_AMBULATORY_CARE_PROVIDER_SITE_OTHER): Payer: Medicare Other | Admitting: Cardiology

## 2018-06-28 ENCOUNTER — Encounter: Payer: Self-pay | Admitting: Cardiology

## 2018-06-28 VITALS — BP 140/60 | HR 60 | Ht 62.0 in | Wt 125.4 lb

## 2018-06-28 DIAGNOSIS — I10 Essential (primary) hypertension: Secondary | ICD-10-CM | POA: Diagnosis not present

## 2018-06-28 DIAGNOSIS — I701 Atherosclerosis of renal artery: Secondary | ICD-10-CM | POA: Diagnosis not present

## 2018-06-28 DIAGNOSIS — I739 Peripheral vascular disease, unspecified: Secondary | ICD-10-CM | POA: Diagnosis not present

## 2018-06-28 DIAGNOSIS — E785 Hyperlipidemia, unspecified: Secondary | ICD-10-CM | POA: Diagnosis not present

## 2018-06-28 MED ORDER — LABETALOL HCL 200 MG PO TABS: 200.0000 mg | ORAL_TABLET | Freq: Two times a day (BID) | ORAL | 3 refills | Status: AC

## 2018-06-28 MED ORDER — ROSUVASTATIN CALCIUM 20 MG PO TABS: 20.0000 mg | ORAL_TABLET | Freq: Every day | ORAL | 3 refills | Status: DC

## 2018-06-28 NOTE — Patient Instructions (Signed)
Medication Instructions:  INCREASE LABETALOL TO 200 MG TWICE DAILY= 2 OF THE 100 MG TABLETS TWICE DAILY  STOP LIVALO   START ROSUVASTATIN 20 MG ONCE DAILY If you need a refill on your cardiac medications before your next appointment, please call your pharmacy.   Lab work: Your physician recommends that you return for lab work in: [place] If you have labs (blood work) drawn today and your tests are completely normal, you will receive your results only by: [name] MyChart Message (if you have MyChart) OR . A paper copy in the mail If you have any lab test that is abnormal or we need to change your treatment, we will call you to review the results.  Testing/Procedures: Your physician has requested that you have a renal artery duplex. During this test, an ultrasound is used to evaluate blood flow to the kidneys. Allow one hour for this exam. Do not eat after midnight the day before and avoid carbonated beverages. Take your medications as you usually do.    Follow-Up: At [facility], you and your health needs are our priority.  As part of our continuing mission to provide you with exceptional heart care, we have created designated Provider Care Teams.  These Care Teams include your primary Cardiologist (physician) and Advanced Practice Providers (APPs -  Physician Assistants and Nurse Practitioners) who all work together to provide you with the care you need, when you need it. You will need a follow up appointment in 6 months.  Please call our office 2 months in advance to schedule this appointment.  You may see Kirk Ruths, MD or one of the following Advanced Practice Providers on your designated Care Team:   Kerin Ransom, PA-C Roby Lofts, [place] . Sande Rives, PA-C

## 2018-07-02 NOTE — Telephone Encounter (Signed)
A walk test will still be needed. Patient on nurse schedule for tomorrow for 6 minute walk test.

## 2018-07-03 ENCOUNTER — Ambulatory Visit (INDEPENDENT_AMBULATORY_CARE_PROVIDER_SITE_OTHER): Payer: Medicare Other | Admitting: Physician Assistant

## 2018-07-03 VITALS — BP 140/60 | HR 65 | Ht 62.0 in | Wt 125.4 lb

## 2018-07-03 DIAGNOSIS — J9611 Chronic respiratory failure with hypoxia: Secondary | ICD-10-CM

## 2018-07-03 DIAGNOSIS — I701 Atherosclerosis of renal artery: Secondary | ICD-10-CM

## 2018-07-03 NOTE — Progress Notes (Signed)
Patient presents to clinic today for a walk test for her COPD. Patient states she currently has oxygen at home, which she uses everyday. She is on 2 liters of oxygen at home, and uses it during the day, along with sleeping. Patient monitors her oxygen at home as well. At rest, with no oxygen, patient's oxygen is 91%. During the walk test, patient's oxygen dropped down to 72%. I then put the patient on 2 liters of oxygen, and her resting O2 went up to 95%. We then proceeded with the other part of the walk test with the patient being on oxygen. As the patient walked with oxygen her oxygen percentage was steady at 90%.  Walk test documentation will be sent to [facility] for patient to be set up with a portable oxygen unit. Casilda Carls, CMA.

## 2018-07-04 ENCOUNTER — Ambulatory Visit (HOSPITAL_COMMUNITY)
Admission: RE | Admit: 2018-07-04 | Discharge: 2018-07-04 | Disposition: A | Discharge status: Home / Self Care | Payer: Medicare Other | Source: Ambulatory Visit | Attending: Cardiology | Admitting: Cardiology

## 2018-07-04 ENCOUNTER — Encounter: Payer: Self-pay | Admitting: Physician Assistant

## 2018-07-04 DIAGNOSIS — I701 Atherosclerosis of renal artery: Secondary | ICD-10-CM | POA: Diagnosis not present

## 2018-07-11 DIAGNOSIS — N183 Chronic kidney disease, stage 3 (moderate): Secondary | ICD-10-CM | POA: Diagnosis not present

## 2018-07-11 DIAGNOSIS — I10 Essential (primary) hypertension: Secondary | ICD-10-CM | POA: Diagnosis not present

## 2018-07-11 DIAGNOSIS — I701 Atherosclerosis of renal artery: Secondary | ICD-10-CM | POA: Diagnosis not present

## 2018-07-15 ENCOUNTER — Ambulatory Visit (INDEPENDENT_AMBULATORY_CARE_PROVIDER_SITE_OTHER): Payer: Medicare Other | Admitting: Primary Care

## 2018-07-15 ENCOUNTER — Ambulatory Visit (INDEPENDENT_AMBULATORY_CARE_PROVIDER_SITE_OTHER): Payer: Medicare Other | Admitting: Pulmonary Disease

## 2018-07-15 ENCOUNTER — Ambulatory Visit (INDEPENDENT_AMBULATORY_CARE_PROVIDER_SITE_OTHER): Payer: Medicare Other | Admitting: Physician Assistant

## 2018-07-15 ENCOUNTER — Other Ambulatory Visit: Payer: Self-pay | Admitting: Primary Care

## 2018-07-15 ENCOUNTER — Other Ambulatory Visit (INDEPENDENT_AMBULATORY_CARE_PROVIDER_SITE_OTHER): Payer: Medicare Other

## 2018-07-15 ENCOUNTER — Encounter: Payer: Self-pay | Admitting: Physician Assistant

## 2018-07-15 ENCOUNTER — Encounter: Payer: Self-pay | Admitting: Primary Care

## 2018-07-15 ENCOUNTER — Ambulatory Visit: Payer: Medicare Other | Admitting: Primary Care

## 2018-07-15 VITALS — BP 146/68 | HR 69 | Temp 98.1°F | Ht 62.0 in | Wt 125.8 lb

## 2018-07-15 VITALS — BP 108/47 | HR 71 | Ht 62.0 in | Wt 125.4 lb

## 2018-07-15 DIAGNOSIS — J9611 Chronic respiratory failure with hypoxia: Secondary | ICD-10-CM

## 2018-07-15 DIAGNOSIS — I701 Atherosclerosis of renal artery: Secondary | ICD-10-CM

## 2018-07-15 DIAGNOSIS — J439 Emphysema, unspecified: Secondary | ICD-10-CM

## 2018-07-15 DIAGNOSIS — R0602 Shortness of breath: Secondary | ICD-10-CM

## 2018-07-15 DIAGNOSIS — J441 Chronic obstructive pulmonary disease with (acute) exacerbation: Secondary | ICD-10-CM

## 2018-07-15 DIAGNOSIS — J9601 Acute respiratory failure with hypoxia: Secondary | ICD-10-CM | POA: Diagnosis not present

## 2018-07-15 DIAGNOSIS — J449 Chronic obstructive pulmonary disease, unspecified: Secondary | ICD-10-CM

## 2018-07-15 DIAGNOSIS — I5033 Acute on chronic diastolic (congestive) heart failure: Secondary | ICD-10-CM | POA: Diagnosis not present

## 2018-07-15 LAB — PULMONARY FUNCTION TEST
DL/VA % pred: 34 %
DL/VA: 1.59 ml/min/mmHg/L
DLCO UNC % PRED: 19 %
DLCO unc: 4.25 ml/min/mmHg
FEF 25-75 PRE: 0.33 L/s
FEF2575-%Pred-Pre: 19 %
FEV1-%PRED-PRE: 40 %
FEV1-PRE: 0.82 L
FEV1FVC-%Pred-Pre: 65 %
FEV6-%PRED-PRE: 62 %
FEV6-Pre: 1.61 L
FEV6FVC-%PRED-PRE: 101 %
FVC-%PRED-PRE: 61 %
FVC-PRE: 1.66 L
PRE FEV1/FVC RATIO: 49 %
Pre FEV6/FVC Ratio: 97 %

## 2018-07-15 LAB — BASIC METABOLIC PANEL
BUN: 27 mg/dL — AB (ref 6–23)
CALCIUM: 8.5 mg/dL (ref 8.4–10.5)
CO2: 23 mEq/L (ref 19–32)
Chloride: 105 mEq/L (ref 96–112)
Creatinine, Ser: 1.7 mg/dL — ABNORMAL HIGH (ref 0.40–1.20)
GFR: 31.39 mL/min — AB (ref >60.00)
GLUCOSE: 118 mg/dL — AB (ref 70–99)
Potassium: 4.8 mEq/L (ref 3.5–5.1)
SODIUM: 139 meq/L (ref 135–145)

## 2018-07-15 LAB — CBC
HCT: 39.9 % (ref 36.0–46.0)
HEMOGLOBIN: 13.1 g/dL (ref 12.0–15.0)
MCHC: 32.7 g/dL (ref 30.0–36.0)
MCV: 93.4 fl (ref 78.0–100.0)
PLATELETS: 253 10*3/uL (ref 150.0–400.0)
RBC: 4.28 Mil/uL (ref 3.87–5.11)
RDW: 14.3 % (ref 11.5–15.5)
WBC: 14 10*3/uL — ABNORMAL HIGH (ref 4.0–10.5)

## 2018-07-15 LAB — BRAIN NATRIURETIC PEPTIDE: Pro B Natriuretic peptide (BNP): 195 pg/mL — ABNORMAL HIGH (ref 0.0–100.0)

## 2018-07-15 MED ORDER — PREDNISONE 20 MG PO TABS: ORAL_TABLET | ORAL | 0 refills | Status: DC

## 2018-07-15 NOTE — Progress Notes (Signed)
Subjective:    Patient ID: Catherine Phillips, female    DOB: 01/30/1946, 72 y.o.   MRN: [id number]  HPI Patient is a 72 year old female with COPD, chronic respiratory failure with hypoxia, acute respiratory failure, renal artery stenosis, hypertension, peripheral vascular disease who presents to the clinic not feeling well and very short of breath.  She was recently approved for portable home oxygen when she has her oxygen at around 2 L she is satting around 90%.  She certainly feels much better.  About 4 weeks ago she was given a prednisone taper.  She did feel better after using the prednisone taper but has 30 phone worse over the past 2 to 3 days.  She does feel like she has some mucus production but not able to get it out.  She denies any fever, chills, body aches.  She complains of feeling very weak and dizzy.  At last visit she was written out of work for 4 weeks.  She is supposed to go back to work on November 4.  She is taking her trilogy daily as well as using her rescue inhaler at least 2-3 times a day.  She does have a pulmonologist but she has not seen them in a few months.  She does not have a follow-up until the beginning of next year.  .. Active Ambulatory Problems    Diagnosis Date Noted  . History of Left leg claudication 06/24/2012  . Essential hypertension 06/24/2012  . Anxiety 06/24/2012  . Hyperlipidemia LDL goal <70 06/26/2012  . Osteoporosis 09/04/2012  . GERD (gastroesophageal reflux disease) 10/04/2012  . Pain in limb 12/04/2012  . Atherosclerosis of native artery of extremity with intermittent claudication ([place]) 12/04/2012  . Peripheral vascular disease ([place]) 01/22/2013  . COPD (chronic obstructive pulmonary disease) with emphysema Gold B 08/22/2013  . Peripheral neuropathy 10/28/2013  . Pulmonary nodules 04/28/2014  . Gastritis 12/03/2014  . Osteoarthritis, hand 04/19/2015  . COPD (chronic obstructive pulmonary disease) ([place]) 05/02/2015  . Leukocytosis 05/02/2015  .  SOB (shortness of breath) 05/02/2015  . Dehydration 05/02/2015  . AKI (acute kidney injury) ([place]) 05/02/2015  . Epigastric abdominal pain 04/25/2016  . Hyperkalemia 05/22/2016  . Medication reaction 05/31/2016  . Renal artery stenosis ([place]) 06/21/2016  . Acute respiratory failure with hypoxia ([place]) 12/23/2016  . COPD exacerbation ([place]) 12/23/2016  . Nocturnal hypoxia 03/01/2018  . Chest pain with moderate risk of acute coronary syndrome 03/16/2018  . CKD (chronic kidney disease), stage III ([place]) 03/16/2018  . Precordial chest pain 05/26/2018  . Muscle cramping 05/26/2018  . Emphysema of lung ([place]) 06/05/2018  . Umbilical hernia without obstruction and without gangrene 06/21/2018  . Chronic respiratory failure with hypoxia ([place]) 06/21/2018  . Abdominal distention 06/21/2018   Resolved Ambulatory Problems    Diagnosis Date Noted  . COPD exacerbation ([place]) 08/09/2014  . CAP (community acquired pneumonia) 08/09/2014  . COPD exacerbation ([place]) 05/02/2015   Past Medical History:  Diagnosis Date  . Diverticulosis   . Emphysema 06/24/2012  . Hypertension   . Renal disorder   . Shortness of breath dyspnea       Review of Systems See HPI.     Objective:   Physical Exam  Constitutional: She is oriented to person, place, and time.  Labored breathing without O2.   HENT:  Head: Normocephalic.  Eyes: Conjunctivae are normal.  Cardiovascular: Normal rate and regular rhythm.  Pulmonary/Chest:  Labored breathing.  Decreased lung sounds.  No wheezing or rhonchi.  Cough  sounds more productive.   Neurological: She is alert and oriented to person, place, and time.  Skin: She is not diaphoretic.  Psychiatric: She has a normal mood and affect. Her behavior is normal.          Assessment & Plan:  Marland KitchenMarland KitchenDiagnoses and all orders for this visit:  COPD exacerbation ([place]) -     predniSONE (DELTASONE) 20 MG tablet; Take 4 tablets for 4 days, take 3 tablets for 4 days, take 2 tablet for 4  days, take 1/2 tablet for 8 days.  Acute respiratory failure with hypoxia (HCC) -     predniSONE (DELTASONE) 20 MG tablet; Take 4 tablets for 4 days, take 3 tablets for 4 days, take 2 tablet for 4 days, take 1/2 tablet for 8 days.  Chronic respiratory failure with hypoxia (HCC) -     predniSONE (DELTASONE) 20 MG tablet; Take 4 tablets for 4 days, take 3 tablets for 4 days, take 2 tablet for 4 days, take 1/2 tablet for 8 days.  Patient left her portable oxygen at home and she is starting at 78% here in office.  Lung exam has significant decreased lung sounds.  I would like to start a prednisone taper and get her in with pulmonology as soon as possible.  I do think there needs to be some changes to her maintenance therapy.  She does seem more productive than she has over the past few visits.  She has a Z-Pak at home that was given as just in case from last visit she may start this.  Strongly encourage patient to stay on oxygen and not forget her portable tank.  She needs to be satting above 88%.  Most of her dizziness and feeling weak is likely coming from hypoxia.  She is certainly not ready to go back to work.  I am writing her out for another 8 weeks so that we can figure out what is going on with her worsening lung  .  We were able to call pulmonology and get her an appointment today at 215.  Follow up in 2 month or sooner if needed.   [name].Spent 30 minutes with patient and greater than 50 percent of visit spent counseling patient regarding treatment plan.

## 2018-07-15 NOTE — Patient Instructions (Signed)
Start zpak and prednisone.

## 2018-07-15 NOTE — Patient Instructions (Addendum)
  Recommendations: Continues Trelegy 1 puff daily Complete prednisone taper and zpack as ordered by PCP Use duonebs every 4 hours as needed for shortness of breath/wheezing  Oxygen requirements:  Use 2L oxygen at home Make sure to use portable oxygen when out of house at all times  Titrate oxygen 2-4L as needed to keep level >90%   Referral:  Pulmonary rehab re: COPD, severe emphysema    Orders: Spirometry with DLCO today   Follow-up: 4-6 weeks with Dr. Elsworth Soho or NP

## 2018-07-15 NOTE — Progress Notes (Signed)
k

## 2018-07-15 NOTE — Progress Notes (Signed)
@Patient  ID: Catherine Phillips, female    DOB: 08-22-1946, 72 y.o.   MRN: [id number]  Chief Complaint  Patient presents with  . Acute Visit    O2 drops in exertion    Referring provider: Lavada Mesi  HPI: 72 year old female, former smoker quit 2011 (45 pack year). PMH significant for COPD, emphysema, chronic respiratory failure with hypoxia, HTN and GERD. Patient of Dr. Elsworth Soho, last seen by pulmonary NP on July 2018. Started on Trelegy 1 month ago by primary care.   07/15/2018 Patient presents for acute visit for low O2 level. Seen at primary care office today, oxygen noted to be 78% on room air. 2L placed, oxygen level 95%. She forgot her portable oxygen canister at home. Started on prendisone taper and advised to take [place] she has on hand at home. 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 Note provided by PA to be out of work for another 8 weeks.   Oxygen level during todays apt 76% room air, she did not have oxygen on when she arrived. 02 91% 4L. Normally wears 2 liters oxygen at home. States that she wears it most of the time but occasionally takes it off if feeling ok. New to POC, received 2 weeks go. States she left it in the car today because she didn't think she needed it. Also the battery died. She has not noticed a difference in her breathing with Trelegy, howevere, her husband things it has helped some. Not using neb treatments. Reports very little cough, not productive. No wheezing. CXR 06/21/18 showed emphysema. No consolidation    Testing Spirometry in 2016  FEV 2.4 (90%), FEV1 1.2 (60%), ratio 52   Spirometry with DLCO 07/15/2018 - showed severe obstruction  FVC 1.66 (61%), FEV1 0.82 (40%), ratio 49, DLCOunc 4.25 (19%)   Images: CT chest 07/2014 - changes of ILD , stable. Mod emphysema, nodules decreased compared to 04/2014 except new one RUL CT chest 12/21/15 - Two 10 x 8 mm spiculated left lower lobe nodules, new from 2016 CT Abdomen/pelvis. Additional 7 x 5 mm  left upper lobe nodule, new from 2015 CT chest. CT chest 03/2016 - Waxing and waning pulmonary nodules most consistent with a benign inflammatory or infectious process.2. Centrilobular emphysema in the upper limits. Stable pleural-parenchymal nodular thickening at the lung apices. CT chest 04/18/2018 - showed no acute pulmonary disease. Continued stability of small pulmonary nodules, considered benign. Severe centrilobular emphysema with diffuse bronchial wall thickening, compatible with the provided history of COPD. Chronic paratracheal adenopathy is stable since 2015, most compatible with benign etiology.  Allergies  Allergen Reactions  . Codeine Nausea And Vomiting  . Levaquin [Levofloxacin In D5w] Itching and Rash  . Sulfamethoxazole-Trimethoprim Other (See Comments)    Hyperkalemia (a high level of the electrolyte potassium in the blood)    Immunization History  Administered Date(s) Administered  . Influenza Split 06/24/2012  . Influenza, High Dose Seasonal PF 07/13/2017, 05/24/2018  . Influenza,inj,Quad PF,6+ Mos 06/26/2013, 06/11/2014, 05/31/2015, 06/28/2016  . Pneumococcal Conjugate-13 10/29/2014  . Pneumococcal Polysaccharide-23 05/29/2012  . Tdap 06/12/2010    Past Medical History:  Diagnosis Date  . Anxiety 06/24/2012  . COPD (chronic obstructive pulmonary disease) ([place])   . Diverticulosis   . Emphysema 06/24/2012  . Emphysema of lung ([place])   . Essential hypertension 06/24/2012  . History of Left leg claudication 06/24/2012  . Hypertension   . Peripheral vascular disease ([place])   . Renal disorder    Renal  Insufficiency R=30%  . Shortness of breath dyspnea     Tobacco History: Social History   Tobacco Use  Smoking Status Former Smoker  . Packs/day: 1.50  . Years: 30.00  . Pack years: 45.00  . Types: Cigarettes  . Last attempt to quit: 12/30/2009  . Years since quitting: 8.5  Smokeless Tobacco Never Used   Counseling given: Not Answered   Outpatient Medications  Prior to Visit  Medication Sig Dispense Refill  . acetaminophen (TYLENOL) 500 MG tablet Take 1,000 mg by mouth every 6 (six) hours as needed (pain).    [name] albuterol (PROVENTIL HFA;VENTOLIN HFA) 108 (90 Base) MCG/ACT inhaler Inhale 1-2 puffs into the lungs every 4 (four) hours as needed for wheezing or shortness of breath. 1 Inhaler 3  . albuterol (PROVENTIL) (2.5 MG/3ML) 0.083% nebulizer solution     . AMBULATORY NON FORMULARY MEDICATION Hypoxia with Room Air at 83 percent. Needs portable oxygen tank 2L O2 continuous and as needed for O2 stats below 90. 1 Device 0  . amLODipine (NORVASC) 10 MG tablet Take 1 tablet (10 mg total) by mouth daily. 90 tablet 3  . aspirin EC 81 MG tablet Take 1 tablet (81 mg total) by mouth daily. 90 tablet 3  . famotidine (PEPCID) 20 MG tablet Take 1 tablet (20 mg total) by mouth at bedtime. 30 tablet 5  . Fluticasone-Umeclidin-Vilant (TRELEGY ELLIPTA) 100-62.5-25 MCG/INH AEPB Inhale 1 puff into the lungs daily. 28 each 5  . labetalol (NORMODYNE) 200 MG tablet Take 1 tablet (200 mg total) by mouth 2 (two) times daily. 180 tablet 3  . LYRICA 100 MG capsule TAKE 1 CAPSULE BY MOUTH TWICE A DAY 180 capsule 1  . nystatin (MYCOSTATIN) 100000 UNIT/ML suspension TAKE 4 MLS (400,000 UNITS TOTAL) BY MOUTH 4 (FOUR) TIMES DAILY. 60 mL 0  . pantoprazole (PROTONIX) 20 MG tablet Take 1 tablet (20 mg total) by mouth daily. 90 tablet 1  . predniSONE (DELTASONE) 20 MG tablet Take 4 tablets for 4 days, take 3 tablets for 4 days, take 2 tablet for 4 days, take 1/2 tablet for 8 days. 40 tablet 0  . rosuvastatin (CRESTOR) 20 MG tablet Take 1 tablet (20 mg total) by mouth daily. 90 tablet 3  . ipratropium-albuterol (DUONEB) 0.5-2.5 (3) MG/3ML SOLN Take 3 mLs by nebulization every 4 (four) hours as needed. 360 mL 3   No facility-administered medications prior to visit.     Review of Systems  Review of Systems  Constitutional: Negative.   HENT: Negative.   Respiratory: Positive for cough  and shortness of breath. Negative for wheezing.   Cardiovascular: Negative.     Physical Exam  BP (!) 146/68 (BP Location: Left Arm, Cuff Size: Normal)   Pulse 69   Temp 98.1 F (36.7 C)   Ht 5\' 2"  (1.575 m)   Wt 125 lb 12.8 oz (57.1 kg)   SpO2 91%   BMI 23.01 kg/m  Physical Exam  Constitutional: She is oriented to person, place, and time. She appears well-developed and well-nourished. No distress.  HENT:  Head: Normocephalic and atraumatic.  Eyes: Pupils are equal, round, and reactive to light. EOM are normal.  Neck: Normal range of motion. Neck supple.  Cardiovascular: Normal rate and regular rhythm.  Pulmonary/Chest: Effort normal. She has no wheezes. She has rales.  O2 sat 76% RA, 91% 4L  Rales bases   Neurological: She is alert and oriented to person, place, and time.  Skin: Skin is warm and dry.  Psychiatric: She has a normal mood and affect. Her behavior is normal. Judgment and thought content normal.     Lab Results:  CBC    Component Value Date/Time   WBC 14.0 (H) 07/15/2018 1629   RBC 4.28 07/15/2018 1629   HGB 13.1 07/15/2018 1629   HCT 39.9 07/15/2018 1629   PLT 253.0 07/15/2018 1629   MCV 93.4 07/15/2018 1629   MCH 31.0 06/21/2018 1106   MCHC 32.7 07/15/2018 1629   RDW 14.3 07/15/2018 1629   LYMPHSABS 2,825 06/21/2018 1106   MONOABS 648 12/05/2016 1046   EOSABS 226 06/21/2018 1106   BASOSABS 68 06/21/2018 1106    BMET    Component Value Date/Time   NA 139 07/15/2018 1629   K 4.8 07/15/2018 1629   CL 105 07/15/2018 1629   CO2 23 07/15/2018 1629   GLUCOSE 118 (H) 07/15/2018 1629   BUN 27 (H) 07/15/2018 1629   CREATININE 1.70 (H) 07/15/2018 1629   CREATININE 1.50 (H) 06/21/2018 1106   CALCIUM 8.5 07/15/2018 1629   GFRNONAA 34 (L) 06/21/2018 1106   GFRAA 40 (L) 06/21/2018 1106    BNP    Component Value Date/Time   BNP 194.3 (H) 05/02/2015 0920    ProBNP    Component Value Date/Time   PROBNP 195.0 (H) 07/15/2018 1629     Imaging: Dg Chest 2 View  Result Date: 06/21/2018 CLINICAL DATA:  Cough.  Emphysema. EXAM: CHEST - 2 VIEW COMPARISON:  Chest radiograph March 15, 2018 and chest CT April 18, 2018 FINDINGS: There are areas of fibrosis throughout the lungs bilaterally. Changes of underlying emphysema are noted diffusely, better seen on recent CT. There is no frank edema or consolidation. The heart size is normal. Pulmonary vascularity is stable, reflecting the underlying emphysematous change. No adenopathy. There is aortic atherosclerosis. No bone lesions. IMPRESSION: Extensive underlying emphysematous change with fibrosis throughout the lungs bilaterally. No edema or consolidation. Stable cardiac silhouette. There is aortic atherosclerosis. Aortic Atherosclerosis (ICD10-I70.0) and Emphysema (ICD10-J43.9). Electronically Signed   By: Lowella Grip III M.D.   On: 06/21/2018 13:42   US Abdomen Complete  Result Date: 06/26/2018 CLINICAL DATA:  Abdominal distention.  New umbilical hernia EXAM: ABDOMEN ULTRASOUND COMPLETE COMPARISON:  None. FINDINGS: Gallbladder: No gallstones or wall thickening visualized. No sonographic Murphy sign noted by sonographer. Common bile duct: Diameter: 3 mm Liver: No focal lesion identified. Within normal limits in parenchymal echogenicity. Portal vein is patent on color Doppler imaging with normal direction of blood flow towards the liver. IVC: No abnormality visualized. Pancreas: Visualized portion unremarkable. Spleen: Size and appearance within normal limits. Right Kidney: Length: 8 cm. The asymmetric atrophy is smooth. No hydronephrosis. Left Kidney: Length: 9 cm. Echogenicity within normal limits. No mass or hydronephrosis visualized. Abdominal aorta: Limited visualization with no evident aneurysm. IMPRESSION: 1. No acute finding. 2. Right renal atrophy. Electronically Signed   By: Monte Fantasia M.D.   On: 06/26/2018 14:37     Assessment & Plan:   Emphysema of lung ([place]) -  Dyspnea attributed to severe emphysema - Spirometry showed severe diffusion capacity defect (DLCOunc 19%)  - Needs to wear oxygen at all times  - Refer to pulmonary rehab    COPD (chronic obstructive pulmonary disease) ([place]) - Spirometry with DLCO 07/15/2018 - showed severe obstruction  FVC 1.66 (61%), FEV1 0.82 (40%), ratio 49, DLCOunc 4.25 (19%)  - Continue Trelegy - Advised patient use Duoneb as needed every 4 hours   Chronic respiratory failure with hypoxia ([place]) -  Needs to wear oxygen at all times including on exertion when outside the house  - 2L continuous oxygen at home; needs 4L pulsed on exertion  - Titrate oxygen 2-4L as needed to keep level >90%   COPD exacerbation ([facility]) - Prescribed Zpack and prednisone taper   Diastolic congestive heart failure ([facility]) - Echocardiogram in June 2019 showed EF 37-62%, grade 1 diastolic dysfunction - BNP today 195 - RX lasix 20mg  x 3 days, repeat labs Friday  - Needs to follow up with PCP for management    Follow up in 4-6 weeks with Dr. Elsworth Soho or NP  Martyn Ehrich, NP 07/16/2018

## 2018-07-15 NOTE — Progress Notes (Signed)
Patient completed pre spiro and DLCO. 

## 2018-07-15 NOTE — Progress Notes (Deleted)
@Patient  ID: Catherine Phillips, female    DOB: 12/13/1945, 72 y.o.   MRN: [id number]  No chief complaint on file.   Referring provider: Lavada Mesi  HPI: 72 year old female, former smoker. PMH significant for COPD, emphysema, chronic respiratory failure with hypoxia, HTN and GERD. Patient of Dr. Elsworth Soho, last seen by pulmonary NP on July 2018. Maintained on Trelegy.   07/15/2018 Patient presents today for acute visit. Seen at primary care office today, oxygen noted to be 78% on room air. 2L placed, oxygen level 95%. She forgot her portable oxygen canister at home. Started on prendisone taper and advised to take [place] she has on hand at home. Note provided by PA to be out of work for another 8 weeks.    Testing Spirometry in 2016  FEV 2.4 (90%), FEV1 1.2 (60%), ratio 52  Images: CT chest 07/2014 - changes of ILD , stable. Mod emphysema, nodules decreased compared to 04/2014 except new one RUL CT chest 12/21/15 - Two 10 x 8 mm spiculated left lower lobe nodules, new from 2016 CT abdomen/pelvis.Additional 7 x 5 mm left upper lobe nodule, new from 2015 CT chest. CT chest 03/2016 - Waxing and waning pulmonary nodules most consistent with a benign inflammatory or infectious process.2. Centrilobular emphysema in the upper limits. Stable pleural-parenchymal nodular thickening at the lung apices. CT chest 04/18/2018 - showed no acute pulmonary disease. Continued stability of small pulmonary nodules, considered benign. Severe centrilobular emphysema with diffuse bronchial wall thickening, compatible with the provided history of COPD. Chronic paratracheal adenopathy is stable since 2015, most compatible with benign etiology.  Allergies  Allergen Reactions  . Codeine Nausea And Vomiting  . Levaquin [Levofloxacin In D5w] Itching and Rash  . Sulfamethoxazole-Trimethoprim Other (See Comments)    Hyperkalemia (a high level of the electrolyte potassium in the blood)    Immunization History    Administered Date(s) Administered  . Influenza Split 06/24/2012  . Influenza, High Dose Seasonal PF 07/13/2017, 05/24/2018  . Influenza,inj,Quad PF,6+ Mos 06/26/2013, 06/11/2014, 05/31/2015, 06/28/2016  . Pneumococcal Conjugate-13 10/29/2014  . Pneumococcal Polysaccharide-23 05/29/2012  . Tdap 06/12/2010    Past Medical History:  Diagnosis Date  . Anxiety 06/24/2012  . COPD (chronic obstructive pulmonary disease) ([place])   . Diverticulosis   . Emphysema 06/24/2012  . Emphysema of lung ([place])   . Essential hypertension 06/24/2012  . History of Left leg claudication 06/24/2012  . Hypertension   . Peripheral vascular disease ([place])   . Renal disorder    Renal Insufficiency R=30%  . Shortness of breath dyspnea     Tobacco History: Social History   Tobacco Use  Smoking Status Former Smoker  . Packs/day: 1.50  . Years: 30.00  . Pack years: 45.00  . Types: Cigarettes  . Last attempt to quit: 12/30/2009  . Years since quitting: 8.5  Smokeless Tobacco Never Used   Counseling given: Not Answered   Outpatient Medications Prior to Visit  Medication Sig Dispense Refill  . acetaminophen (TYLENOL) 500 MG tablet Take 1,000 mg by mouth every 6 (six) hours as needed (pain).    [name] albuterol (PROVENTIL HFA;VENTOLIN HFA) 108 (90 Base) MCG/ACT inhaler Inhale 1-2 puffs into the lungs every 4 (four) hours as needed for wheezing or shortness of breath. 1 Inhaler 3  . albuterol (PROVENTIL) (2.5 MG/3ML) 0.083% nebulizer solution     . AMBULATORY NON FORMULARY MEDICATION Hypoxia with Room Air at 83 percent. Needs portable oxygen tank 2L O2 continuous and as needed  for O2 stats below 90. 1 Device 0  . amLODipine (NORVASC) 10 MG tablet Take 1 tablet (10 mg total) by mouth daily. 90 tablet 3  . aspirin EC 81 MG tablet Take 1 tablet (81 mg total) by mouth daily. 90 tablet 3  . famotidine (PEPCID) 20 MG tablet Take 1 tablet (20 mg total) by mouth at bedtime. 30 tablet 5  . Fluticasone-Umeclidin-Vilant  (TRELEGY ELLIPTA) 100-62.5-25 MCG/INH AEPB Inhale 1 puff into the lungs daily. 28 each 5  . ipratropium-albuterol (DUONEB) 0.5-2.5 (3) MG/3ML SOLN Take 3 mLs by nebulization every 4 (four) hours as needed. 360 mL 3  . labetalol (NORMODYNE) 200 MG tablet Take 1 tablet (200 mg total) by mouth 2 (two) times daily. 180 tablet 3  . LYRICA 100 MG capsule TAKE 1 CAPSULE BY MOUTH TWICE A DAY 180 capsule 1  . nystatin (MYCOSTATIN) 100000 UNIT/ML suspension TAKE 4 MLS (400,000 UNITS TOTAL) BY MOUTH 4 (FOUR) TIMES DAILY. 60 mL 0  . pantoprazole (PROTONIX) 20 MG tablet Take 1 tablet (20 mg total) by mouth daily. 90 tablet 1  . predniSONE (DELTASONE) 20 MG tablet Take 4 tablets for 4 days, take 3 tablets for 4 days, take 2 tablet for 4 days, take 1/2 tablet for 8 days. 40 tablet 0  . rosuvastatin (CRESTOR) 20 MG tablet Take 1 tablet (20 mg total) by mouth daily. 90 tablet 3   No facility-administered medications prior to visit.       Review of Systems  Review of Systems   Physical Exam  There were no vitals taken for this visit. Physical Exam   Lab Results:  CBC    Component Value Date/Time   WBC 22.6 (H) 06/21/2018 1106   RBC 4.58 06/21/2018 1106   HGB 14.2 06/21/2018 1106   HCT 43.4 06/21/2018 1106   PLT 218 06/21/2018 1106   MCV 94.8 06/21/2018 1106   MCH 31.0 06/21/2018 1106   MCHC 32.7 06/21/2018 1106   RDW 12.6 06/21/2018 1106   LYMPHSABS 2,825 06/21/2018 1106   MONOABS 648 12/05/2016 1046   EOSABS 226 06/21/2018 1106   BASOSABS 68 06/21/2018 1106    BMET    Component Value Date/Time   NA 142 06/21/2018 1106   K 4.4 06/21/2018 1106   CL 108 06/21/2018 1106   CO2 23 06/21/2018 1106   GLUCOSE 110 (H) 06/21/2018 1106   BUN 27 (H) 06/21/2018 1106   CREATININE 1.50 (H) 06/21/2018 1106   CALCIUM 8.7 06/21/2018 1106   GFRNONAA 34 (L) 06/21/2018 1106   GFRAA 40 (L) 06/21/2018 1106    BNP    Component Value Date/Time   BNP 194.3 (H) 05/02/2015 0920    ProBNP      Component Value Date/Time   PROBNP 431.0 (H) 08/09/2014 0925    Imaging: Dg Chest 2 View  Result Date: 06/21/2018 CLINICAL DATA:  Cough.  Emphysema. EXAM: CHEST - 2 VIEW COMPARISON:  Chest radiograph March 15, 2018 and chest CT April 18, 2018 FINDINGS: There are areas of fibrosis throughout the lungs bilaterally. Changes of underlying emphysema are noted diffusely, better seen on recent CT. There is no frank edema or consolidation. The heart size is normal. Pulmonary vascularity is stable, reflecting the underlying emphysematous change. No adenopathy. There is aortic atherosclerosis. No bone lesions. IMPRESSION: Extensive underlying emphysematous change with fibrosis throughout the lungs bilaterally. No edema or consolidation. Stable cardiac silhouette. There is aortic atherosclerosis. Aortic Atherosclerosis (ICD10-I70.0) and Emphysema (ICD10-J43.9). Electronically Signed   By: Gwyndolyn Saxon  Jasmine December III M.D.   On: 06/21/2018 13:42   US Abdomen Complete  Result Date: 06/26/2018 CLINICAL DATA:  Abdominal distention.  New umbilical hernia EXAM: ABDOMEN ULTRASOUND COMPLETE COMPARISON:  None. FINDINGS: Gallbladder: No gallstones or wall thickening visualized. No sonographic Murphy sign noted by sonographer. Common bile duct: Diameter: 3 mm Liver: No focal lesion identified. Within normal limits in parenchymal echogenicity. Portal vein is patent on color Doppler imaging with normal direction of blood flow towards the liver. IVC: No abnormality visualized. Pancreas: Visualized portion unremarkable. Spleen: Size and appearance within normal limits. Right Kidney: Length: 8 cm. The asymmetric atrophy is smooth. No hydronephrosis. Left Kidney: Length: 9 cm. Echogenicity within normal limits. No mass or hydronephrosis visualized. Abdominal aorta: Limited visualization with no evident aneurysm. IMPRESSION: 1. No acute finding. 2. Right renal atrophy. Electronically Signed   By: Monte Fantasia M.D.   On: 06/26/2018 14:37      Assessment & Plan:   No problem-specific Assessment & Plan notes found for this encounter.     Martyn Ehrich, NP 07/15/2018

## 2018-07-16 ENCOUNTER — Ambulatory Visit (HOSPITAL_BASED_OUTPATIENT_CLINIC_OR_DEPARTMENT_OTHER)
Admission: RE | Admit: 2018-07-16 | Discharge: 2018-07-16 | Disposition: A | Discharge status: Home / Self Care | Payer: Medicare Other | Source: Ambulatory Visit | Attending: Primary Care | Admitting: Primary Care

## 2018-07-16 ENCOUNTER — Other Ambulatory Visit: Payer: Self-pay

## 2018-07-16 ENCOUNTER — Telehealth: Payer: Self-pay | Admitting: Primary Care

## 2018-07-16 ENCOUNTER — Encounter: Payer: Self-pay | Admitting: Primary Care

## 2018-07-16 DIAGNOSIS — R0602 Shortness of breath: Secondary | ICD-10-CM

## 2018-07-16 DIAGNOSIS — J441 Chronic obstructive pulmonary disease with (acute) exacerbation: Secondary | ICD-10-CM

## 2018-07-16 DIAGNOSIS — I2510 Atherosclerotic heart disease of native coronary artery without angina pectoris: Secondary | ICD-10-CM | POA: Insufficient documentation

## 2018-07-16 DIAGNOSIS — R591 Generalized enlarged lymph nodes: Secondary | ICD-10-CM | POA: Diagnosis not present

## 2018-07-16 DIAGNOSIS — I5030 Unspecified diastolic (congestive) heart failure: Secondary | ICD-10-CM | POA: Insufficient documentation

## 2018-07-16 DIAGNOSIS — J449 Chronic obstructive pulmonary disease, unspecified: Secondary | ICD-10-CM

## 2018-07-16 LAB — D-DIMER, QUANTITATIVE (NOT AT ARMC): D DIMER QUANT: 1.03 ug{FEU}/mL — AB (ref <0.50)

## 2018-07-16 MED ORDER — FUROSEMIDE 20 MG PO TABS: 20.0000 mg | ORAL_TABLET | Freq: Every day | ORAL | 0 refills | Status: DC

## 2018-07-16 MED ORDER — IOPAMIDOL (ISOVUE-370) INJECTION 76%
100.0000 mL | Freq: Once | INTRAVENOUS | Status: AC | PRN
  Administered 2018-07-16: 53 mL via INTRAVENOUS

## 2018-07-16 NOTE — Assessment & Plan Note (Signed)
-   Echocardiogram in June 2019 showed EF 04-47%, grade 1 diastolic dysfunction - BNP today 195 - RX lasix 20mg  x 3 days, repeat labs Friday  - Needs to follow up with PCP for management

## 2018-07-16 NOTE — Progress Notes (Signed)
ct 

## 2018-07-16 NOTE — Telephone Encounter (Signed)
Please call patient let her know BNP elevated, recommend lasix 20mg  x 3 days. Recheck labs on Friday please and she needs to follow-up with PCP for management of HF.   Also, needs CTA Re: PE protocol. Labs were done.

## 2018-07-16 NOTE — Assessment & Plan Note (Signed)
-   Prescribed Zpack and prednisone taper

## 2018-07-16 NOTE — Telephone Encounter (Signed)
Spoke with pt and relayed info about BNP and lasix at pharmacy.  Told her we needed a CTA chest and they would call her to set it up.  Instructed her to start lasix after CTA.  She has a f/u with cardiology on 07/19/18 already.  Nothing further needed at this time.

## 2018-07-16 NOTE — Assessment & Plan Note (Addendum)
-   Dyspnea attributed to severe emphysema - Spirometry showed severe diffusion capacity defect (DLCOunc 19%)  - Needs to wear oxygen at all times  - Refer to pulmonary rehab

## 2018-07-16 NOTE — Assessment & Plan Note (Addendum)
-   Spirometry with DLCO 07/15/2018 - showed severe obstruction  FVC 1.66 (61%), FEV1 0.82 (40%), ratio 49, DLCOunc 4.25 (19%)  - Continue Trelegy - Advised patient use Duoneb as needed every 4 hours

## 2018-07-16 NOTE — Assessment & Plan Note (Addendum)
-   Needs to wear oxygen at all times including on exertion when outside the house  - 2L continuous oxygen at home; needs 4L pulsed on exertion  - Titrate oxygen 2-4L as needed to keep level >90%

## 2018-07-18 ENCOUNTER — Telehealth: Payer: Self-pay

## 2018-07-18 ENCOUNTER — Other Ambulatory Visit: Payer: Self-pay

## 2018-07-18 NOTE — Telephone Encounter (Signed)
Catherine Phillips called today saying that she spoke with a representative from [place], and they told her that she is able to receive the battery pack portable oxygen concentrator since her medicare and insurance would cover it. She was wanting to see if we could write that for her, and I will send it over to [place]. Thanks!

## 2018-07-18 NOTE — Telephone Encounter (Signed)
Ok to write it out and can sign tomorrow.

## 2018-07-19 ENCOUNTER — Encounter: Payer: Self-pay | Admitting: Cardiovascular Disease

## 2018-07-19 ENCOUNTER — Other Ambulatory Visit: Payer: Self-pay

## 2018-07-19 ENCOUNTER — Ambulatory Visit (INDEPENDENT_AMBULATORY_CARE_PROVIDER_SITE_OTHER): Payer: Medicare Other | Admitting: Cardiovascular Disease

## 2018-07-19 ENCOUNTER — Other Ambulatory Visit (INDEPENDENT_AMBULATORY_CARE_PROVIDER_SITE_OTHER): Payer: Medicare Other

## 2018-07-19 VITALS — BP 140/72 | HR 75 | Ht 62.0 in | Wt 127.4 lb

## 2018-07-19 DIAGNOSIS — I701 Atherosclerosis of renal artery: Secondary | ICD-10-CM | POA: Diagnosis not present

## 2018-07-19 DIAGNOSIS — R0602 Shortness of breath: Secondary | ICD-10-CM | POA: Diagnosis not present

## 2018-07-19 DIAGNOSIS — J449 Chronic obstructive pulmonary disease, unspecified: Secondary | ICD-10-CM | POA: Diagnosis not present

## 2018-07-19 LAB — BASIC METABOLIC PANEL
BUN: 34 mg/dL — AB (ref 6–23)
CO2: 23 meq/L (ref 19–32)
Calcium: 7.5 mg/dL — ABNORMAL LOW (ref 8.4–10.5)
Chloride: 107 mEq/L (ref 96–112)
Creatinine, Ser: 1.51 mg/dL — ABNORMAL HIGH (ref 0.40–1.20)
GFR: 35.99 mL/min — ABNORMAL LOW (ref >60.00)
GLUCOSE: 175 mg/dL — AB (ref 70–99)
POTASSIUM: 4.6 meq/L (ref 3.5–5.1)
SODIUM: 142 meq/L (ref 135–145)

## 2018-07-19 LAB — BRAIN NATRIURETIC PEPTIDE: Pro B Natriuretic peptide (BNP): 574 pg/mL — ABNORMAL HIGH (ref 0.0–100.0)

## 2018-07-19 MED ORDER — AMBULATORY NON FORMULARY MEDICATION: 0 refills | Status: AC

## 2018-07-19 NOTE — Assessment & Plan Note (Signed)
Catherine Phillips was referred to me by Dr. Stanford Breed for evaluation of renal artery stenosis.  She apparently has had known right renal artery stenosis in the past.  She does have essential hypertension with blood pressures controlled on amlodipine and labetalol.  Recent renal Dopplers performed 07/05/2018 revealed a small probably nonfunctioning right kidney with a left kidney that measured approximately 10 cm and significant bilateral renal artery stenosis.  She does see a nephrologist.  Her serum creatinine is 1.5.  At this point, I do not think there is indication to proceed with renal artery stenting on the left.  We will continue to follow her renal Doppler study.  Should her left kidney begin to diminish in size and/or her blood pressure become more labile, or her nephrologist feel that it is in her best interest for preservation of renal function to proceed with left renal artery stenting I be happy to do this.  Otherwise, we will continue to follow her renal Doppler studies annually and I will see her back after that for further evaluation.

## 2018-07-19 NOTE — Progress Notes (Signed)
07/19/2018 Catherine Phillips   08-Aug-1946  [id number]  Primary Physician Donella Stade, PA-C Primary Cardiologist: Lorretta Harp MD [name], [place]  HPI:  Catherine Phillips is a 72 y.o. moderately overweight married Caucasian female mother of 76, grandmother for grandchildren is accompanied by her husband checked today.  She was referred by Dr. Stanford Breed for peripheral vascular evaluation because of renal artery stenosis.  She does have a history of 100 pack years of tobacco abuse having quit 8 years ago as well history of hypertension hyperlipidemia.  Father died of a microinfarction age 1.  She is never had a heart attack or stroke.  She does have COPD and chronic shortness of breath but denies chest pain.  She also has known PAD and claudication in the past week which no longer is a clinical issue.  She is had known right renal artery stenosis by duplex ultrasound in the past as well with recent Dopplers suggest a small nonfunctioning right kidney as well as bilateral renal artery stenosis with a left kidney measuring approximately 10 cm.  Her blood pressure is controlled on 2 antihypertensive medications.  She does see a nephrologist and has creatinines that run in the mid 1 range.   Current Meds  Medication Sig  . acetaminophen (TYLENOL) 500 MG tablet Take 1,000 mg by mouth every 6 (six) hours as needed (pain).  [name] albuterol (PROVENTIL HFA;VENTOLIN HFA) 108 (90 Base) MCG/ACT inhaler Inhale 1-2 puffs into the lungs every 4 (four) hours as needed for wheezing or shortness of breath.  [name] albuterol (PROVENTIL) (2.5 MG/3ML) 0.083% nebulizer solution   . AMBULATORY NON FORMULARY MEDICATION Hypoxia with Room Air at 83 percent. Needs portable oxygen tank 2L O2 continuous and as needed for O2 stats below 90.  [name] amLODipine (NORVASC) 10 MG tablet Take 1 tablet (10 mg total) by mouth daily.  [name] aspirin EC 81 MG tablet Take 1 tablet (81 mg total) by mouth daily.  . famotidine (PEPCID) 20 MG tablet  Take 1 tablet (20 mg total) by mouth at bedtime.  . Fluticasone-Umeclidin-Vilant (TRELEGY ELLIPTA) 100-62.5-25 MCG/INH AEPB Inhale 1 puff into the lungs daily.  . furosemide (LASIX) 20 MG tablet Take 1 tablet (20 mg total) by mouth daily for 3 days.  [name] labetalol (NORMODYNE) 200 MG tablet Take 1 tablet (200 mg total) by mouth 2 (two) times daily.  [name] LYRICA 100 MG capsule TAKE 1 CAPSULE BY MOUTH TWICE A DAY  . nystatin (MYCOSTATIN) 100000 UNIT/ML suspension TAKE 4 MLS (400,000 UNITS TOTAL) BY MOUTH 4 (FOUR) TIMES DAILY.  . pantoprazole (PROTONIX) 20 MG tablet Take 1 tablet (20 mg total) by mouth daily.  . predniSONE (DELTASONE) 20 MG tablet Take 4 tablets for 4 days, take 3 tablets for 4 days, take 2 tablet for 4 days, take 1/2 tablet for 8 days.  . rosuvastatin (CRESTOR) 20 MG tablet Take 1 tablet (20 mg total) by mouth daily.     Allergies  Allergen Reactions  . Codeine Nausea And Vomiting  . Levaquin [Levofloxacin In D5w] Itching and Rash  . Sulfamethoxazole-Trimethoprim Other (See Comments)    Hyperkalemia (a high level of the electrolyte potassium in the blood)    Social History   Socioeconomic History  . Marital status: Married    Spouse name: Not on file  . Number of children: Not on file  . Years of education: Not on file  . Highest education level: Not on file  Occupational History  . Not  on file  Social Needs  . Financial resource strain: Not on file  . Food insecurity:    Worry: Not on file    Inability: Not on file  . Transportation needs:    Medical: Not on file    Non-medical: Not on file  Tobacco Use  . Smoking status: Former Smoker    Packs/day: 1.50    Years: 30.00    Pack years: 45.00    Types: Cigarettes    Last attempt to quit: 12/30/2009    Years since quitting: 8.5  . Smokeless tobacco: Never Used  Substance and Sexual Activity  . Alcohol use: No  . Drug use: No  . Sexual activity: Not on file  Lifestyle  . Physical activity:    Days per week:  Not on file    Minutes per session: Not on file  . Stress: Not on file  Relationships  . Social connections:    Talks on phone: Not on file    Gets together: Not on file    Attends religious service: Not on file    Active member of club or organization: Not on file    Attends meetings of clubs or organizations: Not on file    Relationship status: Not on file  . Intimate partner violence:    Fear of current or ex partner: Not on file    Emotionally abused: Not on file    Physically abused: Not on file    Forced sexual activity: Not on file  Other Topics Concern  . Not on file  Social History Narrative  . Not on file     Review of Systems: General: negative for chills, fever, night sweats or weight changes.  Cardiovascular: negative for chest pain, dyspnea on exertion, edema, orthopnea, palpitations, paroxysmal nocturnal dyspnea or shortness of breath Dermatological: negative for rash Respiratory: negative for cough or wheezing Urologic: negative for hematuria Abdominal: negative for nausea, vomiting, diarrhea, bright red blood per rectum, melena, or hematemesis Neurologic: negative for visual changes, syncope, or dizziness All other systems reviewed and are otherwise negative except as noted above.    Blood pressure 140/72, pulse 75, height 5\' 2"  (1.575 m), weight 127 lb 6.4 oz (57.8 kg).  General appearance: alert and no distress Neck: no adenopathy, no carotid bruit, no JVD, supple, symmetrical, trachea midline and thyroid not enlarged, symmetric, no tenderness/mass/nodules Lungs: clear to auscultation bilaterally Heart: regular rate and rhythm, S1, S2 normal, no murmur, click, rub or gallop Extremities: extremities normal, atraumatic, no cyanosis or edema Pulses: 2+ and symmetric Skin: Skin color, texture, turgor normal. No rashes or lesions Neurologic: Alert and oriented X 3, normal strength and tone. Normal symmetric reflexes. Normal coordination and gait  EKG not  performed today  ASSESSMENT AND PLAN:   Renal artery stenosis ([place]) [name] was referred to me by Dr. Stanford Breed for evaluation of renal artery stenosis.  She apparently has had known right renal artery stenosis in the past.  She does have essential hypertension with blood pressures controlled on amlodipine and labetalol.  Recent renal Dopplers performed 07/05/2018 revealed a small probably nonfunctioning right kidney with a left kidney that measured approximately 10 cm and significant bilateral renal artery stenosis.  She does see a nephrologist.  Her serum creatinine is 1.5.  At this point, I do not think there is indication to proceed with renal artery stenting on the left.  We will continue to follow her renal Doppler study.  Should her left kidney begin to  diminish in size and/or her blood pressure become more labile, or her nephrologist feel that it is in her best interest for preservation of renal function to proceed with left renal artery stenting I be happy to do this.  Otherwise, we will continue to follow her renal Doppler studies annually and I will see her back after that for further evaluation.      Lorretta Harp MD FACP,FACC,FAHA, [facility] 07/19/2018 2:33 PM

## 2018-07-19 NOTE — Patient Instructions (Signed)
Medication Instructions:  Your physician recommends that you continue on your current medications as directed. Please refer to the Current Medication list given to you today.  If you need a refill on your cardiac medications before your next appointment, please call your pharmacy.   Lab work: none If you have labs (blood work) drawn today and your tests are completely normal, you will receive your results only by: [name] MyChart Message (if you have MyChart) OR . A paper copy in the mail If you have any lab test that is abnormal or we need to change your treatment, we will call you to review the results.  Testing/Procedures: Your physician has requested that you have a renal artery duplex. During this test, an ultrasound is used to evaluate blood flow to the kidneys. Allow one hour for this exam. Do not eat after midnight the day before and avoid carbonated beverages. Take your medications as you usually do.  SCHEDULE FOR October 2020  Follow-Up: At [facility], you and your health needs are our priority.  As part of our continuing mission to provide you with exceptional heart care, we have created designated Provider Care Teams.  These Care Teams include your primary Cardiologist (physician) and Advanced Practice Providers (APPs -  Physician Assistants and Nurse Practitioners) who all work together to provide you with the care you need, when you need it. You will need a follow up appointment in 12 months with Dr. Gwenlyn Found - Vascular.  Please call our office 2 months in advance to schedule this appointment.  You may see Kirk Ruths, MD or one of the following Advanced Practice Providers on your designated Care Team:   Kerin Ransom, PA-C Roby Lofts, [place] . Sande Rives, PA-C  Any Other Special Instructions Will Be Listed Below (If Applicable).

## 2018-07-22 ENCOUNTER — Telehealth (HOSPITAL_COMMUNITY): Payer: Self-pay

## 2018-07-22 NOTE — Telephone Encounter (Signed)
Called and spoke with pt in regards to PR - pt stated she is not interested at this time as she has a lot going on. Pt stated she might be at the beginning of the year and will give her pulmonologist a car if and when she is ready to place new referral. Verbalized understanding to pt and closed referral.

## 2018-07-22 NOTE — Telephone Encounter (Signed)
Patient called back requesting CT results; pt contact # [phone]

## 2018-07-22 NOTE — Telephone Encounter (Signed)
Pt is requesting CTA results.  This was ordered by [facility] but she is not in office to advise of results.  RA please advise.  Thanks!

## 2018-07-22 NOTE — Telephone Encounter (Signed)
Spoke with pt, aware of results/recs.  Nothing further needed.  

## 2018-07-22 NOTE — Telephone Encounter (Signed)
No evidence of blood clot in the lungs. Scan suggestive of edema fluid in the lung, Lasix given earlier should help

## 2018-07-22 NOTE — Telephone Encounter (Signed)
Pt's insurance is active and benefits verified through Medicare A/B.  No Co-pay, deductible amount of $185.00, out of pocket amount $0.00/$0.00, 20% co-insurance, and no pre-authorization is required.  Pt's insurance is active through [employer] - No co-pay, no deductible, no out of pocket, no co-insurance, and no pre-authorization is required.  Pt is covered at 100%.

## 2018-07-30 DIAGNOSIS — E785 Hyperlipidemia, unspecified: Secondary | ICD-10-CM | POA: Diagnosis not present

## 2018-07-30 LAB — LIPID PANEL
CHOL/HDL RATIO: 2.8 ratio (ref 0.0–4.4)
Cholesterol, Total: 186 mg/dL (ref 100–199)
HDL: 66 mg/dL (ref >39)
LDL CALC: 83 mg/dL (ref 0–99)
Triglycerides: 183 mg/dL — ABNORMAL HIGH (ref 0–149)
VLDL CHOLESTEROL CAL: 37 mg/dL (ref 5–40)

## 2018-07-30 LAB — HEPATIC FUNCTION PANEL
ALT: 16 IU/L (ref 0–32)
AST: 15 IU/L (ref 0–40)
Albumin: 3.8 g/dL (ref 3.5–4.8)
Alkaline Phosphatase: 42 IU/L (ref 39–117)
Bilirubin Total: 0.3 mg/dL (ref 0.0–1.2)
Bilirubin, Direct: 0.07 mg/dL (ref 0.00–0.40)
TOTAL PROTEIN: 5.8 g/dL — AB (ref 6.0–8.5)

## 2018-08-06 ENCOUNTER — Telehealth: Payer: Self-pay | Admitting: *Deleted

## 2018-08-06 DIAGNOSIS — E785 Hyperlipidemia, unspecified: Secondary | ICD-10-CM

## 2018-08-06 MED ORDER — ROSUVASTATIN CALCIUM 40 MG PO TABS: 40.0000 mg | ORAL_TABLET | Freq: Every day | ORAL | 3 refills | Status: DC

## 2018-08-06 NOTE — Telephone Encounter (Signed)
-----   Message from Lelon Perla, MD sent at 07/30/2018  4:38 PM EST ----- Change crestor to 40 mg daily; lipids and liver 6 weeks Kirk Ruths

## 2018-08-06 NOTE — Telephone Encounter (Signed)
Results released to my chart  New script sent to the pharmacy and Lab orders mailed to the pt.

## 2018-08-07 ENCOUNTER — Encounter: Payer: Self-pay | Admitting: Physician Assistant

## 2018-08-07 ENCOUNTER — Ambulatory Visit: Payer: Medicare Other | Admitting: Physician Assistant

## 2018-08-07 ENCOUNTER — Ambulatory Visit (INDEPENDENT_AMBULATORY_CARE_PROVIDER_SITE_OTHER): Payer: Medicare Other | Admitting: Physician Assistant

## 2018-08-07 VITALS — BP 132/58 | HR 74 | Ht 62.0 in | Wt 127.0 lb

## 2018-08-07 DIAGNOSIS — I739 Peripheral vascular disease, unspecified: Secondary | ICD-10-CM | POA: Diagnosis not present

## 2018-08-07 DIAGNOSIS — J9611 Chronic respiratory failure with hypoxia: Secondary | ICD-10-CM

## 2018-08-07 DIAGNOSIS — I10 Essential (primary) hypertension: Secondary | ICD-10-CM

## 2018-08-07 DIAGNOSIS — J432 Centrilobular emphysema: Secondary | ICD-10-CM

## 2018-08-07 DIAGNOSIS — I701 Atherosclerosis of renal artery: Secondary | ICD-10-CM | POA: Diagnosis not present

## 2018-08-07 NOTE — Progress Notes (Signed)
Subjective:    Patient ID: Catherine Phillips, female    DOB: Feb 04, 1946, 72 y.o.   MRN: [id number]  HPI Pt is a 72 yo female with severe COPD and chronic respiratory failure with hypoxia. She has had a rough 2 months. She has not been able to get her O2 stats up and had to supplement with O2 more than usual. She was written out of work while she was recovering from exacerbation. She is feeling a lot better and would like to go back to work. She is not using O2 with ambulation and holding her pulse ox at 88 percent. She quote "is tired of doing nothing and ready to get back to work. I love to work". No significant SOB or cough. No fever, chills, body aches. She does have a little ST that started yesterday.  Continues on Trelegy and albuterol once or twice a day.   .. Active Ambulatory Problems    Diagnosis Date Noted  . History of Left leg claudication 06/24/2012  . Essential hypertension 06/24/2012  . Anxiety 06/24/2012  . Hyperlipidemia LDL goal <70 06/26/2012  . Osteoporosis 09/04/2012  . GERD (gastroesophageal reflux disease) 10/04/2012  . Pain in limb 12/04/2012  . Atherosclerosis of native artery of extremity with intermittent claudication ([place]) 12/04/2012  . Peripheral vascular disease ([place]) 01/22/2013  . COPD (chronic obstructive pulmonary disease) with emphysema Gold B 08/22/2013  . Peripheral neuropathy 10/28/2013  . Pulmonary nodules 04/28/2014  . Gastritis 12/03/2014  . Osteoarthritis, hand 04/19/2015  . COPD (chronic obstructive pulmonary disease) ([place]) 05/02/2015  . Leukocytosis 05/02/2015  . SOB (shortness of breath) 05/02/2015  . Dehydration 05/02/2015  . AKI (acute kidney injury) ([place]) 05/02/2015  . Epigastric abdominal pain 04/25/2016  . Hyperkalemia 05/22/2016  . Medication reaction 05/31/2016  . Renal artery stenosis ([place]) 06/21/2016  . Acute respiratory failure with hypoxia ([place]) 12/23/2016  . COPD exacerbation ([place]) 12/23/2016  . Nocturnal hypoxia 03/01/2018  .  Chest pain with moderate risk of acute coronary syndrome 03/16/2018  . CKD (chronic kidney disease), stage III ([place]) 03/16/2018  . Precordial chest pain 05/26/2018  . Muscle cramping 05/26/2018  . Emphysema of lung ([place]) 06/05/2018  . Umbilical hernia without obstruction and without gangrene 06/21/2018  . Chronic respiratory failure with hypoxia ([place]) 06/21/2018  . Abdominal distention 06/21/2018  . Diastolic congestive heart failure ([place]) 07/16/2018   Resolved Ambulatory Problems    Diagnosis Date Noted  . COPD exacerbation ([place]) 08/09/2014  . CAP (community acquired pneumonia) 08/09/2014  . COPD exacerbation ([place]) 05/02/2015   Past Medical History:  Diagnosis Date  . Diverticulosis   . Emphysema 06/24/2012  . Hypertension   . Renal disorder   . Shortness of breath dyspnea       Review of Systems See HPI.     Objective:   Physical Exam  Constitutional: She is oriented to person, place, and time. She appears well-developed and well-nourished.  HENT:  Head: Normocephalic and atraumatic.  Right Ear: External ear normal.  Left Ear: External ear normal.  Nose: Nose normal.  Mouth/Throat: Oropharynx is clear and moist. No oropharyngeal exudate.  Eyes: Conjunctivae are normal. Right eye exhibits no discharge. Left eye exhibits no discharge.  Neck: Normal range of motion.  Cardiovascular: Normal rate and regular rhythm.  Pulmonary/Chest: Effort normal and breath sounds normal. She has no wheezes.  Lymphadenopathy:    She has no cervical adenopathy.  Neurological: She is alert and oriented to person, place, and time.  Psychiatric: She has  a normal mood and affect. Her behavior is normal.          Assessment & Plan:  Marland KitchenMarland KitchenDiagnoses and all orders for this visit:  Chronic respiratory failure with hypoxia ([place])  Essential hypertension  Peripheral vascular disease ([place])  Centrilobular emphysema ([place])  I would like O2 stats to be 90 or above. She will take her albuterol  to work. Her previous baseline was 90. She cannot use O2 at work. Pt did not want any hour restrictions. I did write her back to work but not allowed to use ladder. Discussed good hand washing hygiene. ST likely due to O2 or viral. Symptomatic care discussed follow up as needed.

## 2018-08-08 ENCOUNTER — Encounter: Payer: Self-pay | Admitting: Physician Assistant

## 2018-08-13 ENCOUNTER — Telehealth: Payer: Self-pay | Admitting: Physician Assistant

## 2018-08-13 MED ORDER — FUROSEMIDE 20 MG PO TABS: ORAL_TABLET | ORAL | 0 refills | Status: DC

## 2018-08-13 NOTE — Telephone Encounter (Signed)
Lasix is on her medication list. Does she still have some of this? How is SOB?

## 2018-08-13 NOTE — Telephone Encounter (Signed)
sent 

## 2018-08-13 NOTE — Telephone Encounter (Signed)
Spoke with patient she does not have any lasik, would like a new rx sent to [facility]. Pt denies shortness of breath.

## 2018-08-13 NOTE — Telephone Encounter (Signed)
Pt left message on voicemail in regards to her ankles swelling. She is requesting a medication to help with the swelling . Will forward to provider for review.

## 2018-08-20 ENCOUNTER — Ambulatory Visit (INDEPENDENT_AMBULATORY_CARE_PROVIDER_SITE_OTHER): Payer: Medicare Other | Admitting: Physician Assistant

## 2018-08-20 ENCOUNTER — Encounter: Payer: Self-pay | Admitting: Physician Assistant

## 2018-08-20 VITALS — BP 169/136 | HR 72 | Ht 62.0 in | Wt 127.0 lb

## 2018-08-20 DIAGNOSIS — I701 Atherosclerosis of renal artery: Secondary | ICD-10-CM

## 2018-08-20 DIAGNOSIS — M545 Low back pain, unspecified: Secondary | ICD-10-CM

## 2018-08-20 DIAGNOSIS — M79604 Pain in right leg: Secondary | ICD-10-CM | POA: Diagnosis not present

## 2018-08-20 DIAGNOSIS — R252 Cramp and spasm: Secondary | ICD-10-CM

## 2018-08-20 DIAGNOSIS — M79605 Pain in left leg: Secondary | ICD-10-CM

## 2018-08-20 DIAGNOSIS — I739 Peripheral vascular disease, unspecified: Secondary | ICD-10-CM

## 2018-08-20 NOTE — Patient Instructions (Signed)
Decrease crestor to 20mg (1/2 tablet) 7-14 days.

## 2018-08-20 NOTE — Progress Notes (Signed)
Subjective:    Patient ID: Catherine Phillips, female    DOB: March 23, 1946, 72 y.o.   MRN: [id number]  HPI Patient is a 71 year old female with a history of PAD, COPD, HTN, renal artery stenosis, diastolic congestive heart failure, and CKD stage 3 presents today with complaints of bilateral leg pain, back pain and right ankle swelling.  She reports that symptoms started her first day back at work. She started feeling bilateral proximal thigh pain after 2 hours of standing at work, which became unbearable and radiated to her left low back after 4 hours. She reports that her breathing is fine until she started having pain, which made her anxious and her breathing became labored. She is not currently in any pain today and reports no pain with activity less than 2 hours. Catherine Phillips increased her Crestor from 20mg  to 40mg  one month ago. The swelling in her right ankle started a few days ago and she has tried compression stockings and taken 2-3 lasix pills in the last 5 days. She does not regularly use Lasix. Swelling seems to be better today.  Pt does have a hx of claudication and abnormal ABI of leg leg back in 2018. She has been asymptomatic since then.    .. Active Ambulatory Problems    Diagnosis Date Noted  . History of Left leg claudication 06/24/2012  . Essential hypertension 06/24/2012  . Anxiety 06/24/2012  . Hyperlipidemia LDL goal <70 06/26/2012  . Osteoporosis 09/04/2012  . GERD (gastroesophageal reflux disease) 10/04/2012  . Pain in limb 12/04/2012  . Atherosclerosis of native artery of extremity with intermittent claudication ([place]) 12/04/2012  . Peripheral vascular disease ([place]) 01/22/2013  . COPD (chronic obstructive pulmonary disease) with emphysema Gold B 08/22/2013  . Peripheral neuropathy 10/28/2013  . Pulmonary nodules 04/28/2014  . Gastritis 12/03/2014  . Osteoarthritis, hand 04/19/2015  . COPD (chronic obstructive pulmonary disease) ([place]) 05/02/2015  . Leukocytosis  05/02/2015  . SOB (shortness of breath) 05/02/2015  . Dehydration 05/02/2015  . AKI (acute kidney injury) ([place]) 05/02/2015  . Epigastric abdominal pain 04/25/2016  . Hyperkalemia 05/22/2016  . Medication reaction 05/31/2016  . Renal artery stenosis ([place]) 06/21/2016  . Acute respiratory failure with hypoxia ([place]) 12/23/2016  . COPD exacerbation ([place]) 12/23/2016  . Nocturnal hypoxia 03/01/2018  . Chest pain with moderate risk of acute coronary syndrome 03/16/2018  . CKD (chronic kidney disease), stage III ([place]) 03/16/2018  . Precordial chest pain 05/26/2018  . Muscle cramping 05/26/2018  . Emphysema of lung ([place]) 06/05/2018  . Umbilical hernia without obstruction and without gangrene 06/21/2018  . Chronic respiratory failure with hypoxia ([place]) 06/21/2018  . Abdominal distention 06/21/2018  . Diastolic congestive heart failure ([place]) 07/16/2018   Resolved Ambulatory Problems    Diagnosis Date Noted  . COPD exacerbation ([place]) 08/09/2014  . CAP (community acquired pneumonia) 08/09/2014  . COPD exacerbation ([place]) 05/02/2015   Past Medical History:  Diagnosis Date  . Diverticulosis   . Emphysema 06/24/2012  . Hypertension   . Renal disorder   . Shortness of breath dyspnea        Review of Systems  Respiratory: Negative for cough, shortness of breath and wheezing.   Cardiovascular: Positive for leg swelling. Negative for chest pain.  Musculoskeletal: Positive for back pain and myalgias.       Objective:   Physical Exam  Constitutional: She appears well-developed and well-nourished. No distress.  Cardiovascular: Normal rate and regular rhythm. Exam reveals no gallop and no friction  rub.  No murmur heard. Pulmonary/Chest: Effort normal and breath sounds normal. No respiratory distress. She has no wheezes. She has no rales.  Musculoskeletal: She exhibits edema.       Right hip: She exhibits normal range of motion and normal strength.       Left hip: She exhibits normal range  of motion and normal strength.       Right ankle: She exhibits swelling.       Lumbar back: She exhibits normal range of motion.       Legs: Neurological: No sensory deficit. She exhibits normal muscle tone.  Psychiatric: She has a normal mood and affect. Her behavior is normal. Judgment and thought content normal.          Assessment & Plan:  Marland KitchenMarland KitchenDiagnoses and all orders for this visit:  Muscle cramping  Peripheral vascular disease (HCC)  Intermittent claudication (HCC)  Bilateral leg pain  Acute left-sided low back pain without sciatica   Symptoms seem consistent with claudication from her peripheral vascular disease.  Until this current episode she has been relatively asymptomatic.  I do suggest her following up with her vascular surgeon to reevaluate her claudication.  I know she really does want to go back to work and be able to work for at least 8 hours.  Continue with compression for lower extremity edema only use Lasix when needed for edema.  I had originally thought that increasing Crestor could be causing some increased myalgias.  I encouraged her to decrease Crestor back down to 20 mg.  She called back and realize she had still only been taking the 20 mg.  She never increase to the 40 mg.  I do not think this would be the cause of her current episode of pain.  Go ahead and increase to 40 mg of Crestor as her cardiologist suggested.  I do think patient has some low back pain likely due to her lumbar degenerative disc disease and muscle spasms.  Discussed some exercises to start.  Strongly encouraged heat, Biofreeze, diclofenac gel and exercises.  Follow-up as needed.  [name].Spent 30 minutes with patient and greater than 50 percent of visit spent counseling patient regarding treatment plan.   Marland KitchenVernetta Honey PA-C, have reviewed and agree with the above documentation in it's entirety.

## 2018-08-22 ENCOUNTER — Telehealth: Payer: Self-pay

## 2018-08-22 NOTE — Telephone Encounter (Signed)
Catherine Phillips called today saying that she was going to call the vascular doctor, but she didn't know if a referral needed to be placed before she calls? Thanks!

## 2018-08-23 NOTE — Telephone Encounter (Signed)
Okay. I called and notified patient, she is making an appointment to see the vascular doctor today. No further questions or concerns at this time.

## 2018-08-23 NOTE — Telephone Encounter (Signed)
No she is already established. They can also see her note from the other day in office.

## 2018-08-27 ENCOUNTER — Ambulatory Visit (INDEPENDENT_AMBULATORY_CARE_PROVIDER_SITE_OTHER): Payer: Medicare Other | Admitting: Cardiology

## 2018-08-27 ENCOUNTER — Encounter: Payer: Self-pay | Admitting: Cardiology

## 2018-08-27 VITALS — BP 124/60 | HR 67 | Ht 62.0 in | Wt 128.2 lb

## 2018-08-27 DIAGNOSIS — J438 Other emphysema: Secondary | ICD-10-CM | POA: Diagnosis not present

## 2018-08-27 DIAGNOSIS — N183 Chronic kidney disease, stage 3 unspecified: Secondary | ICD-10-CM

## 2018-08-27 DIAGNOSIS — I10 Essential (primary) hypertension: Secondary | ICD-10-CM

## 2018-08-27 DIAGNOSIS — M79604 Pain in right leg: Secondary | ICD-10-CM | POA: Diagnosis not present

## 2018-08-27 DIAGNOSIS — M79605 Pain in left leg: Secondary | ICD-10-CM | POA: Diagnosis not present

## 2018-08-27 DIAGNOSIS — I739 Peripheral vascular disease, unspecified: Secondary | ICD-10-CM

## 2018-08-27 DIAGNOSIS — I701 Atherosclerosis of renal artery: Secondary | ICD-10-CM

## 2018-08-27 DIAGNOSIS — E785 Hyperlipidemia, unspecified: Secondary | ICD-10-CM | POA: Diagnosis not present

## 2018-08-27 NOTE — Assessment & Plan Note (Signed)
Followed by Maryanna Shape pulmonary. She is on O2 Q HS

## 2018-08-27 NOTE — Assessment & Plan Note (Signed)
Known LIA occlusion and Rt RAS. Seen by Dr Dr Doren Custard in 2014

## 2018-08-27 NOTE — Assessment & Plan Note (Signed)
Last SCr 1.5, GFR 36

## 2018-08-27 NOTE — Progress Notes (Signed)
08/27/2018 Catherine Phillips   02-02-1946  [id number]  Primary Physician Donella Stade, PA-C Primary Cardiologist: Dr Stanford Breed- Dr Gwenlyn Found [facility]  HPI: Catherine Phillips is a pleasant 72 year old female who is been followed by Dr. Stanford Breed and Dr. Alvester Chou for peripheral vascular disease.  The patient has known vascular disease.  Dr. Doren Custard did an angiogram in 2014 that showed a chronic left iliac occlusion.  He told the patient then that if her symptoms became worse he would recommend a femorofemoral bypass the patient saw Dr. Alvester Chou for renal artery stenosis.  She has bilateral renal artery stenosis and her right kidney appears to be nonfunctioning.  She has chronic renal insufficiency stage III with a creatinine of 1.5, she is followed by the renal service.  Her blood pressure has been controlled and at this time the plan was to follow her blood pressure and renal function.  If her blood pressure became uncontrolled her renal function worsened we will consider left renal artery intervention.  Other medical problems include COPD, she is on O2 at night.  She has dyslipidemia and Dr. Stanford Breed had increased her Crestor to 40 mg at his last visit.  Echocardiogram in June 2019 showed her EF to be 60 to 65% with grade 1 diastolic dysfunction, Myoview study in August 2019 was low risk.  Patient is in the office today complaining of leg pain.  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  She says if she stands most of the days she could swelling and pain in her legs.  It is hard to determine whether or not she has claudication because she cannot walk far with her COPD.  Basically she thinks her leg pain is definitely increased in the last 2 months.  She wanted to see Dr. Alvester Chou today for his opinion.   Current Outpatient Medications  Medication Sig Dispense Refill  . acetaminophen (TYLENOL) 500 MG tablet Take 1,000 mg by mouth every 6 (six) hours as needed (pain).    [name] albuterol (PROVENTIL HFA;VENTOLIN HFA) 108 (90 Base)  MCG/ACT inhaler Inhale 1-2 puffs into the lungs every 4 (four) hours as needed for wheezing or shortness of breath. 1 Inhaler 3  . albuterol (PROVENTIL) (2.5 MG/3ML) 0.083% nebulizer solution     . AMBULATORY NON FORMULARY MEDICATION Hypoxia with Room Air at 83 percent. Needs portable oxygen tank 2L O2 continuous and as needed for O2 stats below 90. 1 Device 0  . AMBULATORY NON FORMULARY MEDICATION Medication Name: Battery pack portable oxygen concentrator 1 Product 0  . amLODipine (NORVASC) 10 MG tablet Take 1 tablet (10 mg total) by mouth daily. 90 tablet 3  . aspirin EC 81 MG tablet Take 1 tablet (81 mg total) by mouth daily. 90 tablet 3  . famotidine (PEPCID) 20 MG tablet Take 1 tablet (20 mg total) by mouth at bedtime. 30 tablet 5  . Fluticasone-Umeclidin-Vilant (TRELEGY ELLIPTA) 100-62.5-25 MCG/INH AEPB Inhale 1 puff into the lungs daily. 28 each 5  . furosemide (LASIX) 20 MG tablet Take one as needed for lower extremity edema. 30 tablet 0  . labetalol (NORMODYNE) 200 MG tablet Take 1 tablet (200 mg total) by mouth 2 (two) times daily. 180 tablet 3  . LYRICA 100 MG capsule TAKE 1 CAPSULE BY MOUTH TWICE A DAY 180 capsule 1  . pantoprazole (PROTONIX) 20 MG tablet Take 1 tablet (20 mg total) by mouth daily. 90 tablet 1  . rosuvastatin (CRESTOR) 40 MG tablet Take 1 tablet (40 mg total) by  mouth daily. 90 tablet 3   No current facility-administered medications for this visit.     Allergies  Allergen Reactions  . Codeine Nausea And Vomiting  . Levaquin [Levofloxacin In D5w] Itching and Rash  . Sulfamethoxazole-Trimethoprim Other (See Comments)    Hyperkalemia (a high level of the electrolyte potassium in the blood)    Past Medical History:  Diagnosis Date  . Anxiety 06/24/2012  . COPD (chronic obstructive pulmonary disease) ([place])   . Diverticulosis   . Emphysema 06/24/2012  . Emphysema of lung ([place])   . Essential hypertension 06/24/2012  . History of Left leg claudication 06/24/2012  .  Hypertension   . Peripheral vascular disease ([place])   . Renal disorder    Renal Insufficiency R=30%  . Shortness of breath dyspnea     Social History   Socioeconomic History  . Marital status: Married    Spouse name: Not on file  . Number of children: Not on file  . Years of education: Not on file  . Highest education level: Not on file  Occupational History  . Not on file  Social Needs  . Financial resource strain: Not on file  . Food insecurity:    Worry: Not on file    Inability: Not on file  . Transportation needs:    Medical: Not on file    Non-medical: Not on file  Tobacco Use  . Smoking status: Former Smoker    Packs/day: 1.50    Years: 30.00    Pack years: 45.00    Types: Cigarettes    Last attempt to quit: 12/30/2009    Years since quitting: 8.6  . Smokeless tobacco: Never Used  Substance and Sexual Activity  . Alcohol use: No  . Drug use: No  . Sexual activity: Not on file  Lifestyle  . Physical activity:    Days per week: Not on file    Minutes per session: Not on file  . Stress: Not on file  Relationships  . Social connections:    Talks on phone: Not on file    Gets together: Not on file    Attends religious service: Not on file    Active member of club or organization: Not on file    Attends meetings of clubs or organizations: Not on file    Relationship status: Not on file  . Intimate partner violence:    Fear of current or ex partner: Not on file    Emotionally abused: Not on file    Physically abused: Not on file    Forced sexual activity: Not on file  Other Topics Concern  . Not on file  Social History Narrative  . Not on file     Family History  Problem Relation Age of Onset  . Cancer Mother   . Heart attack Father   . Deep vein thrombosis Father   . Hyperlipidemia Father   . Hypertension Father   . Stomach cancer Brother      Review of Systems: General: negative for chills, fever, night sweats or weight changes.    Cardiovascular: negative for chest pain, palpitations, paroxysmal nocturnal dyspnea Dermatological: negative for rash Respiratory: negative for cough or wheezing Urologic: negative for hematuria Abdominal: negative for nausea, vomiting, diarrhea, bright red blood per rectum, melena, or hematemesis Neurologic: negative for visual changes, syncope, or dizziness All other systems reviewed and are otherwise negative except as noted above.    Blood pressure 124/60, pulse 67, height 5\' 2"  (1.575 m),  weight 128 lb 4 oz (58.2 kg).  General appearance: alert, cooperative, appears older than stated age, no distress and on O2 Lungs: decreased breath sounds but clear Heart: regular rate and rhythm Extremities: no edema, diminished distal LE pulses bilaterally  Neurologic: Grossly normal    ASSESSMENT AND PLAN:   Bilateral leg pain Difficult to determine whether this is claudication or not, the patient is really unable to walk any distance because of COPD.  Peripheral vascular disease (HCC) Known LIA occlusion and Rt RAS. Seen by Dr Dr Doren Custard in 2014  Renal artery stenosis [facility]) 60-99% by doppler Oct 2019. Right kidney was small. Plan is to follow B/P and renal function  COPD (chronic obstructive pulmonary disease) with emphysema Gold B Followed by [facility]. She is on O2 Q HS  Essential hypertension Controlled  CKD (chronic kidney disease), stage III (HCC) Last SCr 1.5, GFR 36  Hyperlipidemia LDL goal <70 LDL 83 June 2019- on Crestor   PLAN discussed with Dr. Alvester Chou in the office today.  We will go ahead and obtain lower extremity arterial Dopplers and then he will see her in follow-up to discuss this.  Kerin Ransom PA-C 08/27/2018 10:00 AM

## 2018-08-27 NOTE — Patient Instructions (Addendum)
Medication Instructions:  The current medical regimen is effective;  continue present plan and medications.  If you need a refill on your cardiac medications before your next appointment, please call your pharmacy.   Lab work: None Ordered. If you have labs (blood work) drawn today and your tests are completely normal, you will receive your results only by: [name] MyChart Message (if you have MyChart) OR . A paper copy in the mail If you have any lab test that is abnormal or we need to change your treatment, we will call you to review the results.  Testing/Procedures: Your physician has requested that you have a lower extremity venous duplex. This test is an ultrasound of the veins in the legs. It looks at venous blood flow that carries blood from the heart to the legs. Allow one hour for a Lower Venous exam. There are no restrictions or special instructions.  Follow-Up: At [facility], you and your health needs are our priority.  As part of our continuing mission to provide you with exceptional heart care, we have created designated Provider Care Teams.  These Care Teams include your primary Cardiologist (physician) and Advanced Practice Providers (APPs -  Physician Assistants and Nurse Practitioners) who all work together to provide you with the care you need, when you need it. You will need a follow up appointment after Venous Doppler You may see Dr.Berry or one of the following Advanced Practice Providers on your designated Care Team:   Kerin Ransom, PA-C Roby Lofts, [place] . Sande Rives, PA-C  Any Other Special Instructions Will Be Listed Below (If Applicable). None

## 2018-08-27 NOTE — Assessment & Plan Note (Signed)
Controlled.  

## 2018-08-27 NOTE — Assessment & Plan Note (Signed)
Difficult to determine whether this is claudication or not, the patient is really unable to walk any distance because of COPD.

## 2018-08-27 NOTE — Assessment & Plan Note (Signed)
60-99% by doppler Oct 2019. Right kidney was small. Plan is to follow B/P and renal function

## 2018-08-27 NOTE — Assessment & Plan Note (Signed)
LDL 83 June 2019- on Crestor

## 2018-08-28 ENCOUNTER — Ambulatory Visit (HOSPITAL_COMMUNITY)
Admission: RE | Admit: 2018-08-28 | Discharge: 2018-08-28 | Disposition: A | Discharge status: Home / Self Care | Payer: Medicare Other | Source: Ambulatory Visit | Attending: Cardiovascular Disease | Admitting: Cardiovascular Disease

## 2018-08-28 ENCOUNTER — Other Ambulatory Visit: Payer: Self-pay | Admitting: Cardiology

## 2018-08-28 DIAGNOSIS — R601 Generalized edema: Secondary | ICD-10-CM

## 2018-08-28 DIAGNOSIS — I739 Peripheral vascular disease, unspecified: Secondary | ICD-10-CM | POA: Diagnosis not present

## 2018-09-03 ENCOUNTER — Encounter: Payer: Self-pay | Admitting: *Deleted

## 2018-09-03 ENCOUNTER — Other Ambulatory Visit: Payer: Self-pay | Admitting: Physician Assistant

## 2018-09-04 ENCOUNTER — Ambulatory Visit (HOSPITAL_COMMUNITY)
Admission: RE | Admit: 2018-09-04 | Discharge: 2018-09-04 | Disposition: A | Discharge status: Home / Self Care | Payer: Medicare Other | Source: Ambulatory Visit | Attending: Cardiovascular Disease | Admitting: Cardiovascular Disease

## 2018-09-04 ENCOUNTER — Encounter: Payer: Self-pay | Admitting: Cardiovascular Disease

## 2018-09-04 ENCOUNTER — Ambulatory Visit (INDEPENDENT_AMBULATORY_CARE_PROVIDER_SITE_OTHER): Payer: Medicare Other | Admitting: Cardiovascular Disease

## 2018-09-04 ENCOUNTER — Other Ambulatory Visit: Payer: Self-pay

## 2018-09-04 VITALS — BP 128/60 | HR 60 | Ht 62.0 in | Wt 130.0 lb

## 2018-09-04 DIAGNOSIS — I739 Peripheral vascular disease, unspecified: Secondary | ICD-10-CM

## 2018-09-04 DIAGNOSIS — N183 Chronic kidney disease, stage 3 unspecified: Secondary | ICD-10-CM

## 2018-09-04 DIAGNOSIS — I701 Atherosclerosis of renal artery: Secondary | ICD-10-CM | POA: Diagnosis not present

## 2018-09-04 NOTE — H&P (View-Only) (Signed)
09/04/2018 Catherine Phillips   1946-03-04  [id number]  Primary Physician Donella Stade, PA-C Primary Cardiologist: Lorretta Harp MD [name], [place]  HPI:  Catherine Phillips is a 72 y.o.  moderately overweight married Caucasian female mother of 38, grandmother for grandchildren is accompanied by her husband checked today.  She was referred by Dr. Stanford Breed for peripheral vascular evaluation because of renal artery stenosis.    I last saw her in the office 07/19/2018.  She does have a history of 100 pack years of tobacco abuse having quit 8 years ago as well history of hypertension hyperlipidemia.  Father died of a microinfarction age 74.  She is never had a heart attack or stroke.  She does have COPD and chronic shortness of breath but denies chest pain.  She also has known PAD and claudication in the past week which no longer is a clinical issue.  She is had known right renal artery stenosis by duplex ultrasound in the past as well with recent Dopplers suggest a small nonfunctioning right kidney as well as bilateral renal artery stenosis with a left kidney measuring approximately 10 cm.  Her blood pressure is controlled on 2 antihypertensive medications.  She does see a nephrologist and has creatinines that run in the mid 1 range.  Since I saw her almost 2 months ago she is noticed increasing lifestyle limiting claudication on the left preventing her from doing meaningful work.  Recent Dopplers performed 08/27/2018 revealed a right ABI of 1.01 and a left ABI 0.65.  She had an occluded left external iliac artery.  She wishes to proceed with angiography and potential endovascular therapy for lifestyle and claudication.  Current Meds  Medication Sig  . acetaminophen (TYLENOL) 500 MG tablet Take 1,000 mg by mouth every 6 (six) hours as needed (pain).  [name] albuterol (PROVENTIL HFA;VENTOLIN HFA) 108 (90 Base) MCG/ACT inhaler Inhale 1-2 puffs into the lungs every 4 (four) hours as needed for wheezing  or shortness of breath.  [name] albuterol (PROVENTIL) (2.5 MG/3ML) 0.083% nebulizer solution   . AMBULATORY NON FORMULARY MEDICATION Hypoxia with Room Air at 83 percent. Needs portable oxygen tank 2L O2 continuous and as needed for O2 stats below 90.  [name] AMBULATORY NON FORMULARY MEDICATION Medication Name: Battery pack portable oxygen concentrator  . amLODipine (NORVASC) 10 MG tablet Take 1 tablet (10 mg total) by mouth daily.  [name] aspirin EC 81 MG tablet Take 1 tablet (81 mg total) by mouth daily.  . famotidine (PEPCID) 20 MG tablet Take 1 tablet (20 mg total) by mouth at bedtime.  . Fluticasone-Umeclidin-Vilant (TRELEGY ELLIPTA) 100-62.5-25 MCG/INH AEPB Inhale 1 puff into the lungs daily.  [name] labetalol (NORMODYNE) 200 MG tablet Take 1 tablet (200 mg total) by mouth 2 (two) times daily.  [name] LYRICA 100 MG capsule TAKE 1 CAPSULE BY MOUTH TWICE A DAY  . pantoprazole (PROTONIX) 20 MG tablet Take 1 tablet (20 mg total) by mouth daily.  . rosuvastatin (CRESTOR) 40 MG tablet Take 1 tablet (40 mg total) by mouth daily.     Allergies  Allergen Reactions  . Codeine Nausea And Vomiting  . Levaquin [Levofloxacin In D5w] Itching and Rash  . Sulfamethoxazole-Trimethoprim Other (See Comments)    Hyperkalemia (a high level of the electrolyte potassium in the blood)    Social History   Socioeconomic History  . Marital status: Married    Spouse name: Not on file  . Number of children: Not on file  .  Years of education: Not on file  . Highest education level: Not on file  Occupational History  . Not on file  Social Needs  . Financial resource strain: Not on file  . Food insecurity:    Worry: Not on file    Inability: Not on file  . Transportation needs:    Medical: Not on file    Non-medical: Not on file  Tobacco Use  . Smoking status: Former Smoker    Packs/day: 1.50    Years: 30.00    Pack years: 45.00    Types: Cigarettes    Last attempt to quit: 12/30/2009    Years since quitting: 8.6  .  Smokeless tobacco: Never Used  Substance and Sexual Activity  . Alcohol use: No  . Drug use: No  . Sexual activity: Not on file  Lifestyle  . Physical activity:    Days per week: Not on file    Minutes per session: Not on file  . Stress: Not on file  Relationships  . Social connections:    Talks on phone: Not on file    Gets together: Not on file    Attends religious service: Not on file    Active member of club or organization: Not on file    Attends meetings of clubs or organizations: Not on file    Relationship status: Not on file  . Intimate partner violence:    Fear of current or ex partner: Not on file    Emotionally abused: Not on file    Physically abused: Not on file    Forced sexual activity: Not on file  Other Topics Concern  . Not on file  Social History Narrative  . Not on file     Review of Systems: General: negative for chills, fever, night sweats or weight changes.  Cardiovascular: negative for chest pain, dyspnea on exertion, edema, orthopnea, palpitations, paroxysmal nocturnal dyspnea or shortness of breath Dermatological: negative for rash Respiratory: negative for cough or wheezing Urologic: negative for hematuria Abdominal: negative for nausea, vomiting, diarrhea, bright red blood per rectum, melena, or hematemesis Neurologic: negative for visual changes, syncope, or dizziness All other systems reviewed and are otherwise negative except as noted above.    Blood pressure 128/60, pulse 60, height 5\' 2"  (1.575 m), weight 130 lb (59 kg).  General appearance: alert and no distress Neck: no adenopathy, no carotid bruit, no JVD, supple, symmetrical, trachea midline and thyroid not enlarged, symmetric, no tenderness/mass/nodules Lungs: clear to auscultation bilaterally Heart: regular rate and rhythm, S1, S2 normal, no murmur, click, rub or gallop Extremities: extremities normal, atraumatic, no cyanosis or edema Pulses: Absent left pedal pulse Skin: Skin  color, texture, turgor normal. No rashes or lesions Neurologic: Alert and oriented X 3, normal strength and tone. Normal symmetric reflexes. Normal coordination and gait  EKG not performed today  ASSESSMENT AND PLAN:   Peripheral vascular disease ([place]) History of PAD with a known occluded left external iliac artery demonstrated by angiogram 12/09/2012 by Dr. Scot Dock .  Apparently Dr. Scot Dock felt that should the patient require vascularization she would need surgical intervention.  Her symptoms have gotten considerably worse clinically.  Is preventing her from doing meaningful work.  She was referred to me by Dr. Stanford Breed.  She wishes to have me perform her peripheral angiogram.      Lorretta Harp MD [facility], [facility] 09/04/2018 11:43 AM

## 2018-09-04 NOTE — Progress Notes (Signed)
09/04/2018 Catherine Phillips   [birth year]  [id number]  Primary Physician Donella Stade, PA-C Primary Cardiologist: Lorretta Harp MD [name], [place]  HPI:  Catherine Phillips is a 72 y.o.  moderately overweight married Caucasian female mother of 67, grandmother for grandchildren is accompanied by her husband checked today.  She was referred by Dr. Stanford Breed for peripheral vascular evaluation because of renal artery stenosis.    I last saw her in the office 07/19/2018.  She does have a history of 100 pack years of tobacco abuse having quit 8 years ago as well history of hypertension hyperlipidemia.  Father died of a microinfarction age 29.  She is never had a heart attack or stroke.  She does have COPD and chronic shortness of breath but denies chest pain.  She also has known PAD and claudication in the past week which no longer is a clinical issue.  She is had known right renal artery stenosis by duplex ultrasound in the past as well with recent Dopplers suggest a small nonfunctioning right kidney as well as bilateral renal artery stenosis with a left kidney measuring approximately 10 cm.  Her blood pressure is controlled on 2 antihypertensive medications.  She does see a nephrologist and has creatinines that run in the mid 1 range.  Since I saw her almost 2 months ago she is noticed increasing lifestyle limiting claudication on the left preventing her from doing meaningful work.  Recent Dopplers performed 08/27/2018 revealed a right ABI of 1.01 and a left ABI 0.65.  She had an occluded left external iliac artery.  She wishes to proceed with angiography and potential endovascular therapy for lifestyle and claudication.  Current Meds  Medication Sig  . acetaminophen (TYLENOL) 500 MG tablet Take 1,000 mg by mouth every 6 (six) hours as needed (pain).  [name] albuterol (PROVENTIL HFA;VENTOLIN HFA) 108 (90 Base) MCG/ACT inhaler Inhale 1-2 puffs into the lungs every 4 (four) hours as needed for wheezing  or shortness of breath.  [name] albuterol (PROVENTIL) (2.5 MG/3ML) 0.083% nebulizer solution   . AMBULATORY NON FORMULARY MEDICATION Hypoxia with Room Air at 83 percent. Needs portable oxygen tank 2L O2 continuous and as needed for O2 stats below 90.  [name] AMBULATORY NON FORMULARY MEDICATION Medication Name: Battery pack portable oxygen concentrator  . amLODipine (NORVASC) 10 MG tablet Take 1 tablet (10 mg total) by mouth daily.  [name] aspirin EC 81 MG tablet Take 1 tablet (81 mg total) by mouth daily.  . famotidine (PEPCID) 20 MG tablet Take 1 tablet (20 mg total) by mouth at bedtime.  . Fluticasone-Umeclidin-Vilant (TRELEGY ELLIPTA) 100-62.5-25 MCG/INH AEPB Inhale 1 puff into the lungs daily.  [name] labetalol (NORMODYNE) 200 MG tablet Take 1 tablet (200 mg total) by mouth 2 (two) times daily.  [name] LYRICA 100 MG capsule TAKE 1 CAPSULE BY MOUTH TWICE A DAY  . pantoprazole (PROTONIX) 20 MG tablet Take 1 tablet (20 mg total) by mouth daily.  . rosuvastatin (CRESTOR) 40 MG tablet Take 1 tablet (40 mg total) by mouth daily.     Allergies  Allergen Reactions  . Codeine Nausea And Vomiting  . Levaquin [Levofloxacin In D5w] Itching and Rash  . Sulfamethoxazole-Trimethoprim Other (See Comments)    Hyperkalemia (a high level of the electrolyte potassium in the blood)    Social History   Socioeconomic History  . Marital status: Married    Spouse name: Not on file  . Number of children: Not on file  .  Years of education: Not on file  . Highest education level: Not on file  Occupational History  . Not on file  Social Needs  . Financial resource strain: Not on file  . Food insecurity:    Worry: Not on file    Inability: Not on file  . Transportation needs:    Medical: Not on file    Non-medical: Not on file  Tobacco Use  . Smoking status: Former Smoker    Packs/day: 1.50    Years: 30.00    Pack years: 45.00    Types: Cigarettes    Last attempt to quit: 12/30/2009    Years since quitting: 8.6  .  Smokeless tobacco: Never Used  Substance and Sexual Activity  . Alcohol use: No  . Drug use: No  . Sexual activity: Not on file  Lifestyle  . Physical activity:    Days per week: Not on file    Minutes per session: Not on file  . Stress: Not on file  Relationships  . Social connections:    Talks on phone: Not on file    Gets together: Not on file    Attends religious service: Not on file    Active member of club or organization: Not on file    Attends meetings of clubs or organizations: Not on file    Relationship status: Not on file  . Intimate partner violence:    Fear of current or ex partner: Not on file    Emotionally abused: Not on file    Physically abused: Not on file    Forced sexual activity: Not on file  Other Topics Concern  . Not on file  Social History Narrative  . Not on file     Review of Systems: General: negative for chills, fever, night sweats or weight changes.  Cardiovascular: negative for chest pain, dyspnea on exertion, edema, orthopnea, palpitations, paroxysmal nocturnal dyspnea or shortness of breath Dermatological: negative for rash Respiratory: negative for cough or wheezing Urologic: negative for hematuria Abdominal: negative for nausea, vomiting, diarrhea, bright red blood per rectum, melena, or hematemesis Neurologic: negative for visual changes, syncope, or dizziness All other systems reviewed and are otherwise negative except as noted above.    Blood pressure 128/60, pulse 60, height 5\' 2"  (1.575 m), weight 130 lb (59 kg).  General appearance: alert and no distress Neck: no adenopathy, no carotid bruit, no JVD, supple, symmetrical, trachea midline and thyroid not enlarged, symmetric, no tenderness/mass/nodules Lungs: clear to auscultation bilaterally Heart: regular rate and rhythm, S1, S2 normal, no murmur, click, rub or gallop Extremities: extremities normal, atraumatic, no cyanosis or edema Pulses: Absent left pedal pulse Skin: Skin  color, texture, turgor normal. No rashes or lesions Neurologic: Alert and oriented X 3, normal strength and tone. Normal symmetric reflexes. Normal coordination and gait  EKG not performed today  ASSESSMENT AND PLAN:   Peripheral vascular disease ([place]) History of PAD with a known occluded left external iliac artery demonstrated by angiogram 12/09/2012 by Dr. Scot Dock .  Apparently Dr. Scot Dock felt that should the patient require vascularization she would need surgical intervention.  Her symptoms have gotten considerably worse clinically.  Is preventing her from doing meaningful work.  She was referred to me by Dr. Stanford Breed.  She wishes to have me perform her peripheral angiogram.      Lorretta Harp MD [facility], [facility] 09/04/2018 11:43 AM

## 2018-09-04 NOTE — Patient Instructions (Addendum)
    [place] [address] Dept: [phone] Loc: [place]  09/04/2018  You are scheduled for a Peripheral Angiogram on Thursday, January 9 with Dr. Quay Burow.  1. Please arrive at the [facility] (Main Entrance A) at [facility]: [address] [place], [place] [address] at 5:30 AM (This time is two hours before your procedure to ensure your preparation). Free valet parking service is available.   Special note: Every effort is made to have your procedure done on time. Please understand that emergencies sometimes delay scheduled procedures.  2. Diet: Do not eat solid foods after midnight.  The patient may have clear liquids until 5am upon the day of the procedure.  3. Labs: You will need to have blood drawn TODAY 4. Medication instructions in preparation for your procedure:   On the morning of your procedure, take your Aspirin and any morning medicines NOT listed.  You may use sips of water.  5. Plan for one night stay--bring personal belongings. 6. Bring a current list of your medications and current insurance cards. 7. You MUST have a responsible person to drive you home. 8. Someone MUST be with you the first 24 hours after you arrive home or your discharge will be delayed. 9. Please wear clothes that are easy to get on and off and wear slip-on shoes.  Thank you for allowing [place] to care for you!   -- [facility]   SPECIAL INSTRUCTIONS:  Your physician has requested that you have a lower or upper extremity arterial duplex. This test is an ultrasound of the arteries in the legs or arms. It looks at arterial blood flow in the legs and arms. Allow one hour for Lower and Upper Arterial scans. There are no restrictions or special instructions  Your physician has requested that you have an ankle brachial index  (ABI). During this test an ultrasound and blood pressure cuff are used to evaluate the arteries that supply the arms and legs with blood. Allow thirty minutes for this exam. There are no restrictions or special instructions.  SCHEDULE 1-2 WEEKS AFTER September 26, 2018   FOLLOW UP WITH DR. Gwenlyn Found 2 WEEKS AFTER 09/26/2018

## 2018-09-04 NOTE — Assessment & Plan Note (Signed)
History of PAD with a known occluded left external iliac artery demonstrated by angiogram 12/09/2012 by Dr. Scot Dock .  Apparently Dr. Scot Dock felt that should the patient require vascularization she would need surgical intervention.  Her symptoms have gotten considerably worse clinically.  Is preventing her from doing meaningful work.  She was referred to me by Dr. Stanford Breed.  She wishes to have me perform her peripheral angiogram.

## 2018-09-05 ENCOUNTER — Other Ambulatory Visit: Payer: Self-pay | Admitting: Physician Assistant

## 2018-09-05 LAB — BASIC METABOLIC PANEL
BUN/Creatinine Ratio: 19 (ref 12–28)
BUN: 28 mg/dL — ABNORMAL HIGH (ref 8–27)
CO2: 16 mmol/L — ABNORMAL LOW (ref 20–29)
Calcium: 9.6 mg/dL (ref 8.7–10.3)
Chloride: 108 mmol/L — ABNORMAL HIGH (ref 96–106)
Creatinine, Ser: 1.51 mg/dL — ABNORMAL HIGH (ref 0.57–1.00)
GFR calc Af Amer: 40 mL/min/{1.73_m2} — ABNORMAL LOW (ref >59)
GFR calc non Af Amer: 34 mL/min/{1.73_m2} — ABNORMAL LOW (ref >59)
Glucose: 89 mg/dL (ref 65–99)
Potassium: 5 mmol/L (ref 3.5–5.2)
Sodium: 145 mmol/L — ABNORMAL HIGH (ref 134–144)

## 2018-09-05 LAB — CBC
Hematocrit: 44.7 % (ref 34.0–46.6)
Hemoglobin: 14.6 g/dL (ref 11.1–15.9)
MCH: 30.9 pg (ref 26.6–33.0)
MCHC: 32.7 g/dL (ref 31.5–35.7)
MCV: 95 fL (ref 79–97)
Platelets: 237 10*3/uL (ref 150–450)
RBC: 4.72 x10E6/uL (ref 3.77–5.28)
RDW: 14.6 % (ref 12.3–15.4)
WBC: 11.2 10*3/uL — ABNORMAL HIGH (ref 3.4–10.8)

## 2018-09-09 DIAGNOSIS — E785 Hyperlipidemia, unspecified: Secondary | ICD-10-CM | POA: Diagnosis not present

## 2018-09-09 LAB — HEPATIC FUNCTION PANEL
ALT: 7 IU/L (ref 0–32)
AST: 13 IU/L (ref 0–40)
Albumin: 4.2 g/dL (ref 3.5–4.8)
Alkaline Phosphatase: 38 IU/L — ABNORMAL LOW (ref 39–117)
Bilirubin Total: 0.3 mg/dL (ref 0.0–1.2)
Bilirubin, Direct: 0.11 mg/dL (ref 0.00–0.40)
Total Protein: 6.1 g/dL (ref 6.0–8.5)

## 2018-09-09 LAB — LIPID PANEL
CHOL/HDL RATIO: 3.3 ratio (ref 0.0–4.4)
Cholesterol, Total: 153 mg/dL (ref 100–199)
HDL: 46 mg/dL (ref >39)
LDL Calculated: 67 mg/dL (ref 0–99)
TRIGLYCERIDES: 199 mg/dL — AB (ref 0–149)
VLDL Cholesterol Cal: 40 mg/dL (ref 5–40)

## 2018-09-12 ENCOUNTER — Encounter: Payer: Self-pay | Admitting: *Deleted

## 2018-09-19 ENCOUNTER — Telehealth: Payer: Self-pay | Admitting: Cardiovascular Disease

## 2018-09-19 NOTE — Telephone Encounter (Signed)
09/19/2018 Received FMLA form back from [place] for Dr. Gwenlyn Found to sign, I gave form to [facility]. cbr

## 2018-09-20 NOTE — Telephone Encounter (Signed)
09/20/2018 Received signed form back from Dr. Gwenlyn Found, I inter- office it back to [facility] for completion.  cbr

## 2018-09-24 ENCOUNTER — Ambulatory Visit (INDEPENDENT_AMBULATORY_CARE_PROVIDER_SITE_OTHER): Payer: Medicare Other | Admitting: Physician Assistant

## 2018-09-24 ENCOUNTER — Encounter: Payer: Self-pay | Admitting: Physician Assistant

## 2018-09-24 VITALS — BP 150/58 | HR 62 | Ht 62.0 in | Wt 128.0 lb

## 2018-09-24 DIAGNOSIS — R600 Localized edema: Secondary | ICD-10-CM | POA: Diagnosis not present

## 2018-09-24 DIAGNOSIS — I739 Peripheral vascular disease, unspecified: Secondary | ICD-10-CM | POA: Diagnosis not present

## 2018-09-24 DIAGNOSIS — M25511 Pain in right shoulder: Secondary | ICD-10-CM | POA: Insufficient documentation

## 2018-09-24 DIAGNOSIS — I10 Essential (primary) hypertension: Secondary | ICD-10-CM

## 2018-09-24 MED ORDER — FUROSEMIDE 20 MG PO TABS: 20.0000 mg | ORAL_TABLET | Freq: Every day | ORAL | 0 refills | Status: DC

## 2018-09-24 NOTE — Patient Instructions (Signed)

## 2018-09-24 NOTE — Progress Notes (Signed)
Subjective:    Patient ID: Catherine Phillips, female    DOB: [birth year], 73 y.o.   MRN: [id number]  HPI  Pt is a 73 yo female with PVD, CKD, RAS, COPD, HTN who presents to the clinic with bilateral lower extremity edema for the past week. Pt denies any worsening SOB infact she feels like her breathing is better. She is scheduled for stent placement in left leg for PVD on Thursday, 2 days away.   COPD- she does continue on O2 every night. Pretty much off of it during the day. On daily Trelegy.   Swelling started this week. Both ankles. Worse at night and when been sitting for long periods of time. She sleeps with legs elevated and it does help. No CP or chest tightness. Echo in June 2019 normal EF. She has not tried anything to make better. Pt has had no medication changes in the last month.   Her right shoulder has been hurting for the past 3-4 weeks. No injury. Not doing anything new. She has not tried anything to make better. Hurts more with movement. Seems to be getting some better.   .. Active Ambulatory Problems    Diagnosis Date Noted  . Intermittent claudication ([place]) 06/24/2012  . Essential hypertension 06/24/2012  . Anxiety 06/24/2012  . Hyperlipidemia LDL goal <70 06/26/2012  . Osteoporosis 09/04/2012  . GERD (gastroesophageal reflux disease) 10/04/2012  . Pain in limb 12/04/2012  . Atherosclerosis of native artery of extremity with intermittent claudication ([place]) 12/04/2012  . Peripheral vascular disease ([place]) 01/22/2013  . COPD (chronic obstructive pulmonary disease) with emphysema Gold B 08/22/2013  . Peripheral neuropathy 10/28/2013  . Pulmonary nodules 04/28/2014  . Gastritis 12/03/2014  . Osteoarthritis, hand 04/19/2015  . COPD (chronic obstructive pulmonary disease) ([place]) 05/02/2015  . Leukocytosis 05/02/2015  . SOB (shortness of breath) 05/02/2015  . Dehydration 05/02/2015  . AKI (acute kidney injury) ([place]) 05/02/2015  . Epigastric abdominal pain 04/25/2016  .  Hyperkalemia 05/22/2016  . Medication reaction 05/31/2016  . Renal artery stenosis ([place]) 06/21/2016  . Acute respiratory failure with hypoxia ([place]) 12/23/2016  . COPD exacerbation ([place]) 12/23/2016  . Nocturnal hypoxia 03/01/2018  . Chest pain with moderate risk of acute coronary syndrome 03/16/2018  . CKD (chronic kidney disease), stage III ([place]) 03/16/2018  . Precordial chest pain 05/26/2018  . Muscle cramping 05/26/2018  . Emphysema of lung ([place]) 06/05/2018  . Umbilical hernia without obstruction and without gangrene 06/21/2018  . Chronic respiratory failure with hypoxia ([place]) 06/21/2018  . Abdominal distention 06/21/2018  . Diastolic congestive heart failure ([place]) 07/16/2018  . Bilateral leg pain 08/20/2018  . Acute left-sided low back pain without sciatica 08/20/2018  . Acute pain of right shoulder 09/24/2018  . Bilateral leg edema 09/24/2018   Resolved Ambulatory Problems    Diagnosis Date Noted  . COPD exacerbation ([place]) 08/09/2014  . CAP (community acquired pneumonia) 08/09/2014  . COPD exacerbation ([place]) 05/02/2015   Past Medical History:  Diagnosis Date  . Diverticulosis   . Emphysema 06/24/2012  . History of Left leg claudication 06/24/2012  . Hypertension   . Renal disorder   . Shortness of breath dyspnea      Review of Systems    see HPI.  Objective:   Physical Exam Vitals signs reviewed.  Constitutional:      Appearance: Normal appearance.  Cardiovascular:     Rate and Rhythm: Normal rate and regular rhythm.  Pulmonary:     Effort: Pulmonary effort is normal.  Breath sounds: Normal breath sounds.  Musculoskeletal:     Comments: NROM of right shoulder.  Some mild tenderness over anterior shoulder and over AC joint.  No bruising or swelling.  Positive pain with hawkins and external ROM.  Empty can negative.  No pain with internal ROM.  No tenderness over bicep.   Skin:    Comments: 1+scant edema around bilateral ankles. No erythema, warmth or  tenderness.   Neurological:     General: No focal deficit present.     Mental Status: She is alert and oriented to person, place, and time.           Assessment & Plan:  Marland KitchenMarland KitchenDebe was seen today for shoulder pain.  Diagnoses and all orders for this visit:  Bilateral leg edema -     furosemide (LASIX) 20 MG tablet; Take 1 tablet (20 mg total) by mouth daily. To use as needed for lower extremity edema.  Peripheral vascular disease ([place])  Essential hypertension  Acute pain of right shoulder   Likely swelling is from chronic venous stasis. She has been less active since vascular occulusion. Hopefully after surgery will be more mobile. Wear compression stockings and keep feet elevated. Lasix as needed. I do not think norvasc is causing she has been on for awhile.   Right shoulder pain likely bursisits. Will hold on injection due to upcoming procedure. Use voltaren gel, ice, start exercises. Follow up as needed.

## 2018-09-25 ENCOUNTER — Telehealth: Payer: Self-pay | Admitting: *Deleted

## 2018-09-25 NOTE — Telephone Encounter (Signed)
Pt contacted pre-LE angiogram  scheduled at [facility] for:7:30 AM Verified arrival time and place: [place] Entrance A at: 5:30 AM-no pre procedure hydration per Dr Gwenlyn Found  No solid food after midnight prior to cath, clear liquids until 5 AM day of procedure. Contrast allergy: no Verified no diabetes medications.  Hold: Furosemide-day before and day of-pt takes prn.  Except hold medications AM meds can be  taken pre-cath with sip of water including: ASA 81 mg  Confirmed patient has responsible person to drive home post procedure and for 24 hours after you arrive home:yes  Dr Gwenlyn Found aware GFR 34 Per Dr Wyn Forster pre procedure hydration.

## 2018-09-26 ENCOUNTER — Ambulatory Visit (HOSPITAL_COMMUNITY)
Admission: RE | Admit: 2018-09-26 | Discharge: 2018-09-26 | Disposition: A | Discharge status: Home / Self Care | Payer: Medicare Other | Attending: Cardiovascular Disease | Admitting: Cardiovascular Disease

## 2018-09-26 ENCOUNTER — Encounter (HOSPITAL_COMMUNITY): Payer: Self-pay | Admitting: Cardiovascular Disease

## 2018-09-26 ENCOUNTER — Other Ambulatory Visit: Payer: Self-pay

## 2018-09-26 ENCOUNTER — Encounter (HOSPITAL_COMMUNITY)
Admission: RE | Disposition: A | Discharge status: Home / Self Care | Payer: Self-pay | Source: Home / Self Care | Attending: Cardiovascular Disease

## 2018-09-26 DIAGNOSIS — Z79899 Other long term (current) drug therapy: Secondary | ICD-10-CM | POA: Diagnosis not present

## 2018-09-26 DIAGNOSIS — I739 Peripheral vascular disease, unspecified: Secondary | ICD-10-CM | POA: Diagnosis not present

## 2018-09-26 DIAGNOSIS — Z7982 Long term (current) use of aspirin: Secondary | ICD-10-CM | POA: Diagnosis not present

## 2018-09-26 DIAGNOSIS — Z87891 Personal history of nicotine dependence: Secondary | ICD-10-CM | POA: Insufficient documentation

## 2018-09-26 DIAGNOSIS — Z881 Allergy status to other antibiotic agents status: Secondary | ICD-10-CM | POA: Insufficient documentation

## 2018-09-26 DIAGNOSIS — I70212 Atherosclerosis of native arteries of extremities with intermittent claudication, left leg: Secondary | ICD-10-CM | POA: Insufficient documentation

## 2018-09-26 DIAGNOSIS — I701 Atherosclerosis of renal artery: Secondary | ICD-10-CM | POA: Diagnosis not present

## 2018-09-26 DIAGNOSIS — I745 Embolism and thrombosis of iliac artery: Secondary | ICD-10-CM | POA: Insufficient documentation

## 2018-09-26 DIAGNOSIS — Z885 Allergy status to narcotic agent status: Secondary | ICD-10-CM | POA: Insufficient documentation

## 2018-09-26 DIAGNOSIS — J449 Chronic obstructive pulmonary disease, unspecified: Secondary | ICD-10-CM | POA: Insufficient documentation

## 2018-09-26 HISTORY — PX: LOWER EXTREMITY ANGIOGRAPHY: CATH118251

## 2018-09-26 HISTORY — PX: ABDOMINAL AORTOGRAM: CATH118222

## 2018-09-26 SURGERY — LOWER EXTREMITY ANGIOGRAPHY
Anesthesia: LOCAL

## 2018-09-26 MED ORDER — FENTANYL CITRATE (PF) 100 MCG/2ML IJ SOLN
INTRAMUSCULAR | Status: AC
  Filled 2018-09-26: qty 2

## 2018-09-26 MED ORDER — SODIUM CHLORIDE 0.9% FLUSH: 3.0000 mL | Freq: Two times a day (BID) | INTRAVENOUS | Status: DC

## 2018-09-26 MED ORDER — SODIUM CHLORIDE 0.9 % IV SOLN: 250.0000 mL | INTRAVENOUS | Status: DC | PRN

## 2018-09-26 MED ORDER — HYDRALAZINE HCL 20 MG/ML IJ SOLN: 5.0000 mg | INTRAMUSCULAR | Status: DC | PRN

## 2018-09-26 MED ORDER — SODIUM CHLORIDE 0.9 % WEIGHT BASED INFUSION
3.0000 mL/kg/h | INTRAVENOUS | Status: AC
  Administered 2018-09-26: 3 mL/kg/h via INTRAVENOUS

## 2018-09-26 MED ORDER — SODIUM CHLORIDE 0.9% FLUSH: 3.0000 mL | INTRAVENOUS | Status: DC | PRN

## 2018-09-26 MED ORDER — LIDOCAINE HCL (PF) 1 % IJ SOLN
INTRAMUSCULAR | Status: AC
  Filled 2018-09-26: qty 30

## 2018-09-26 MED ORDER — ASPIRIN EC 81 MG PO TBEC: 81.0000 mg | DELAYED_RELEASE_TABLET | Freq: Every day | ORAL | Status: DC

## 2018-09-26 MED ORDER — MIDAZOLAM HCL 2 MG/2ML IJ SOLN
INTRAMUSCULAR | Status: AC
  Filled 2018-09-26: qty 2

## 2018-09-26 MED ORDER — FENTANYL CITRATE (PF) 100 MCG/2ML IJ SOLN
INTRAMUSCULAR | Status: DC | PRN
  Administered 2018-09-26: 25 ug via INTRAVENOUS

## 2018-09-26 MED ORDER — MIDAZOLAM HCL 2 MG/2ML IJ SOLN
INTRAMUSCULAR | Status: DC | PRN
  Administered 2018-09-26: 1 mg via INTRAVENOUS

## 2018-09-26 MED ORDER — LIDOCAINE HCL (PF) 1 % IJ SOLN
INTRAMUSCULAR | Status: DC | PRN
  Administered 2018-09-26: 15 mL

## 2018-09-26 MED ORDER — SODIUM CHLORIDE 0.9 % IV SOLN: INTRAVENOUS | Status: AC

## 2018-09-26 MED ORDER — ACETAMINOPHEN 325 MG PO TABS: 650.0000 mg | ORAL_TABLET | ORAL | Status: DC | PRN

## 2018-09-26 MED ORDER — ONDANSETRON HCL 4 MG/2ML IJ SOLN: 4.0000 mg | Freq: Four times a day (QID) | INTRAMUSCULAR | Status: DC | PRN

## 2018-09-26 MED ORDER — HEPARIN (PORCINE) IN NACL 1000-0.9 UT/500ML-% IV SOLN
INTRAVENOUS | Status: AC
  Filled 2018-09-26: qty 1000

## 2018-09-26 MED ORDER — IODIXANOL 320 MG/ML IV SOLN
INTRAVENOUS | Status: DC | PRN
  Administered 2018-09-26: 70 mL via INTRAVENOUS

## 2018-09-26 MED ORDER — SODIUM CHLORIDE 0.9 % WEIGHT BASED INFUSION: 1.0000 mL/kg/h | INTRAVENOUS | Status: DC

## 2018-09-26 MED ORDER — HEPARIN (PORCINE) IN NACL 1000-0.9 UT/500ML-% IV SOLN
INTRAVENOUS | Status: DC | PRN
  Administered 2018-09-26 (×2): 500 mL

## 2018-09-26 MED ORDER — ASPIRIN 81 MG PO CHEW: 81.0000 mg | CHEWABLE_TABLET | ORAL | Status: DC

## 2018-09-26 MED ORDER — LABETALOL HCL 5 MG/ML IV SOLN: 10.0000 mg | INTRAVENOUS | Status: DC | PRN

## 2018-09-26 SURGICAL SUPPLY — 13 items
CATH ANGIO 5F PIGTAIL 65CM (CATHETERS) ×1 IMPLANT
CATH CROSS OVER TEMPO 5F (CATHETERS) ×1 IMPLANT
CATH STRAIGHT 5FR 65CM (CATHETERS) ×1 IMPLANT
CLOSURE MYNX CONTROL 5F (Vascular Products) ×1 IMPLANT
KIT PV (KITS) ×3 IMPLANT
SHEATH PINNACLE 5F 10CM (SHEATH) ×1 IMPLANT
SHEATH PROBE COVER 6X72 (BAG) ×1 IMPLANT
STOPCOCK MORSE 400PSI 3WAY (MISCELLANEOUS) ×1 IMPLANT
SYR MEDRAD MARK 7 150ML (SYRINGE) ×3 IMPLANT
TRANSDUCER W/STOPCOCK (MISCELLANEOUS) ×3 IMPLANT
TRAY PV CATH (CUSTOM PROCEDURE TRAY) ×3 IMPLANT
TUBING CIL FLEX 10 FLL-RA (TUBING) ×1 IMPLANT
WIRE HITORQ VERSACORE ST 145CM (WIRE) ×1 IMPLANT

## 2018-09-26 NOTE — Interval H&P Note (Signed)
History and Physical Interval Note:  09/26/2018 7:44 AM  Catherine Phillips  has presented today for surgery, with the diagnosis of PAD  The various methods of treatment have been discussed with the patient and family. After consideration of risks, benefits and other options for treatment, the patient has consented to  Procedure(s): LOWER EXTREMITY ANGIOGRAPHY (Bilateral) as a surgical intervention .  The patient's history has been reviewed, patient examined, no change in status, stable for surgery.  I have reviewed the patient's chart and labs.  Questions were answered to the patient's satisfaction.     Quay Burow

## 2018-09-26 NOTE — Discharge Instructions (Signed)

## 2018-09-27 MED FILL — Lidocaine HCl Local Preservative Free (PF) Inj 1%: INTRAMUSCULAR | Qty: 30 | Status: AC

## 2018-10-01 ENCOUNTER — Encounter (HOSPITAL_COMMUNITY): Payer: Self-pay | Admitting: Cardiovascular Disease

## 2018-10-03 ENCOUNTER — Encounter (HOSPITAL_COMMUNITY): Payer: Medicare Other

## 2018-10-05 ENCOUNTER — Other Ambulatory Visit: Payer: Self-pay | Admitting: Physician Assistant

## 2018-10-05 DIAGNOSIS — R600 Localized edema: Secondary | ICD-10-CM

## 2018-10-07 ENCOUNTER — Encounter: Payer: Self-pay | Admitting: Physician Assistant

## 2018-10-07 ENCOUNTER — Ambulatory Visit (INDEPENDENT_AMBULATORY_CARE_PROVIDER_SITE_OTHER): Payer: Medicare Other | Admitting: Physician Assistant

## 2018-10-07 VITALS — BP 152/61 | HR 57 | Resp 16 | Wt 128.0 lb

## 2018-10-07 DIAGNOSIS — R233 Spontaneous ecchymoses: Secondary | ICD-10-CM | POA: Diagnosis not present

## 2018-10-07 DIAGNOSIS — M791 Myalgia, unspecified site: Secondary | ICD-10-CM | POA: Diagnosis not present

## 2018-10-07 DIAGNOSIS — R238 Other skin changes: Secondary | ICD-10-CM

## 2018-10-07 NOTE — Progress Notes (Signed)
HPI:                                                                Catherine Phillips is a 73 y.o. female who presents to [place]: [facility] today for bruising  Pleasant 73 yo F with PMH of CKD stage 3, PVD, and chronic respiratory failure reports longstanding history of easy bruising. Two days ago she reports sudden onset bruising of her lower lip after bending over to pick up her shoes. States it looked like she had been punched in the lip. There was no pain or swelling.  Denies epistaxis, gum bleeding, or bruising elsewhere. She currently takes baby asa for PVD.  She has also felt more generalized muscle aches the last few days.   Past Medical History:  Diagnosis Date  . Anxiety 06/24/2012  . COPD (chronic obstructive pulmonary disease) ([place])   . Diverticulosis   . Emphysema 06/24/2012  . Emphysema of lung ([place])   . Essential hypertension 06/24/2012  . History of Left leg claudication 06/24/2012  . Hypertension   . Peripheral vascular disease ([place])   . Renal disorder    Renal Insufficiency R=30%  . Shortness of breath dyspnea    Past Surgical History:  Procedure Laterality Date  . ABDOMINAL AORTAGRAM  12/09/12  . ABDOMINAL AORTAGRAM N/A 12/09/2012   Procedure: ABDOMINAL Maxcine Ham;  Surgeon: Angelia Mould, MD;  Location: [facility] CATH LAB;  Service: Cardiovascular;  Laterality: N/A;  . ABDOMINAL AORTOGRAM N/A 09/26/2018   Procedure: ABDOMINAL AORTOGRAM;  Surgeon: Lorretta Harp, MD;  Location: [facility] CV LAB;  Service: Cardiovascular;  Laterality: N/A;  . LOWER EXTREMITY ANGIOGRAPHY Bilateral 09/26/2018   Procedure: LOWER EXTREMITY ANGIOGRAPHY;  Surgeon: Lorretta Harp, MD;  Location: [facility] CV LAB;  Service: Cardiovascular;  Laterality: Bilateral;  limited to iliacs   Social History   Tobacco Use  . Smoking status: Former Smoker    Packs/day: 1.50    Years: 30.00    Pack years: 45.00    Types: Cigarettes    Last attempt to  quit: 12/30/2009    Years since quitting: 8.7  . Smokeless tobacco: Never Used  Substance Use Topics  . Alcohol use: No   family history includes Cancer in her mother; Deep vein thrombosis in her father; Heart attack in her father; Hyperlipidemia in her father; Hypertension in her father; Stomach cancer in her brother.    ROS: negative except as noted in the HPI  Medications: Current Outpatient Medications  Medication Sig Dispense Refill  . acetaminophen (TYLENOL) 500 MG tablet Take 1,000 mg by mouth every 6 (six) hours as needed (pain).    [name] albuterol (PROVENTIL HFA;VENTOLIN HFA) 108 (90 Base) MCG/ACT inhaler Inhale 1-2 puffs into the lungs every 4 (four) hours as needed for wheezing or shortness of breath. 1 Inhaler 3  . albuterol (PROVENTIL) (2.5 MG/3ML) 0.083% nebulizer solution Take 2.5 mg by nebulization every 4 (four) hours as needed (wheezing/shortness of breath).     . AMBULATORY NON FORMULARY MEDICATION Hypoxia with Room Air at 83 percent. Needs portable oxygen tank 2L O2 continuous and as needed for O2 stats below 90. 1 Device 0  . AMBULATORY NON FORMULARY MEDICATION Medication Name: Battery pack portable oxygen concentrator 1  Product 0  . amLODipine (NORVASC) 10 MG tablet Take 1 tablet (10 mg total) by mouth daily. 90 tablet 3  . aspirin EC 81 MG tablet Take 1 tablet (81 mg total) by mouth daily. 90 tablet 3  . famotidine (PEPCID) 20 MG tablet Take 1 tablet (20 mg total) by mouth at bedtime. 30 tablet 5  . Fluticasone-Umeclidin-Vilant (TRELEGY ELLIPTA) 100-62.5-25 MCG/INH AEPB Inhale 1 puff into the lungs daily. 28 each 5  . furosemide (LASIX) 20 MG tablet TAKE 1 TABLET BY MOUTH DAILY AS NEEDED FOR LOWER EXTREMITY EDEMA 90 tablet 1  . labetalol (NORMODYNE) 200 MG tablet Take 1 tablet (200 mg total) by mouth 2 (two) times daily. 180 tablet 3  . pantoprazole (PROTONIX) 20 MG tablet Take 1 tablet (20 mg total) by mouth daily. 90 tablet 1  . pregabalin (LYRICA) 100 MG capsule TAKE 1  CAPSULE BY MOUTH TWICE A DAY (Patient taking differently: Take 100 mg by mouth 2 (two) times daily. ) 180 capsule 1  . rosuvastatin (CRESTOR) 20 MG tablet Take 20 mg by mouth at bedtime.     No current facility-administered medications for this visit.    Allergies  Allergen Reactions  . Codeine Nausea And Vomiting  . Levaquin [Levofloxacin In D5w] Itching and Rash  . Sulfamethoxazole-Trimethoprim Other (See Comments)    Hyperkalemia (a high level of the electrolyte potassium in the blood)       Objective:  BP (!) 152/61   Pulse (!) 57   Resp 16   Wt 128 lb (58.1 kg)   SpO2 90%   BMI 23.41 kg/m  Gen:  alert, not ill-appearing, no distress, appropriate for age HEENT: head normocephalic without obvious abnormality, conjunctiva and cornea clear, oropharynx clear, no petechiae Pulm: Normal work of breathing, normal phonation Neuro: alert and oriented x 3, no tremor MSK: extremities atraumatic, normal gait and station, 1+ peripheral edema of bilateral ankles Skin: approx 1.5 cm purple ecchymosis of lower lip, no petechiae or purpura on remainder of exam including trunk and extremities    No results found for this or any previous visit (from the past 72 hour(s)). No results found.    Assessment and Plan: 73 y.o. female with   .Diagnoses and all orders for this visit:  Ecchymoses, spontaneous  Myalgia -     CK  Easy bruising -     CBC with Differential/Platelet   Based on history and exam, this non-traumatic ecchymosis is not concerning and likely due to her CKD. Reassurance. Will check platelets today. She should continue baby asa for secondary prevention   Patient education and anticipatory guidance given Patient agrees with treatment plan Follow-up as needed if symptoms worsen or fail to improve  Darlyne Russian PA-C

## 2018-10-07 NOTE — Patient Instructions (Signed)
Contusion    A contusion is a deep bruise. Contusions are the result of a blunt injury to tissues and muscle fibers under the skin. The injury causes bleeding under the skin. The skin overlying the contusion may turn blue, purple, or yellow. Minor injuries will give you a painless contusion, but more severe contusions may stay painful and swollen for a few weeks.  What are the causes?  This condition is usually caused by a blow, trauma, or direct force to an area of the body.  What are the signs or symptoms?  Symptoms of this condition include:   Swelling of the injured area.   Pain and tenderness in the injured area.   Discoloration. The area may have redness and then turn blue, purple, or yellow.  How is this diagnosed?  This condition is diagnosed based on a physical exam and medical history. An X-ray, CT scan, or MRI may be needed to determine if there are any associated injuries, such as broken bones (fractures).  How is this treated?  Specific treatment for this condition depends on what area of the body was injured. In general, the best treatment for a contusion is resting, icing, applying pressure to (compression), and elevating the injured area. This is often called the RICE strategy. Over-the-counter anti-inflammatory medicines may also be recommended for pain control.  Follow these instructions at home:   Rest the injured area.   If directed, apply ice to the injured area:  ? Put ice in a plastic bag.  ? Place a towel between your skin and the bag.  ? Leave the ice on for 20 minutes, 2-3 times per day.   If directed, apply light compression to the injured area using an elastic bandage. Make sure the bandage is not wrapped too tightly. Remove and reapply the bandage as directed by your health care provider.   If possible, raise (elevate) the injured area above the level of your heart while you are sitting or lying down.   Take over-the-counter and prescription medicines only as told by your health  care provider.  Contact a health care provider if:   Your symptoms do not improve after several days of treatment.   Your symptoms get worse.   You have difficulty moving the injured area.  Get help right away if:   You have severe pain.   You have numbness in a hand or foot.   Your hand or foot turns pale or cold.  This information is not intended to replace advice given to you by your health care provider. Make sure you discuss any questions you have with your health care provider.  Document Released: 06/14/2005 Document Revised: 01/13/2016 Document Reviewed: 01/20/2015  Elsevier Interactive Patient Education  2019 [employer]

## 2018-10-08 ENCOUNTER — Encounter: Payer: Self-pay | Admitting: Cardiovascular Disease

## 2018-10-08 ENCOUNTER — Ambulatory Visit (INDEPENDENT_AMBULATORY_CARE_PROVIDER_SITE_OTHER): Payer: Medicare Other | Admitting: Cardiovascular Disease

## 2018-10-08 VITALS — BP 134/78 | HR 70 | Ht 62.0 in | Wt 129.2 lb

## 2018-10-08 DIAGNOSIS — I6523 Occlusion and stenosis of bilateral carotid arteries: Secondary | ICD-10-CM

## 2018-10-08 DIAGNOSIS — I701 Atherosclerosis of renal artery: Secondary | ICD-10-CM

## 2018-10-08 DIAGNOSIS — I739 Peripheral vascular disease, unspecified: Secondary | ICD-10-CM

## 2018-10-08 LAB — CBC WITH DIFFERENTIAL/PLATELET
Absolute Monocytes: 869 cells/uL (ref 200–950)
Basophils Absolute: 106 cells/uL (ref 0–200)
Basophils Relative: 1 %
Eosinophils Absolute: 223 cells/uL (ref 15–500)
Eosinophils Relative: 2.1 %
HCT: 43 % (ref 35.0–45.0)
Hemoglobin: 14.8 g/dL (ref 11.7–15.5)
Lymphs Abs: 3933 cells/uL — ABNORMAL HIGH (ref 850–3900)
MCH: 31.7 pg (ref 27.0–33.0)
MCHC: 34.4 g/dL (ref 32.0–36.0)
MCV: 92.1 fL (ref 80.0–100.0)
MONOS PCT: 8.2 %
MPV: 12.3 fL (ref 7.5–12.5)
Neutro Abs: 5470 cells/uL (ref 1500–7800)
Neutrophils Relative %: 51.6 %
PLATELETS: 230 10*3/uL (ref 140–400)
RBC: 4.67 10*6/uL (ref 3.80–5.10)
RDW: 13.2 % (ref 11.0–15.0)
TOTAL LYMPHOCYTE: 37.1 %
WBC: 10.6 10*3/uL (ref 3.8–10.8)

## 2018-10-08 LAB — CK: Total CK: 62 U/L (ref 29–143)

## 2018-10-08 NOTE — Progress Notes (Signed)
1341 

## 2018-10-08 NOTE — Progress Notes (Signed)
10/08/2018 Catherine Phillips   1946/06/04  [id number]  Primary Physician Donella Stade, PA-C Primary Cardiologist: Lorretta Harp MD Catherine Phillips, Catherine Phillips  HPI:  Catherine Phillips is a 73 y.o.  moderately overweight married Caucasian female mother of 35, grandmother for grandchildren is accompanied by her husband Catherine Phillips today. She was referred by Dr. Stanford Breed for peripheral vascular evaluation because of renal artery stenosis.   I last saw her in the office 09/04/2018.  She does have a history of 100 pack years of tobacco abuse having quit 8 years ago as well history of hypertension hyperlipidemia. Father died of a microinfarction age 74. She is never had a heart attack or stroke. She does have COPD and chronic shortness of breath but denies chest pain. She also has known PAD and claudication in the past week which no longer is a clinical issue. She is had known right renal artery stenosis by duplex ultrasound in the past as well with recent Dopplers suggest a small nonfunctioning right kidney as well as bilateral renal artery stenosis with a left kidney measuring approximately 10 cm. Her blood pressure is controlled on 2 antihypertensive medications. She does see a nephrologist and has creatinines that run in the mid 1 range.  She was noticing increasing lifestyle limiting claudication on the left preventing her from doing meaningful work.  Recent Dopplers performed 08/27/2018 revealed a right ABI of 1.01 and a left ABI 0.65.  She had an occluded left external iliac artery.   She underwent peripheral angiography by myself 09/26/2018 revealing an occluded left external neck artery extending down to the common femoral not amenable to endovascular therapy.  In addition, she had a subtotally occluded right renal artery with 80% left renal artery stenosis.   Current Meds  Medication Sig  . acetaminophen (TYLENOL) 500 MG tablet Take 1,000 mg by mouth every 6 (six) hours as needed (pain).  [name]  albuterol (PROVENTIL HFA;VENTOLIN HFA) 108 (90 Base) MCG/ACT inhaler Inhale 1-2 puffs into the lungs every 4 (four) hours as needed for wheezing or shortness of breath.  [name] albuterol (PROVENTIL) (2.5 MG/3ML) 0.083% nebulizer solution Take 2.5 mg by nebulization every 4 (four) hours as needed (wheezing/shortness of breath).   . AMBULATORY NON FORMULARY MEDICATION Hypoxia with Room Air at 83 percent. Needs portable oxygen tank 2L O2 continuous and as needed for O2 stats below 90.  [name] AMBULATORY NON FORMULARY MEDICATION Medication Name: Battery pack portable oxygen concentrator  . amLODipine (NORVASC) 10 MG tablet Take 1 tablet (10 mg total) by mouth daily.  [name] aspirin EC 81 MG tablet Take 1 tablet (81 mg total) by mouth daily.  . Fluticasone-Umeclidin-Vilant (TRELEGY ELLIPTA) 100-62.5-25 MCG/INH AEPB Inhale 1 puff into the lungs daily.  . furosemide (LASIX) 20 MG tablet TAKE 1 TABLET BY MOUTH DAILY AS NEEDED FOR LOWER EXTREMITY EDEMA  . labetalol (NORMODYNE) 200 MG tablet Take 1 tablet (200 mg total) by mouth 2 (two) times daily.  . pantoprazole (PROTONIX) 20 MG tablet Take 1 tablet (20 mg total) by mouth daily.  . pregabalin (LYRICA) 100 MG capsule TAKE 1 CAPSULE BY MOUTH TWICE A DAY (Patient taking differently: Take 100 mg by mouth 2 (two) times daily. )  . rosuvastatin (CRESTOR) 20 MG tablet Take 20 mg by mouth at bedtime.  . [DISCONTINUED] famotidine (PEPCID) 20 MG tablet Take 1 tablet (20 mg total) by mouth at bedtime.     Allergies  Allergen Reactions  . Codeine Nausea And Vomiting  .  Levaquin [Levofloxacin In D5w] Itching and Rash  . Sulfamethoxazole-Trimethoprim Other (See Comments)    Hyperkalemia (a high level of the electrolyte potassium in the blood)    Social History   Socioeconomic History  . Marital status: Married    Spouse name: Not on file  . Number of children: Not on file  . Years of education: Not on file  . Highest education level: Not on file  Occupational History  .  Not on file  Social Needs  . Financial resource strain: Not on file  . Food insecurity:    Worry: Not on file    Inability: Not on file  . Transportation needs:    Medical: Not on file    Non-medical: Not on file  Tobacco Use  . Smoking status: Former Smoker    Packs/day: 1.50    Years: 30.00    Pack years: 45.00    Types: Cigarettes    Last attempt to quit: 12/30/2009    Years since quitting: 8.7  . Smokeless tobacco: Never Used  Substance and Sexual Activity  . Alcohol use: No  . Drug use: No  . Sexual activity: Not on file  Lifestyle  . Physical activity:    Days per week: Not on file    Minutes per session: Not on file  . Stress: Not on file  Relationships  . Social connections:    Talks on phone: Not on file    Gets together: Not on file    Attends religious service: Not on file    Active member of club or organization: Not on file    Attends meetings of clubs or organizations: Not on file    Relationship status: Not on file  . Intimate partner violence:    Fear of current or ex partner: Not on file    Emotionally abused: Not on file    Physically abused: Not on file    Forced sexual activity: Not on file  Other Topics Concern  . Not on file  Social History Narrative  . Not on file     Review of Systems: General: negative for chills, fever, night sweats or weight changes.  Cardiovascular: negative for chest pain, dyspnea on exertion, edema, orthopnea, palpitations, paroxysmal nocturnal dyspnea or shortness of breath Dermatological: negative for rash Respiratory: negative for cough or wheezing Urologic: negative for hematuria Abdominal: negative for nausea, vomiting, diarrhea, bright red blood per rectum, melena, or hematemesis Neurologic: negative for visual changes, syncope, or dizziness All other systems reviewed and are otherwise negative except as noted above.    Blood pressure 134/78, pulse 70, height 5\' 2"  (1.575 m), weight 129 lb 3.2 oz (58.6 kg).    General appearance: alert and no distress Neck: no adenopathy, no JVD, supple, symmetrical, trachea midline, thyroid not enlarged, symmetric, no tenderness/mass/nodules and Soft right carotid bruit Lungs: clear to auscultation bilaterally Heart: regular rate and rhythm, S1, S2 normal, no murmur, click, rub or gallop Extremities: extremities normal, atraumatic, no cyanosis or edema Pulses: 2+ and symmetric Skin: Skin color, texture, turgor normal. No rashes or lesions Neurologic: Alert and oriented X 3, normal strength and tone. Normal symmetric reflexes. Normal coordination and gait  EKG not performed today  ASSESSMENT AND PLAN:   Intermittent claudication ([place]) Ms. Daily has left hip claudication with recent angiogram which I performed 09/26/2026 revealing an occluded left external iliac artery extending down to the femoral head not amenable to endovascular revascularization.  She would require right to left femorofemoral  crossover grafting which I do not think she is a candidate for given her COPD.  Essential hypertension History of essential hypertension/renal vascular potential blood pressure measured today at 134/78.  She is on amlodipine and labetalol.  She has a known subtotally occluded right renal artery with a small nonfunctioning right kidney and 80% left renal artery stenosis.  Her serum creatinine is in the 1 5 range.  Blood pressures are controlled on 2 antihypertensive medications.  This point, there is no indications to intervene on the left kidney.  She does see nephrologist at least twice a year who may prefer left renal artery intervention for renal preservation.      Lorretta Harp MD FACP,FACC,FAHA, [facility] 10/08/2018 3:18 PM

## 2018-10-08 NOTE — Assessment & Plan Note (Signed)
Catherine Phillips has left hip claudication with recent angiogram which I performed 09/26/2026 revealing an occluded left external iliac artery extending down to the femoral head not amenable to endovascular revascularization.  She would require right to left femorofemoral crossover grafting which I do not think she is a candidate for given her COPD.

## 2018-10-08 NOTE — Patient Instructions (Signed)
Medication Instructions:  Continue current medications  If you need a refill on your cardiac medications before your next appointment, please call your pharmacy.  Labwork: None ordered   Take the provided lab slips with you to the lab for your blood draw.   When you have your labs (blood work) drawn today and your tests are completely normal, you will receive your results only by MyChart Message (if you have MyChart) -OR-  A paper copy in the mail.  If you have any lab test that is abnormal or we need to change your treatment, we will call you to review these results.  Testing/Procedures: Your physician has requested that you have a carotid duplex. This test is an ultrasound of the carotid arteries in your neck. It looks at blood flow through these arteries that supply the brain with blood. Allow one hour for this exam. There are no restrictions or special instructions.  Your physician has requested that you have a renal artery duplex in 6 months. During this test, an ultrasound is used to evaluate blood flow to the kidneys. Allow one hour for this exam. Do not eat after midnight the day before and avoid carbonated beverages. Take your medications as you usually do.   Follow-Up: You will need a follow up appointment in 12 months.  Please call our office 2 months in advance to schedule this appointment.  You may see Dr Gwenlyn Found or one of the following Advanced Practice Providers on your designated Care Team:   Kerin Ransom, PA-C Roby Lofts, [place] . Sande Rives, PA-C    At [facility], you and your health needs are our priority.  As part of our continuing mission to provide you with exceptional heart care, we have created designated Provider Care Teams.  These Care Teams include your primary Cardiologist (physician) and Advanced Practice Providers (APPs -  Physician Assistants and Nurse Practitioners) who all work together to provide you with the care you need, when you need it.  Thank  you for choosing [facility] at [facility]!!

## 2018-10-08 NOTE — Assessment & Plan Note (Signed)
History of essential hypertension/renal vascular potential blood pressure measured today at 134/78.  She is on amlodipine and labetalol.  She has a known subtotally occluded right renal artery with a small nonfunctioning right kidney and 80% left renal artery stenosis.  Her serum creatinine is in the 1 5 range.  Blood pressures are controlled on 2 antihypertensive medications.  This point, there is no indications to intervene on the left kidney.  She does see nephrologist at least twice a year who may prefer left renal artery intervention for renal preservation.

## 2018-10-11 ENCOUNTER — Ambulatory Visit (HOSPITAL_COMMUNITY)
Admission: RE | Admit: 2018-10-11 | Discharge: 2018-10-11 | Disposition: A | Discharge status: Home / Self Care | Payer: Medicare Other | Source: Ambulatory Visit | Attending: Cardiovascular Disease | Admitting: Cardiovascular Disease

## 2018-10-11 DIAGNOSIS — I6523 Occlusion and stenosis of bilateral carotid arteries: Secondary | ICD-10-CM

## 2018-10-15 ENCOUNTER — Other Ambulatory Visit: Payer: Self-pay | Admitting: *Deleted

## 2018-10-15 DIAGNOSIS — I6523 Occlusion and stenosis of bilateral carotid arteries: Secondary | ICD-10-CM

## 2018-10-15 NOTE — Progress Notes (Signed)
vas 

## 2018-10-30 ENCOUNTER — Encounter: Payer: Self-pay | Admitting: Physician Assistant

## 2018-10-30 ENCOUNTER — Ambulatory Visit (INDEPENDENT_AMBULATORY_CARE_PROVIDER_SITE_OTHER): Payer: Medicare Other | Admitting: Physician Assistant

## 2018-10-30 ENCOUNTER — Ambulatory Visit (INDEPENDENT_AMBULATORY_CARE_PROVIDER_SITE_OTHER): Payer: Medicare Other

## 2018-10-30 VITALS — BP 123/69 | HR 61 | Ht 62.0 in | Wt 130.0 lb

## 2018-10-30 DIAGNOSIS — I701 Atherosclerosis of renal artery: Secondary | ICD-10-CM | POA: Diagnosis not present

## 2018-10-30 DIAGNOSIS — M25511 Pain in right shoulder: Secondary | ICD-10-CM

## 2018-10-30 DIAGNOSIS — G8929 Other chronic pain: Secondary | ICD-10-CM | POA: Diagnosis not present

## 2018-10-30 NOTE — Progress Notes (Signed)
Subjective:    Patient ID: Catherine Phillips, female    DOB: [birth year], 73 y.o.   MRN: [id number]  HPI  Patient presents today with right shoulder pain. It has been bothering her since November. She describes the pain as a constant sharp and takes her breath away at times. She rates it a 8/10. She has tried voltaren cream, biofreeze, heating pad and tylenol with no relief. The pain radiates to her deltoid area and back of arm. It rarely will radiate to her neck. She cannot determine if the pain is worse at certain times of the day but does state that it will wake her up in the middle of the night. She endorsed weakness but denies any chest pain, shortness of breath, numbness tingling, redness, swelling, rashes or bruising of the joint. She cannot think of any recent trauma or event in which she may have injured her shoulder but did say she fell onto her right side in the summer.  She has arthritis in her hands but describes this pain as different.   .. Active Ambulatory Problems    Diagnosis Date Noted  . Intermittent claudication ([place]) 06/24/2012  . Essential hypertension 06/24/2012  . Anxiety 06/24/2012  . Hyperlipidemia LDL goal <70 06/26/2012  . Osteoporosis 09/04/2012  . GERD (gastroesophageal reflux disease) 10/04/2012  . Pain in limb 12/04/2012  . Atherosclerosis of native artery of extremity with intermittent claudication ([place]) 12/04/2012  . Peripheral vascular disease ([place]) 01/22/2013  . COPD (chronic obstructive pulmonary disease) with emphysema Gold B 08/22/2013  . Peripheral neuropathy 10/28/2013  . Pulmonary nodules 04/28/2014  . Gastritis 12/03/2014  . Osteoarthritis, hand 04/19/2015  . COPD (chronic obstructive pulmonary disease) ([place]) 05/02/2015  . Leukocytosis 05/02/2015  . SOB (shortness of breath) 05/02/2015  . Dehydration 05/02/2015  . AKI (acute kidney injury) ([place]) 05/02/2015  . Epigastric abdominal pain 04/25/2016  . Hyperkalemia 05/22/2016  . Medication reaction  05/31/2016  . Renal artery stenosis ([place]) 06/21/2016  . Acute respiratory failure with hypoxia ([place]) 12/23/2016  . COPD exacerbation ([place]) 12/23/2016  . Nocturnal hypoxia 03/01/2018  . Chest pain with moderate risk of acute coronary syndrome 03/16/2018  . CKD (chronic kidney disease), stage III ([place]) 03/16/2018  . Precordial chest pain 05/26/2018  . Muscle cramping 05/26/2018  . Emphysema of lung ([place]) 06/05/2018  . Umbilical hernia without obstruction and without gangrene 06/21/2018  . Chronic respiratory failure with hypoxia ([place]) 06/21/2018  . Abdominal distention 06/21/2018  . Diastolic congestive heart failure ([place]) 07/16/2018  . Bilateral leg pain 08/20/2018  . Acute left-sided low back pain without sciatica 08/20/2018  . Acute pain of right shoulder 09/24/2018  . Bilateral leg edema 09/24/2018   Resolved Ambulatory Problems    Diagnosis Date Noted  . COPD exacerbation ([place]) 08/09/2014  . CAP (community acquired pneumonia) 08/09/2014  . COPD exacerbation ([place]) 05/02/2015   Past Medical History:  Diagnosis Date  . Diverticulosis   . Emphysema 06/24/2012  . History of Left leg claudication 06/24/2012  . Hypertension   . Renal disorder   . Shortness of breath dyspnea      Review of Systems  Constitutional: Negative for chills and fever.  HENT: Negative.   Eyes: Negative.   Respiratory: Negative for shortness of breath.   Cardiovascular: Negative for chest pain.  Gastrointestinal: Negative.   Musculoskeletal: Negative for joint swelling and neck stiffness.  Skin: Negative for rash.  Neurological: Positive for weakness. Negative for numbness.       Objective:  Physical Exam Constitutional:      Appearance: Normal appearance.  HENT:     Head: Normocephalic.  Neck:     Musculoskeletal: Normal range of motion.  Cardiovascular:     Rate and Rhythm: Normal rate and regular rhythm.  Pulmonary:     Effort: Pulmonary effort is normal.     Breath sounds: Normal  breath sounds.  Musculoskeletal:     Right shoulder: She exhibits decreased range of motion, tenderness, pain and decreased strength. She exhibits no swelling, no crepitus and no deformity. Abnormal pulses: Neurovasscularly intact      Comments: Positive hawkins, right.    Neurological:     Mental Status: She is alert.           Assessment & Plan:  Catherine KitchenMarland KitchenRusty was seen today for shoulder pain.  Diagnoses and all orders for this visit:  Acute pain of right shoulder -     DG Shoulder Right   Likely shoulder bursitis/impingement syndrome. Present for 4 months. present She has been doing home PT and conservative treatment. She cannot do NSAIDs due to RAS. She would really benefit from injection. She is scared of injections. She agrees to get xray and then follow up with sports medicine for injection. Reassured her of how good they are.   Xray showed. Mild degenerative changes.   Catherine KitchenVernetta Honey PA-C, have reviewed and agree with the above documentation in it's entirety.

## 2018-10-31 NOTE — Progress Notes (Signed)
Call pt: mild arthritis.no other worrisome findings.  I do think you have some impingement syndrome that is causing the pain. I do think an injection would hep. I would make appt with Dr. Darene Lamer or corey for injection.

## 2018-11-17 ENCOUNTER — Other Ambulatory Visit: Payer: Self-pay | Admitting: Physician Assistant

## 2018-11-17 DIAGNOSIS — K2100 Gastro-esophageal reflux disease with esophagitis, without bleeding: Secondary | ICD-10-CM

## 2018-11-17 DIAGNOSIS — R072 Precordial pain: Secondary | ICD-10-CM

## 2018-11-28 ENCOUNTER — Telehealth: Payer: Self-pay

## 2018-11-28 ENCOUNTER — Other Ambulatory Visit: Payer: Self-pay

## 2018-11-28 ENCOUNTER — Ambulatory Visit: Payer: Medicare Other

## 2018-11-28 DIAGNOSIS — M810 Age-related osteoporosis without current pathological fracture: Secondary | ICD-10-CM

## 2018-11-28 LAB — COMPLETE METABOLIC PANEL WITH GFR
AG Ratio: 2.3 (calc) (ref 1.0–2.5)
ALT: 7 U/L (ref 6–29)
AST: 13 U/L (ref 10–35)
Albumin: 4.3 g/dL (ref 3.6–5.1)
Alkaline phosphatase (APISO): 33 U/L — ABNORMAL LOW (ref 37–153)
BUN/Creatinine Ratio: 19 (calc) (ref 6–22)
BUN: 27 mg/dL — ABNORMAL HIGH (ref 7–25)
CHLORIDE: 108 mmol/L (ref 98–110)
CO2: 24 mmol/L (ref 20–32)
Calcium: 9.4 mg/dL (ref 8.6–10.4)
Creat: 1.42 mg/dL — ABNORMAL HIGH (ref 0.60–0.93)
GFR, EST AFRICAN AMERICAN: 43 mL/min/{1.73_m2} — AB (ref >=60)
GFR, Est Non African American: 37 mL/min/{1.73_m2} — ABNORMAL LOW (ref >=60)
Globulin: 1.9 g/dL (calc) (ref 1.9–3.7)
Glucose, Bld: 98 mg/dL (ref 65–99)
Potassium: 5.3 mmol/L (ref 3.5–5.3)
Sodium: 142 mmol/L (ref 135–146)
Total Bilirubin: 0.3 mg/dL (ref 0.2–1.2)
Total Protein: 6.2 g/dL (ref 6.1–8.1)

## 2018-11-28 NOTE — Telephone Encounter (Signed)
Catherine Phillips was scheduled without labs or a PA. I called patient and advised her to go to the lab.  Barnet Pall,  Please call insurance for a PA.   Thanks  She will be scheduled next week.

## 2018-11-29 NOTE — Telephone Encounter (Signed)
Call pt: kidney function stable. Labs stable.

## 2018-12-02 ENCOUNTER — Other Ambulatory Visit: Payer: Self-pay

## 2018-12-02 ENCOUNTER — Ambulatory Visit (INDEPENDENT_AMBULATORY_CARE_PROVIDER_SITE_OTHER): Payer: Medicare Other | Admitting: Physician Assistant

## 2018-12-02 ENCOUNTER — Encounter: Payer: Self-pay | Admitting: Physician Assistant

## 2018-12-02 VITALS — BP 126/79 | HR 66 | Temp 97.7°F | Resp 16 | Ht 62.0 in | Wt 132.0 lb

## 2018-12-02 DIAGNOSIS — I701 Atherosclerosis of renal artery: Secondary | ICD-10-CM

## 2018-12-02 DIAGNOSIS — H6981 Other specified disorders of Eustachian tube, right ear: Secondary | ICD-10-CM

## 2018-12-02 DIAGNOSIS — H6123 Impacted cerumen, bilateral: Secondary | ICD-10-CM | POA: Diagnosis not present

## 2018-12-02 NOTE — Patient Instructions (Signed)

## 2018-12-02 NOTE — Telephone Encounter (Signed)
PCP notified during visit.

## 2018-12-02 NOTE — Progress Notes (Signed)
Subjective:    Patient ID: Catherine Phillips, female    DOB: 1946-03-31, 73 y.o.   MRN: [id number]  HPI Pt is a 73 yo female with chronic respiratory failure, RAS, CKD, PAD who presents to the clinic with right ear pain for last 3-4 days. She complains of both ears feeling "full" but right ear with sharp pains. Not done anything to make better. No fever, chills, SOB(other than normal), body aches, sinus pressure, ST, cough.   .. Active Ambulatory Problems    Diagnosis Date Noted  . Intermittent claudication ([place]) 06/24/2012  . Essential hypertension 06/24/2012  . Anxiety 06/24/2012  . Hyperlipidemia LDL goal <70 06/26/2012  . Osteoporosis 09/04/2012  . GERD (gastroesophageal reflux disease) 10/04/2012  . Pain in limb 12/04/2012  . Atherosclerosis of native artery of extremity with intermittent claudication ([place]) 12/04/2012  . Peripheral vascular disease ([place]) 01/22/2013  . COPD (chronic obstructive pulmonary disease) with emphysema Gold B 08/22/2013  . Peripheral neuropathy 10/28/2013  . Pulmonary nodules 04/28/2014  . Gastritis 12/03/2014  . Osteoarthritis, hand 04/19/2015  . COPD (chronic obstructive pulmonary disease) ([place]) 05/02/2015  . Leukocytosis 05/02/2015  . SOB (shortness of breath) 05/02/2015  . Dehydration 05/02/2015  . AKI (acute kidney injury) ([place]) 05/02/2015  . Epigastric abdominal pain 04/25/2016  . Hyperkalemia 05/22/2016  . Medication reaction 05/31/2016  . Renal artery stenosis ([place]) 06/21/2016  . Acute respiratory failure with hypoxia ([place]) 12/23/2016  . COPD exacerbation ([place]) 12/23/2016  . Nocturnal hypoxia 03/01/2018  . Chest pain with moderate risk of acute coronary syndrome 03/16/2018  . CKD (chronic kidney disease), stage III ([place]) 03/16/2018  . Precordial chest pain 05/26/2018  . Muscle cramping 05/26/2018  . Emphysema of lung ([place]) 06/05/2018  . Umbilical hernia without obstruction and without gangrene 06/21/2018  . Chronic respiratory failure with  hypoxia ([place]) 06/21/2018  . Abdominal distention 06/21/2018  . Diastolic congestive heart failure ([place]) 07/16/2018  . Bilateral leg pain 08/20/2018  . Acute left-sided low back pain without sciatica 08/20/2018  . Acute pain of right shoulder 09/24/2018  . Bilateral leg edema 09/24/2018   Resolved Ambulatory Problems    Diagnosis Date Noted  . COPD exacerbation ([place]) 08/09/2014  . CAP (community acquired pneumonia) 08/09/2014  . COPD exacerbation ([place]) 05/02/2015   Past Medical History:  Diagnosis Date  . Diverticulosis   . Emphysema 06/24/2012  . History of Left leg claudication 06/24/2012  . Hypertension   . Renal disorder   . Shortness of breath dyspnea       Review of Systems See HPI>     Objective:   Physical Exam Vitals signs reviewed.  Constitutional:      Appearance: Normal appearance.  HENT:     Head: Normocephalic.     Right Ear: There is impacted cerumen.     Left Ear: There is impacted cerumen.     Ears:     Comments: Bilateral impacted cerumen.  After irrigation.  TM's clear.     Nose: Nose normal.     Mouth/Throat:     Pharynx: Oropharynx is clear.  Cardiovascular:     Rate and Rhythm: Normal rate and regular rhythm.     Pulses: Normal pulses.  Pulmonary:     Effort: Pulmonary effort is normal.     Breath sounds: Normal breath sounds. No wheezing or rhonchi.  Neurological:     General: No focal deficit present.     Mental Status: She is alert.  Psychiatric:  Mood and Affect: Mood normal.        Behavior: Behavior normal.           Assessment & Plan:  Marland KitchenMarland KitchenCristal was seen today for ear pain.  Diagnoses and all orders for this visit:  ETD (Eustachian tube dysfunction), right  Bilateral impacted cerumen  .Marland KitchenCerumen Removal Template: Indication: Cerumen impaction of the ear(s) Medical necessity statement: On physical examination, cerumen impairs clinically significant portions of the external auditory canal, and tympanic membrane.  Noted obstructive, copious cerumen that cannot be removed without magnification and instrumentations requiring physician skills Consent: Discussed benefits and risks of procedure and verbal consent obtained Procedure: Patient was prepped for the procedure. Utilized an otoscope to assess and take note of the ear canal, the tympanic membrane, and the presence, amount, and placement of the cerumen. Gentle water irrigation and soft plastic curette was utilized to remove cerumen.  Post procedure examination shows cerumen was completely removed. Patient tolerated procedure well. The patient is made aware that they may experience temporary vertigo, temporary hearing loss, and temporary discomfort. If these symptom last for more than 24 hours to call the clinic or proceed to the ED.  Both ears were impacted with cerumen.  This was irrigated today.  After reviewing TM no signs of infection.  I do think she could have some eustachian tube dysfunction in the right ear.  Start with Flonase daily.  Will consider Medrol Dosepak if not improving.  Long conversation about how patient is very high risk for CABG-19.  She must stay at home if at all possible.  She was given call our office if she feels like she needs to come in.  Please wash her hands and allow no sick contacts into her home.  I am very concerned about her wellbeing due to her severe chronic respiratory failure.

## 2018-12-03 ENCOUNTER — Encounter: Payer: Self-pay | Admitting: Physician Assistant

## 2018-12-05 NOTE — Telephone Encounter (Signed)
No PA needed. Patient's spouse checked with insurance.

## 2018-12-06 ENCOUNTER — Other Ambulatory Visit: Payer: Self-pay

## 2018-12-06 ENCOUNTER — Ambulatory Visit (INDEPENDENT_AMBULATORY_CARE_PROVIDER_SITE_OTHER): Payer: Medicare Other | Admitting: Physician Assistant

## 2018-12-06 VITALS — BP 125/51 | HR 62 | Temp 98.6°F

## 2018-12-06 DIAGNOSIS — M810 Age-related osteoporosis without current pathological fracture: Secondary | ICD-10-CM

## 2018-12-06 MED ORDER — DENOSUMAB 60 MG/ML ~~LOC~~ SOSY
60.0000 mg | PREFILLED_SYRINGE | Freq: Once | SUBCUTANEOUS | Status: AC
  Administered 2018-12-06: 60 mg via SUBCUTANEOUS

## 2018-12-06 NOTE — Progress Notes (Signed)
Patient ID: Catherine Phillips, female   DOB: Oct 09, 1945, 73 y.o.   MRN: [id number]  Agree with above plan.

## 2018-12-06 NOTE — Progress Notes (Signed)
Established Patient Office Visit  Subjective:  Patient ID: Catherine Phillips, female    DOB: 03/20/1946  Age: 73 y.o. MRN: [id number]  CC:  Chief Complaint  Patient presents with  . Osteoporosis    HPI Catherine Phillips presents for Prolia injection. Her blood pressure is slightly low today.   Past Medical History:  Diagnosis Date  . Anxiety 06/24/2012  . COPD (chronic obstructive pulmonary disease) ([place])   . Diverticulosis   . Emphysema 06/24/2012  . Emphysema of lung ([place])   . Essential hypertension 06/24/2012  . History of Left leg claudication 06/24/2012  . Hypertension   . Peripheral vascular disease ([place])   . Renal disorder    Renal Insufficiency R=30%  . Shortness of breath dyspnea     Past Surgical History:  Procedure Laterality Date  . ABDOMINAL AORTAGRAM  12/09/12  . ABDOMINAL AORTAGRAM N/A 12/09/2012   Procedure: ABDOMINAL Maxcine Ham;  Surgeon: Angelia Mould, MD;  Location: [facility] CATH LAB;  Service: Cardiovascular;  Laterality: N/A;  . ABDOMINAL AORTOGRAM N/A 09/26/2018   Procedure: ABDOMINAL AORTOGRAM;  Surgeon: Lorretta Harp, MD;  Location: [facility] CV LAB;  Service: Cardiovascular;  Laterality: N/A;  . LOWER EXTREMITY ANGIOGRAPHY Bilateral 09/26/2018   Procedure: LOWER EXTREMITY ANGIOGRAPHY;  Surgeon: Lorretta Harp, MD;  Location: [place] CV LAB;  Service: Cardiovascular;  Laterality: Bilateral;  limited to iliacs    Family History  Problem Relation Age of Onset  . Cancer Mother   . Heart attack Father   . Deep vein thrombosis Father   . Hyperlipidemia Father   . Hypertension Father   . Stomach cancer Brother     Social History   Socioeconomic History  . Marital status: Married    Spouse name: Not on file  . Number of children: Not on file  . Years of education: Not on file  . Highest education level: Not on file  Occupational History  . Not on file  Social Needs  . Financial resource strain: Not on file  . Food insecurity:    Worry:  Not on file    Inability: Not on file  . Transportation needs:    Medical: Not on file    Non-medical: Not on file  Tobacco Use  . Smoking status: Former Smoker    Packs/day: 1.50    Years: 30.00    Pack years: 45.00    Types: Cigarettes    Last attempt to quit: 12/30/2009    Years since quitting: 8.9  . Smokeless tobacco: Never Used  Substance and Sexual Activity  . Alcohol use: No  . Drug use: No  . Sexual activity: Not on file  Lifestyle  . Physical activity:    Days per week: Not on file    Minutes per session: Not on file  . Stress: Not on file  Relationships  . Social connections:    Talks on phone: Not on file    Gets together: Not on file    Attends religious service: Not on file    Active member of club or organization: Not on file    Attends meetings of clubs or organizations: Not on file    Relationship status: Not on file  . Intimate partner violence:    Fear of current or ex partner: Not on file    Emotionally abused: Not on file    Physically abused: Not on file    Forced sexual activity: Not on file  Other Topics Concern  .  Not on file  Social History Narrative  . Not on file    Outpatient Medications Prior to Visit  Medication Sig Dispense Refill  . acetaminophen (TYLENOL) 500 MG tablet Take 1,000 mg by mouth every 6 (six) hours as needed (pain).    [name] albuterol (PROVENTIL HFA;VENTOLIN HFA) 108 (90 Base) MCG/ACT inhaler Inhale 1-2 puffs into the lungs every 4 (four) hours as needed for wheezing or shortness of breath. 1 Inhaler 3  . albuterol (PROVENTIL) (2.5 MG/3ML) 0.083% nebulizer solution Take 2.5 mg by nebulization every 4 (four) hours as needed (wheezing/shortness of breath).     . AMBULATORY NON FORMULARY MEDICATION Hypoxia with Room Air at 83 percent. Needs portable oxygen tank 2L O2 continuous and as needed for O2 stats below 90. 1 Device 0  . AMBULATORY NON FORMULARY MEDICATION Medication Name: Battery pack portable oxygen concentrator 1 Product  0  . amLODipine (NORVASC) 10 MG tablet Take 1 tablet (10 mg total) by mouth daily. 90 tablet 3  . aspirin EC 81 MG tablet Take 1 tablet (81 mg total) by mouth daily. 90 tablet 3  . Fluticasone-Umeclidin-Vilant (TRELEGY ELLIPTA) 100-62.5-25 MCG/INH AEPB Inhale 1 puff into the lungs daily. 28 each 5  . furosemide (LASIX) 20 MG tablet TAKE 1 TABLET BY MOUTH DAILY AS NEEDED FOR LOWER EXTREMITY EDEMA 90 tablet 1  . labetalol (NORMODYNE) 200 MG tablet Take 1 tablet (200 mg total) by mouth 2 (two) times daily. 180 tablet 3  . pantoprazole (PROTONIX) 20 MG tablet TAKE 1 TABLET BY MOUTH EVERY DAY 90 tablet 1  . pregabalin (LYRICA) 100 MG capsule TAKE 1 CAPSULE BY MOUTH TWICE A DAY (Patient taking differently: Take 100 mg by mouth 2 (two) times daily. ) 180 capsule 1  . rosuvastatin (CRESTOR) 20 MG tablet Take 20 mg by mouth at bedtime.     No facility-administered medications prior to visit.     Allergies  Allergen Reactions  . Codeine Nausea And Vomiting  . Levaquin [Levofloxacin In D5w] Itching and Rash  . Sulfamethoxazole-Trimethoprim Other (See Comments)    Hyperkalemia (a high level of the electrolyte potassium in the blood)    ROS Review of Systems    Objective:    Physical Exam  BP (!) 125/51   Pulse 62   Temp 98.6 F (37 C)  Wt Readings from Last 3 Encounters:  12/02/18 132 lb (59.9 kg)  10/30/18 130 lb (59 kg)  10/08/18 129 lb 3.2 oz (58.6 kg)     Health Maintenance Due  Topic Date Due  . COLONOSCOPY  06/12/2018    There are no preventive care reminders to display for this patient.  No results found for: TSH Lab Results  Component Value Date   WBC 10.6 10/07/2018   HGB 14.8 10/07/2018   HCT 43.0 10/07/2018   MCV 92.1 10/07/2018   PLT 230 10/07/2018   Lab Results  Component Value Date   NA 142 11/28/2018   K 5.3 11/28/2018   CO2 24 11/28/2018   GLUCOSE 98 11/28/2018   BUN 27 (H) 11/28/2018   CREATININE 1.42 (H) 11/28/2018   BILITOT 0.3 11/28/2018    ALKPHOS 38 (L) 09/09/2018   AST 13 11/28/2018   ALT 7 11/28/2018   PROT 6.2 11/28/2018   ALBUMIN 4.2 09/09/2018   CALCIUM 9.4 11/28/2018   ANIONGAP 9 03/15/2018   GFR 35.99 (L) 07/19/2018   Lab Results  Component Value Date   CHOL 153 09/09/2018   Lab Results  Component Value  Date   HDL 46 09/09/2018   Lab Results  Component Value Date   LDLCALC 67 09/09/2018   Lab Results  Component Value Date   TRIG 199 (H) 09/09/2018   Lab Results  Component Value Date   CHOLHDL 3.3 09/09/2018   Lab Results  Component Value Date   HGBA1C 6.4 (H) 03/16/2018      Assessment & Plan:  Osteoporosis - Patient tolerated injection well without complications. Patient advised to call her cardiologist about her blood pressure reading and to call us back with blood pressure readings from home this afternoon.   Problem List Items Addressed This Visit    None      No orders of the defined types were placed in this encounter.   Follow-up: No follow-ups on file.    Lavell Luster, [place]

## 2018-12-10 ENCOUNTER — Telehealth: Payer: Self-pay

## 2018-12-10 NOTE — Telephone Encounter (Signed)
Lynora called the after hours [place]. She reported a blood pressure of 140/62.

## 2018-12-10 NOTE — Telephone Encounter (Signed)
Not as low as office visit. Thanks for update.

## 2018-12-19 ENCOUNTER — Telehealth: Payer: Self-pay | Admitting: Neurology

## 2018-12-19 NOTE — Telephone Encounter (Signed)
Called and spoke with Bethena Roys at [employer] and she updated the insurance and this will go to approval.

## 2018-12-19 NOTE — Telephone Encounter (Addendum)
Mary from [employer] called and left VM stating she was missing patient's new Medicare number for Prolia. She is asking for a call back at [phone] with this information. Please advise.

## 2018-12-19 NOTE — Telephone Encounter (Signed)
Upon review, this is regarding PA for Prolia. Routing.

## 2018-12-31 ENCOUNTER — Telehealth: Payer: Self-pay

## 2018-12-31 MED ORDER — FLUCONAZOLE 150 MG PO TABS: 150.0000 mg | ORAL_TABLET | Freq: Every day | ORAL | 0 refills | Status: DC

## 2018-12-31 NOTE — Telephone Encounter (Signed)
Patient called stating that she has not been doing well with rinsing her mouth after using inhaler and she is getting thrush. Pt requesting RX for TABLETS to be sent to pharmacy to treat, does not want liquid.   Please advise

## 2018-12-31 NOTE — Telephone Encounter (Signed)
Pt advised.

## 2018-12-31 NOTE — Telephone Encounter (Signed)
Sent diflucan once daily for 2 weeks for thrush.

## 2019-01-09 DIAGNOSIS — N183 Chronic kidney disease, stage 3 (moderate): Secondary | ICD-10-CM | POA: Diagnosis not present

## 2019-01-27 ENCOUNTER — Ambulatory Visit (INDEPENDENT_AMBULATORY_CARE_PROVIDER_SITE_OTHER): Payer: Medicare Other | Admitting: Physician Assistant

## 2019-01-27 ENCOUNTER — Other Ambulatory Visit: Payer: Self-pay

## 2019-01-27 ENCOUNTER — Ambulatory Visit (INDEPENDENT_AMBULATORY_CARE_PROVIDER_SITE_OTHER): Payer: Medicare Other

## 2019-01-27 ENCOUNTER — Encounter: Payer: Self-pay | Admitting: Physician Assistant

## 2019-01-27 VITALS — BP 139/50 | HR 60 | Temp 98.4°F | Ht 62.0 in | Wt 133.0 lb

## 2019-01-27 DIAGNOSIS — M549 Dorsalgia, unspecified: Secondary | ICD-10-CM | POA: Diagnosis not present

## 2019-01-27 DIAGNOSIS — J441 Chronic obstructive pulmonary disease with (acute) exacerbation: Secondary | ICD-10-CM | POA: Diagnosis not present

## 2019-01-27 DIAGNOSIS — R0602 Shortness of breath: Secondary | ICD-10-CM | POA: Diagnosis not present

## 2019-01-27 DIAGNOSIS — J432 Centrilobular emphysema: Secondary | ICD-10-CM

## 2019-01-27 DIAGNOSIS — J9611 Chronic respiratory failure with hypoxia: Secondary | ICD-10-CM | POA: Diagnosis not present

## 2019-01-27 DIAGNOSIS — I701 Atherosclerosis of renal artery: Secondary | ICD-10-CM | POA: Diagnosis not present

## 2019-01-27 MED ORDER — PREDNISONE 20 MG PO TABS: ORAL_TABLET | ORAL | 0 refills | Status: DC

## 2019-01-27 NOTE — Progress Notes (Signed)
Subjective:    Patient ID: Catherine Phillips, female    DOB: 1946/06/07, 73 y.o.   MRN: [id number]  HPI  Pt is a 73 yo female with chronic respiratory hypoxia, COPD, PAD, CKD who presents to the clinic with worsening SOB for the last week or so. She usually can go about 7-8 hours a day without O2 but for the last week she has been using continuous O2 at 2L. She has only been able to go about 10 minutes without O2. At times she feels like she needs to increase L of O2. No fever, chills, wheezing, productive cough, body aches, chills, sinus pressure. No sick contacts. She admits she was trying to cut out trelegy due to cost and didn't take it for 4 weeks.she started back 5 days ago. She does have some left mid pain with deep breaths. Denies any urinary symptoms. She is using nebulizer a few times a week.  .. Active Ambulatory Problems    Diagnosis Date Noted  . Intermittent claudication ([place]) 06/24/2012  . Essential hypertension 06/24/2012  . Anxiety 06/24/2012  . Hyperlipidemia LDL goal <70 06/26/2012  . Osteoporosis 09/04/2012  . GERD (gastroesophageal reflux disease) 10/04/2012  . Pain in limb 12/04/2012  . Atherosclerosis of native artery of extremity with intermittent claudication ([place]) 12/04/2012  . Peripheral vascular disease ([place]) 01/22/2013  . COPD (chronic obstructive pulmonary disease) with emphysema Gold B 08/22/2013  . Peripheral neuropathy 10/28/2013  . Pulmonary nodules 04/28/2014  . Gastritis 12/03/2014  . Osteoarthritis, hand 04/19/2015  . COPD (chronic obstructive pulmonary disease) ([place]) 05/02/2015  . Leukocytosis 05/02/2015  . SOB (shortness of breath) 05/02/2015  . Dehydration 05/02/2015  . AKI (acute kidney injury) ([place]) 05/02/2015  . Epigastric abdominal pain 04/25/2016  . Hyperkalemia 05/22/2016  . Medication reaction 05/31/2016  . Renal artery stenosis ([place]) 06/21/2016  . Acute respiratory failure with hypoxia ([place]) 12/23/2016  . COPD exacerbation ([place])  12/23/2016  . Nocturnal hypoxia 03/01/2018  . Chest pain with moderate risk of acute coronary syndrome 03/16/2018  . CKD (chronic kidney disease), stage III ([place]) 03/16/2018  . Precordial chest pain 05/26/2018  . Muscle cramping 05/26/2018  . Emphysema of lung ([place]) 06/05/2018  . Umbilical hernia without obstruction and without gangrene 06/21/2018  . Chronic respiratory failure with hypoxia ([place]) 06/21/2018  . Abdominal distention 06/21/2018  . Diastolic congestive heart failure ([place]) 07/16/2018  . Bilateral leg pain 08/20/2018  . Acute left-sided low back pain without sciatica 08/20/2018  . Acute pain of right shoulder 09/24/2018  . Bilateral leg edema 09/24/2018   Resolved Ambulatory Problems    Diagnosis Date Noted  . COPD exacerbation ([place]) 08/09/2014  . CAP (community acquired pneumonia) 08/09/2014  . COPD exacerbation ([place]) 05/02/2015   Past Medical History:  Diagnosis Date  . Diverticulosis   . Emphysema 06/24/2012  . History of Left leg claudication 06/24/2012  . Hypertension   . Renal disorder   . Shortness of breath dyspnea        Review of Systems See HPI.     Objective:   Physical Exam Vitals signs reviewed.  Constitutional:      Appearance: Normal appearance.  HENT:     Head: Normocephalic.     Right Ear: Tympanic membrane normal.     Left Ear: Tympanic membrane normal.     Nose: Nose normal.     Mouth/Throat:     Mouth: Mucous membranes are moist.     Pharynx: Oropharynx is clear.  Eyes:  Extraocular Movements: Extraocular movements intact.     Conjunctiva/sclera: Conjunctivae normal.     Pupils: Pupils are equal, round, and reactive to light.  Neck:     Musculoskeletal: Normal range of motion.  Cardiovascular:     Rate and Rhythm: Normal rate.     Pulses: Normal pulses.  Pulmonary:     Comments: Decreased effort.  Distance breath sounds.  No wheezing. No rhonchi.  Abdominal:     General: Bowel sounds are normal.  Lymphadenopathy:      Cervical: No cervical adenopathy.  Neurological:     General: No focal deficit present.     Mental Status: She is alert and oriented to person, place, and time.  Psychiatric:        Mood and Affect: Mood normal.        Behavior: Behavior normal.           Assessment & Plan:  Marland KitchenMarland KitchenPetula was seen today for other.  Diagnoses and all orders for this visit:  Chronic respiratory failure with hypoxia (HCC) -     DG Chest 2 View -     predniSONE (DELTASONE) 20 MG tablet; Take 3 tablets for 3 days, take two tablets for 3 days, take one tablets for 3 days, 1/2 tablet for 4 days.  Centrilobular emphysema (HCC) -     DG Chest 2 View -     predniSONE (DELTASONE) 20 MG tablet; Take 3 tablets for 3 days, take two tablets for 3 days, take one tablets for 3 days, 1/2 tablet for 4 days.  Mid back pain on left side -     DG Chest 2 View  SOB (shortness of breath) -     DG Chest 2 View -     predniSONE (DELTASONE) 20 MG tablet; Take 3 tablets for 3 days, take two tablets for 3 days, take one tablets for 3 days, 1/2 tablet for 4 days.  COPD exacerbation (HCC) -     DG Chest 2 View -     predniSONE (DELTASONE) 20 MG tablet; Take 3 tablets for 3 days, take two tablets for 3 days, take one tablets for 3 days, 1/2 tablet for 4 days.  CXR confirmed no pneumonia. Likely breathing difficulties due to stopping trelegy pt aware NOT do to that again. Prednisone taper added. Pt aware can use nebulizer during flare 2-3 times a day as needed and then back off again. Continue on O2 at 2L. Stick to 2L and not increasing if possible. Discussed pulmonary rehab breathing with patient.    With ambulating on 2L of O2 86 percent pulse Ox.  With rest at 2L of 02 90 percent pulse ox.  Filled out letter she is being managed for O2   .Marland KitchenSpent 30 minutes with patient and greater than 50 percent of visit spent counseling patient regarding treatment plan.

## 2019-01-27 NOTE — Progress Notes (Signed)
No pneumonia. Start prednisone.

## 2019-01-28 ENCOUNTER — Encounter: Payer: Self-pay | Admitting: Physician Assistant

## 2019-02-03 ENCOUNTER — Telehealth: Payer: Self-pay | Admitting: Neurology

## 2019-02-03 DIAGNOSIS — J9601 Acute respiratory failure with hypoxia: Secondary | ICD-10-CM

## 2019-02-03 DIAGNOSIS — J441 Chronic obstructive pulmonary disease with (acute) exacerbation: Secondary | ICD-10-CM

## 2019-02-03 MED ORDER — ALBUTEROL SULFATE (2.5 MG/3ML) 0.083% IN NEBU: 2.5000 mg | INHALATION_SOLUTION | RESPIRATORY_TRACT | 0 refills | Status: AC | PRN

## 2019-02-03 MED ORDER — ALBUTEROL SULFATE HFA 108 (90 BASE) MCG/ACT IN AERS: 1.0000 | INHALATION_SPRAY | RESPIRATORY_TRACT | 0 refills | Status: AC | PRN

## 2019-02-03 NOTE — Telephone Encounter (Signed)
Patient left vm that she needed albuteral inhaler and nebulizer refill sent to pharmacy. Refills sent.

## 2019-03-03 ENCOUNTER — Encounter: Payer: Self-pay | Admitting: Physician Assistant

## 2019-03-03 ENCOUNTER — Ambulatory Visit (INDEPENDENT_AMBULATORY_CARE_PROVIDER_SITE_OTHER): Payer: Medicare Other | Admitting: Physician Assistant

## 2019-03-03 VITALS — BP 135/55 | HR 54 | Temp 98.2°F | Ht 62.0 in | Wt 131.0 lb

## 2019-03-03 DIAGNOSIS — J9611 Chronic respiratory failure with hypoxia: Secondary | ICD-10-CM

## 2019-03-03 DIAGNOSIS — K429 Umbilical hernia without obstruction or gangrene: Secondary | ICD-10-CM

## 2019-03-03 DIAGNOSIS — G629 Polyneuropathy, unspecified: Secondary | ICD-10-CM

## 2019-03-03 DIAGNOSIS — I701 Atherosclerosis of renal artery: Secondary | ICD-10-CM

## 2019-03-03 MED ORDER — PREGABALIN 100 MG PO CAPS: 100.0000 mg | ORAL_CAPSULE | Freq: Two times a day (BID) | ORAL | 1 refills | Status: AC

## 2019-03-03 NOTE — Progress Notes (Addendum)
Established Patient Office Visit  Subjective:  Patient ID: Catherine Phillips, female    DOB: 02/11/1946  Age: 73 y.o. MRN: [id number]  CC:  Chief Complaint  Patient presents with  . Hernia    HPI Catherine Phillips presents for follow up on her umbilical hernia. States the hernia has grown since first presenting 6 months ago. Also, over the last 2 weeks the hernia has  become mildly painful to palpation. The pain has began to interfere with her ability to get dressed, specifically when pulling up her pants. States she can still push the hernia back in easily but this maneuver is now uncomfortable.   Denies abdominal pain outside of the hernia's location. Denies N/V, bloating, or changes in bowel habits. Patient states she has gained a few pounds since beginning prednisone.   Past Medical History:  Diagnosis Date  . Anxiety 06/24/2012  . COPD (chronic obstructive pulmonary disease) ([place])   . Diverticulosis   . Emphysema 06/24/2012  . Emphysema of lung ([place])   . Essential hypertension 06/24/2012  . History of Left leg claudication 06/24/2012  . Hypertension   . Peripheral vascular disease ([place])   . Renal disorder    Renal Insufficiency R=30%  . Shortness of breath dyspnea     Past Surgical History:  Procedure Laterality Date  . ABDOMINAL AORTAGRAM  12/09/12  . ABDOMINAL AORTAGRAM N/A 12/09/2012   Procedure: ABDOMINAL Maxcine Ham;  Surgeon: Angelia Mould, MD;  Location: [facility] CATH LAB;  Service: Cardiovascular;  Laterality: N/A;  . ABDOMINAL AORTOGRAM N/A 09/26/2018   Procedure: ABDOMINAL AORTOGRAM;  Surgeon: Lorretta Harp, MD;  Location: [place] CV LAB;  Service: Cardiovascular;  Laterality: N/A;  . LOWER EXTREMITY ANGIOGRAPHY Bilateral 09/26/2018   Procedure: LOWER EXTREMITY ANGIOGRAPHY;  Surgeon: Lorretta Harp, MD;  Location: [facility] CV LAB;  Service: Cardiovascular;  Laterality: Bilateral;  limited to iliacs    Family History  Problem Relation Age of Onset  . Cancer  Mother   . Heart attack Father   . Deep vein thrombosis Father   . Hyperlipidemia Father   . Hypertension Father   . Stomach cancer Brother     Social History   Socioeconomic History  . Marital status: Married    Spouse name: Not on file  . Number of children: Not on file  . Years of education: Not on file  . Highest education level: Not on file  Occupational History  . Not on file  Social Needs  . Financial resource strain: Not on file  . Food insecurity    Worry: Not on file    Inability: Not on file  . Transportation needs    Medical: Not on file    Non-medical: Not on file  Tobacco Use  . Smoking status: Former Smoker    Packs/day: 1.50    Years: 30.00    Pack years: 45.00    Types: Cigarettes    Quit date: 12/30/2009    Years since quitting: 9.1  . Smokeless tobacco: Never Used  Substance and Sexual Activity  . Alcohol use: No  . Drug use: No  . Sexual activity: Not on file  Lifestyle  . Physical activity    Days per week: Not on file    Minutes per session: Not on file  . Stress: Not on file  Relationships  . Social Herbalist on phone: Not on file    Gets together: Not on file    Attends religious  service: Not on file    Active member of club or organization: Not on file    Attends meetings of clubs or organizations: Not on file    Relationship status: Not on file  . Intimate partner violence    Fear of current or ex partner: Not on file    Emotionally abused: Not on file    Physically abused: Not on file    Forced sexual activity: Not on file  Other Topics Concern  . Not on file  Social History Narrative  . Not on file    Outpatient Medications Prior to Visit  Medication Sig Dispense Refill  . acetaminophen (TYLENOL) 500 MG tablet Take 1,000 mg by mouth every 6 (six) hours as needed (pain).    [name] albuterol (PROVENTIL) (2.5 MG/3ML) 0.083% nebulizer solution Take 3 mLs (2.5 mg total) by nebulization every 4 (four) hours as needed  (wheezing/shortness of breath). 75 mL 0  . albuterol (VENTOLIN HFA) 108 (90 Base) MCG/ACT inhaler Inhale 1-2 puffs into the lungs every 4 (four) hours as needed for wheezing or shortness of breath. 1 Inhaler 0  . AMBULATORY NON FORMULARY MEDICATION Hypoxia with Room Air at 83 percent. Needs portable oxygen tank 2L O2 continuous and as needed for O2 stats below 90. 1 Device 0  . AMBULATORY NON FORMULARY MEDICATION Medication Name: Battery pack portable oxygen concentrator 1 Product 0  . amLODipine (NORVASC) 10 MG tablet Take 1 tablet (10 mg total) by mouth daily. 90 tablet 3  . aspirin EC 81 MG tablet Take 1 tablet (81 mg total) by mouth daily. 90 tablet 3  . Fluticasone-Umeclidin-Vilant (TRELEGY ELLIPTA) 100-62.5-25 MCG/INH AEPB Inhale 1 puff into the lungs daily. 28 each 5  . furosemide (LASIX) 20 MG tablet TAKE 1 TABLET BY MOUTH DAILY AS NEEDED FOR LOWER EXTREMITY EDEMA 90 tablet 1  . labetalol (NORMODYNE) 200 MG tablet Take 1 tablet (200 mg total) by mouth 2 (two) times daily. 180 tablet 3  . pantoprazole (PROTONIX) 20 MG tablet TAKE 1 TABLET BY MOUTH EVERY DAY 90 tablet 1  . rosuvastatin (CRESTOR) 20 MG tablet Take 20 mg by mouth at bedtime.    . pregabalin (LYRICA) 100 MG capsule TAKE 1 CAPSULE BY MOUTH TWICE A DAY 180 capsule 1  . predniSONE (DELTASONE) 20 MG tablet Take 3 tablets for 3 days, take two tablets for 3 days, take one tablets for 3 days, 1/2 tablet for 4 days. 21 tablet 0   No facility-administered medications prior to visit.     Allergies  Allergen Reactions  . Codeine Nausea And Vomiting  . Levaquin [Levofloxacin In D5w] Itching and Rash  . Sulfamethoxazole-Trimethoprim Other (See Comments)    Hyperkalemia (a high level of the electrolyte potassium in the blood)    ROS Review of Systems  Constitutional: Positive for appetite change. Negative for fatigue and fever.       Increased appetite since taking prednisone.   Gastrointestinal: Negative for abdominal distention,  blood in stool, constipation, diarrhea and nausea.       Abdominal pain over umbilical hernia.       Objective:    Physical Exam  Constitutional: She appears well-developed and well-nourished.  Abdominal: Soft. Bowel sounds are normal. She exhibits distension, ascites and mass. There is abdominal tenderness in the periumbilical area. There is no rebound and no guarding. A hernia is present.  Umbilical hernia approximately 2cm by 2cm that is tender to light and deep palpation. Also increasing discomfort upon lifting her  head off the bed while laying supine. It is fully reducible.   Skin: Skin is warm and dry.    BP (!) 135/55   Pulse (!) 54   Temp 98.2 F (36.8 C) (Oral)   Ht 5\' 2"  (1.575 m)   Wt 131 lb (59.4 kg)   SpO2 92%   BMI 23.96 kg/m  Wt Readings from Last 3 Encounters:  03/03/19 131 lb (59.4 kg)  01/27/19 133 lb (60.3 kg)  12/02/18 132 lb (59.9 kg)     Health Maintenance Due  Topic Date Due  . COLONOSCOPY  06/12/2018    There are no preventive care reminders to display for this patient.  No results found for: TSH Lab Results  Component Value Date   WBC 10.6 10/07/2018   HGB 14.8 10/07/2018   HCT 43.0 10/07/2018   MCV 92.1 10/07/2018   PLT 230 10/07/2018   Lab Results  Component Value Date   NA 142 11/28/2018   K 5.3 11/28/2018   CO2 24 11/28/2018   GLUCOSE 98 11/28/2018   BUN 27 (H) 11/28/2018   CREATININE 1.42 (H) 11/28/2018   BILITOT 0.3 11/28/2018   ALKPHOS 38 (L) 09/09/2018   AST 13 11/28/2018   ALT 7 11/28/2018   PROT 6.2 11/28/2018   ALBUMIN 4.2 09/09/2018   CALCIUM 9.4 11/28/2018   ANIONGAP 9 03/15/2018   GFR 35.99 (L) 07/19/2018   Lab Results  Component Value Date   CHOL 153 09/09/2018   Lab Results  Component Value Date   HDL 46 09/09/2018   Lab Results  Component Value Date   LDLCALC 67 09/09/2018   Lab Results  Component Value Date   TRIG 199 (H) 09/09/2018   Lab Results  Component Value Date   CHOLHDL 3.3 09/09/2018    Lab Results  Component Value Date   HGBA1C 6.4 (H) 03/16/2018      Assessment & Plan:   Problem List Items Addressed This Visit      Unprioritized   Peripheral neuropathy (Chronic)   Relevant Medications   pregabalin (LYRICA) 100 MG capsule   Umbilical hernia without obstruction and without gangrene - Primary   Relevant Orders   Ambulatory referral to General Surgery   Chronic respiratory failure with hypoxia (HCC)     Umbilical hernia, reducible  - Patient is having increased pain over and directly around her umbilical hernia. Today we reviewed hernia stages inclduing signs of incarceration and strangulation. We also discussed red flags including when to seek urgent and emergent care. Discussed surgery and sent a consult to general surgery. I am concerned about her high risk for surgery with Chronic hypoxia. HO given. Pt aware that weight loss could help with hernia discomfort. Encouraged calorie reduction and walking(with oxygen).   Her O2 stats without O2 today were 84 percent on arrival but with rest went to 92 percent. Strongly encouraged use of continual O2.   Refilled lyrica today.   Meds ordered this encounter  Medications  . pregabalin (LYRICA) 100 MG capsule    Sig: Take 1 capsule (100 mg total) by mouth 2 (two) times daily.    Dispense:  180 capsule    Refill:  1    This request is for a new prescription for a controlled substance as required by Federal/State law.    Follow-up: Return if symptoms worsen or fail to improve.    Iran Planas, PA-C   ..I, [facility] PA-C, have reviewed and agree with the above documentation in  it's entirety.

## 2019-03-03 NOTE — Patient Instructions (Signed)
Umbilical Hernia, Adult  A hernia is a bulge of tissue that pushes through an opening between muscles. An umbilical hernia happens in the abdomen, near the belly button (umbilicus). The hernia may contain tissues from the small intestine, large intestine, or fatty tissue covering the intestines (omentum). Umbilical hernias in adults tend to get worse over time, and they require surgical treatment. There are several types of umbilical hernias. You may have:  A hernia located just above or below the umbilicus (indirect hernia). This is the most common type of umbilical hernia in adults.  A hernia that forms through an opening formed by the umbilicus (direct hernia).  A hernia that comes and goes (reducible hernia). A reducible hernia may be visible only when you strain, lift something heavy, or cough. This type of hernia can be pushed back into the abdomen (reduced).  A hernia that traps abdominal tissue inside the hernia (incarcerated hernia). This type of hernia cannot be reduced.  A hernia that cuts off blood flow to the tissues inside the hernia (strangulated hernia). The tissues can start to die if this happens. This type of hernia requires emergency treatment. What are the causes? An umbilical hernia happens when tissue inside the abdomen presses on a weak area of the abdominal muscles. What increases the risk? You may have a greater risk of this condition if you:  Are obese.  Have had several pregnancies.  Have a buildup of fluid inside your abdomen (ascites).  Have had surgery that weakens the abdominal muscles. What are the signs or symptoms? The main symptom of this condition is a painless bulge at or near the belly button. A reducible hernia may be visible only when you strain, lift something heavy, or cough. Other symptoms may include:  Dull pain.  A feeling of pressure. Symptoms of a strangulated hernia may include:  Pain that gets increasingly worse.  Nausea and  vomiting.  Pain when pressing on the hernia.  Skin over the hernia becoming red or purple.  Constipation.  Blood in the stool. How is this diagnosed? This condition may be diagnosed based on:  A physical exam. You may be asked to cough or strain while standing. These actions increase the pressure inside your abdomen and force the hernia through the opening in your muscles. Your health care provider may try to reduce the hernia by pressing on it.  Your symptoms and medical history. How is this treated? Surgery is the only treatment for an umbilical hernia. Surgery for a strangulated hernia is done as soon as possible. If you have a small hernia that is not incarcerated, you may need to lose weight before having surgery. Follow these instructions at home:  Lose weight, if told by your health care provider.  Do not try to push the hernia back in.  Watch your hernia for any changes in color or size. Tell your health care provider if any changes occur.  You may need to avoid activities that increase pressure on your hernia.  Do not lift anything that is heavier than 10 lb (4.5 kg) until your health care provider says that this is safe.  Take over-the-counter and prescription medicines only as told by your health care provider.  Keep all follow-up visits as told by your health care provider. This is important. Contact a health care provider if:  Your hernia gets larger.  Your hernia becomes painful. Get help right away if:  You develop sudden, severe pain near the area of your hernia.    You have pain as well as nausea or vomiting.  You have pain and the skin over your hernia changes color.  You develop a fever. This information is not intended to replace advice given to you by your health care provider. Make sure you discuss any questions you have with your health care provider. Document Released: 02/04/2016 Document Revised: 10/17/2017 Document Reviewed: 03/05/2017 Elsevier  Interactive Patient Education  2019 Elsevier Inc.  

## 2019-03-11 ENCOUNTER — Ambulatory Visit (INDEPENDENT_AMBULATORY_CARE_PROVIDER_SITE_OTHER): Payer: Medicare Other | Admitting: Physician Assistant

## 2019-03-11 ENCOUNTER — Encounter: Payer: Self-pay | Admitting: Physician Assistant

## 2019-03-11 VITALS — BP 97/77 | HR 54 | Temp 97.8°F | Ht 62.0 in | Wt 132.0 lb

## 2019-03-11 DIAGNOSIS — I10 Essential (primary) hypertension: Secondary | ICD-10-CM | POA: Diagnosis not present

## 2019-03-11 DIAGNOSIS — I701 Atherosclerosis of renal artery: Secondary | ICD-10-CM | POA: Diagnosis not present

## 2019-03-11 DIAGNOSIS — D229 Melanocytic nevi, unspecified: Secondary | ICD-10-CM | POA: Insufficient documentation

## 2019-03-11 DIAGNOSIS — J9611 Chronic respiratory failure with hypoxia: Secondary | ICD-10-CM | POA: Diagnosis not present

## 2019-03-11 DIAGNOSIS — R42 Dizziness and giddiness: Secondary | ICD-10-CM | POA: Diagnosis not present

## 2019-03-11 DIAGNOSIS — D2239 Melanocytic nevi of other parts of face: Secondary | ICD-10-CM | POA: Diagnosis not present

## 2019-03-11 NOTE — Patient Instructions (Addendum)
Decrease norvasc to 5mg  daily(1/2 tablet).      Orthostatic Hypotension Blood pressure is a measurement of how strongly, or weakly, your blood is pressing against the walls of your arteries. Orthostatic hypotension is a sudden drop in blood pressure that happens when you quickly change positions, such as when you get up from sitting or lying down. Arteries are blood vessels that carry blood from your heart throughout your body. When blood pressure is too low, you may not get enough blood to your brain or to the rest of your organs. This can cause weakness, light-headedness, rapid heartbeat, and fainting. This can last for just a few seconds or for up to a few minutes. Orthostatic hypotension is usually not a serious problem. However, if it happens frequently or gets worse, it may be a sign of something more serious. What are the causes? This condition may be caused by:  Sudden changes in posture, such as standing up quickly after you have been sitting or lying down.  Blood loss.  Loss of body fluids (dehydration).  Heart problems.  Hormone (endocrine) problems.  Pregnancy.  Severe infection.  Lack of certain nutrients.  Severe allergic reactions (anaphylaxis).  Certain medicines, such as blood pressure medicine or medicines that make the body lose excess fluids (diuretics). Sometimes, this condition can be caused by not taking medicine as directed, such as taking too much of a certain medicine. What increases the risk? The following factors may make you more likely to develop this condition:  Age. Risk increases as you get older.  Conditions that affect the heart or the central nervous system.  Taking certain medicines, such as blood pressure medicine or diuretics.  Being pregnant. What are the signs or symptoms? Symptoms of this condition may include:  Weakness.  Light-headedness.  Dizziness.  Blurred vision.  Fatigue.  Rapid heartbeat.  Fainting, in severe  cases. How is this diagnosed? This condition is diagnosed based on:  Your medical history.  Your symptoms.  Your blood pressure measurement. Your health care provider will check your blood pressure when you are: ? Lying down. ? Sitting. ? Standing. A blood pressure reading is recorded as two numbers, such as "120 over 80" (or 120/80). The first ("top") number is called the systolic pressure. It is a measure of the pressure in your arteries as your heart beats. The second ("bottom") number is called the diastolic pressure. It is a measure of the pressure in your arteries when your heart relaxes between beats. Blood pressure is measured in a unit called mm Hg. Healthy blood pressure for most adults is 120/80. If your blood pressure is below 90/60, you may be diagnosed with hypotension. Other information or tests that may be used to diagnose orthostatic hypotension include:  Your other vital signs, such as your heart rate and temperature.  Blood tests.  Tilt table test. For this test, you will be safely secured to a table that moves you from a lying position to an upright position. Your heart rhythm and blood pressure will be monitored during the test. How is this treated? This condition may be treated by:  Changing your diet. This may involve eating more salt (sodium) or drinking more water.  Taking medicines to raise your blood pressure.  Changing the dosage of certain medicines you are taking that might be lowering your blood pressure.  Wearing compression stockings. These stockings help to prevent blood clots and reduce swelling in your legs. In some cases, you may need to go  to the hospital for:  Fluid replacement. This means you will receive fluids through an IV.  Blood replacement. This means you will receive donated blood through an IV (transfusion).  Treating an infection or heart problems, if this applies.  Monitoring. You may need to be monitored while medicines that you  are taking wear off. Follow these instructions at home: Eating and drinking   Drink enough fluid to keep your urine pale yellow.  Eat a healthy diet, and follow instructions from your health care provider about eating or drinking restrictions. A healthy diet includes: ? Fresh fruits and vegetables. ? Whole grains. ? Lean meats. ? Low-fat dairy products.  Eat extra salt only as directed. Do not add extra salt to your diet unless your health care provider told you to do that.  Eat frequent, small meals.  Avoid standing up suddenly after eating. Medicines  Take over-the-counter and prescription medicines only as told by your health care provider. ? Follow instructions from your health care provider about changing the dosage of your current medicines, if this applies. ? Do not stop or adjust any of your medicines on your own. General instructions   Wear compression stockings as told by your health care provider.  Get up slowly from lying down or sitting positions. This gives your blood pressure a chance to adjust.  Avoid hot showers and excessive heat as directed by your health care provider.  Return to your normal activities as told by your health care provider. Ask your health care provider what activities are safe for you.  Do not use any products that contain nicotine or tobacco, such as cigarettes, e-cigarettes, and chewing tobacco. If you need help quitting, ask your health care provider.  Keep all follow-up visits as told by your health care provider. This is important. Contact a health care provider if you:  Vomit.  Have diarrhea.  Have a fever for more than 2-3 days.  Feel more thirsty than usual.  Feel weak and tired. Get help right away if you:  Have chest pain.  Have a fast or irregular heartbeat.  Develop numbness in any part of your body.  Cannot move your arms or your legs.  Have trouble speaking.  Become sweaty or feel  light-headed.  Faint.  Feel short of breath.  Have trouble staying awake.  Feel confused. Summary  Orthostatic hypotension is a sudden drop in blood pressure that happens when you quickly change positions.  Orthostatic hypotension is usually not a serious problem.  It is diagnosed by having your blood pressure taken lying down, sitting, and then standing.  It may be treated by changing your diet or adjusting your medicines. This information is not intended to replace advice given to you by your health care provider. Make sure you discuss any questions you have with your health care provider. Document Released: 08/25/2002 Document Revised: 02/28/2018 Document Reviewed: 02/28/2018 Elsevier Interactive Patient Education  [employer].

## 2019-03-11 NOTE — Addendum Note (Signed)
Addended by: Donella Stade on: 03/11/2019 04:45 PM   Modules accepted: Level of Service

## 2019-03-11 NOTE — Progress Notes (Addendum)
Established Patient Office Visit  Subjective:  Patient ID: Catherine Phillips, female    DOB: 07/19/1946  Age: 73 y.o. MRN: [id number]  CC:  Chief Complaint  Patient presents with  . Nevus    HPI Catherine Phillips presents requesting removal of a nevi located on the right side of her face adjacent to the corner of her lips. States she had the mole removed 8 years ago for cosmetic reasons but over the last year it has reappeared and began to grow again. Says it is currently half the size it was when it was removed 8 years ago but desires to have it removed before it gets any bigger. Denies any pain, itching, or discomfort over the area. She just does not like the way it looks and irritating to her.  Also states having dizziness with standing over the last month. Denies any headaches, pre-syncope events, or falls. She is wondering if this new sensation is associated with any of her medications or recent low blood pressure readings.   Past Medical History:  Diagnosis Date  . Anxiety 06/24/2012  . COPD (chronic obstructive pulmonary disease) ([place])   . Diverticulosis   . Emphysema 06/24/2012  . Emphysema of lung ([place])   . Essential hypertension 06/24/2012  . History of Left leg claudication 06/24/2012  . Hypertension   . Peripheral vascular disease ([place])   . Renal disorder    Renal Insufficiency R=30%  . Shortness of breath dyspnea     Past Surgical History:  Procedure Laterality Date  . ABDOMINAL AORTAGRAM  12/09/12  . ABDOMINAL AORTAGRAM N/A 12/09/2012   Procedure: ABDOMINAL Maxcine Ham;  Surgeon: Angelia Mould, MD;  Location: [facility] CATH LAB;  Service: Cardiovascular;  Laterality: N/A;  . ABDOMINAL AORTOGRAM N/A 09/26/2018   Procedure: ABDOMINAL AORTOGRAM;  Surgeon: Lorretta Harp, MD;  Location: [facility] CV LAB;  Service: Cardiovascular;  Laterality: N/A;  . LOWER EXTREMITY ANGIOGRAPHY Bilateral 09/26/2018   Procedure: LOWER EXTREMITY ANGIOGRAPHY;  Surgeon: Lorretta Harp, MD;   Location: [facility] CV LAB;  Service: Cardiovascular;  Laterality: Bilateral;  limited to iliacs    Family History  Problem Relation Age of Onset  . Cancer Mother   . Heart attack Father   . Deep vein thrombosis Father   . Hyperlipidemia Father   . Hypertension Father   . Stomach cancer Brother     Social History   Socioeconomic History  . Marital status: Married    Spouse name: Not on file  . Number of children: Not on file  . Years of education: Not on file  . Highest education level: Not on file  Occupational History  . Not on file  Social Needs  . Financial resource strain: Not on file  . Food insecurity    Worry: Not on file    Inability: Not on file  . Transportation needs    Medical: Not on file    Non-medical: Not on file  Tobacco Use  . Smoking status: Former Smoker    Packs/day: 1.50    Years: 30.00    Pack years: 45.00    Types: Cigarettes    Quit date: 12/30/2009    Years since quitting: 9.2  . Smokeless tobacco: Never Used  Substance and Sexual Activity  . Alcohol use: No  . Drug use: No  . Sexual activity: Not on file  Lifestyle  . Physical activity    Days per week: Not on file    Minutes per session: Not  on file  . Stress: Not on file  Relationships  . Social Herbalist on phone: Not on file    Gets together: Not on file    Attends religious service: Not on file    Active member of club or organization: Not on file    Attends meetings of clubs or organizations: Not on file    Relationship status: Not on file  . Intimate partner violence    Fear of current or ex partner: Not on file    Emotionally abused: Not on file    Physically abused: Not on file    Forced sexual activity: Not on file  Other Topics Concern  . Not on file  Social History Narrative  . Not on file    Outpatient Medications Prior to Visit  Medication Sig Dispense Refill  . acetaminophen (TYLENOL) 500 MG tablet Take 1,000 mg by mouth every 6 (six) hours as  needed (pain).    [name] albuterol (PROVENTIL) (2.5 MG/3ML) 0.083% nebulizer solution Take 3 mLs (2.5 mg total) by nebulization every 4 (four) hours as needed (wheezing/shortness of breath). 75 mL 0  . albuterol (VENTOLIN HFA) 108 (90 Base) MCG/ACT inhaler Inhale 1-2 puffs into the lungs every 4 (four) hours as needed for wheezing or shortness of breath. 1 Inhaler 0  . AMBULATORY NON FORMULARY MEDICATION Hypoxia with Room Air at 83 percent. Needs portable oxygen tank 2L O2 continuous and as needed for O2 stats below 90. 1 Device 0  . AMBULATORY NON FORMULARY MEDICATION Medication Name: Battery pack portable oxygen concentrator 1 Product 0  . amLODipine (NORVASC) 10 MG tablet Take 1 tablet (10 mg total) by mouth daily. 90 tablet 3  . aspirin EC 81 MG tablet Take 1 tablet (81 mg total) by mouth daily. 90 tablet 3  . Fluticasone-Umeclidin-Vilant (TRELEGY ELLIPTA) 100-62.5-25 MCG/INH AEPB Inhale 1 puff into the lungs daily. 28 each 5  . furosemide (LASIX) 20 MG tablet TAKE 1 TABLET BY MOUTH DAILY AS NEEDED FOR LOWER EXTREMITY EDEMA 90 tablet 1  . labetalol (NORMODYNE) 200 MG tablet Take 1 tablet (200 mg total) by mouth 2 (two) times daily. 180 tablet 3  . pantoprazole (PROTONIX) 20 MG tablet TAKE 1 TABLET BY MOUTH EVERY DAY 90 tablet 1  . pregabalin (LYRICA) 100 MG capsule Take 1 capsule (100 mg total) by mouth 2 (two) times daily. 180 capsule 1  . rosuvastatin (CRESTOR) 20 MG tablet Take 20 mg by mouth at bedtime.     No facility-administered medications prior to visit.     Allergies  Allergen Reactions  . Codeine Nausea And Vomiting  . Levaquin [Levofloxacin In D5w] Itching and Rash  . Sulfamethoxazole-Trimethoprim Other (See Comments)    Hyperkalemia (a high level of the electrolyte potassium in the blood)    ROS Review of Systems  Constitutional: Negative for fatigue.  Eyes: Negative for visual disturbance.  Respiratory: Negative for cough and shortness of breath.   Cardiovascular: Negative  for chest pain, palpitations and leg swelling.  Skin: Negative for color change and rash.  Neurological: Positive for dizziness. Negative for weakness, light-headedness and headaches.      Objective:    Physical Exam  Constitutional: She is oriented to person, place, and time. She appears well-developed and well-nourished.  HENT:  Head: Normocephalic and atraumatic.  Eyes: Conjunctivae and EOM are normal.  Cardiovascular: Normal rate and regular rhythm.  Pulmonary/Chest: Breath sounds normal. No respiratory distress. She has no wheezes. She has no  rales. She exhibits no tenderness.  Neurological: She is alert and oriented to person, place, and time.  Skin: Skin is warm and dry.   54mm by 26mm circular raised pigmented nevus of the left side of face adjacent to lip.   BP 97/77   Pulse (!) 54   Temp 97.8 F (36.6 C) (Oral)   Ht 5\' 2"  (1.575 m)   Wt 132 lb (59.9 kg)   SpO2 (!) 89%   BMI 24.14 kg/m  Wt Readings from Last 3 Encounters:  03/11/19 132 lb (59.9 kg)  03/03/19 131 lb (59.4 kg)  01/27/19 133 lb (60.3 kg)     Health Maintenance Due  Topic Date Due  . COLONOSCOPY  06/12/2018    There are no preventive care reminders to display for this patient.  No results found for: TSH Lab Results  Component Value Date   WBC 10.6 10/07/2018   HGB 14.8 10/07/2018   HCT 43.0 10/07/2018   MCV 92.1 10/07/2018   PLT 230 10/07/2018   Lab Results  Component Value Date   NA 142 11/28/2018   K 5.3 11/28/2018   CO2 24 11/28/2018   GLUCOSE 98 11/28/2018   BUN 27 (H) 11/28/2018   CREATININE 1.42 (H) 11/28/2018   BILITOT 0.3 11/28/2018   ALKPHOS 38 (L) 09/09/2018   AST 13 11/28/2018   ALT 7 11/28/2018   PROT 6.2 11/28/2018   ALBUMIN 4.2 09/09/2018   CALCIUM 9.4 11/28/2018   ANIONGAP 9 03/15/2018   GFR 35.99 (L) 07/19/2018   Lab Results  Component Value Date   CHOL 153 09/09/2018   Lab Results  Component Value Date   HDL 46 09/09/2018   Lab Results  Component Value  Date   LDLCALC 67 09/09/2018   Lab Results  Component Value Date   TRIG 199 (H) 09/09/2018   Lab Results  Component Value Date   CHOLHDL 3.3 09/09/2018   Lab Results  Component Value Date   HGBA1C 6.4 (H) 03/16/2018      Assessment & Plan:   Nevi  - Area does not appear suspicious. Cryotherapy performed. Instructed to follow up if the area changes/reappears.   Orthostatic hypotension/Essential HTN - BP: 97/77 today. Discussed orthostatic hypotension causes and management options. Decrease Norvasc from 10 mg daily to 5 mg daily (1/2 tablet). Instructed patient to check BP readings at home and follow up sooner if needed. Reccommended increasing water intake. Will continue to monitor this and make changes to current regimen as needed.   Chronic hypoxia Pt is stable in office. She is not on O2. She is in no distress despite her O2 stats of 84-89percent. Encouraged patient to wear O2 when ambulating.   Cryotherapy Procedure Note  Pre-operative Diagnosis: benign nevus  Post-operative Diagnosis: same  Locations: left side of face adjacent to lip  Indications: irritation  Procedure Details  History of allergy to iodine: no. Pacemaker? no.  Patient informed of risks (permanent scarring, infection, light or dark discoloration, bleeding, infection, weakness, numbness and recurrence of the lesion) and benefits of the procedure and verbal informed consent obtained.  The areas are treated with liquid nitrogen therapy, frozen until ice ball extended 2 mm beyond lesion, allowed to thaw, and treated again. The patient tolerated procedure well.  The patient was instructed on post-op care, warned that there may be blister formation, redness and pain. Recommend OTC analgesia as needed for pain.  Condition: Stable  Complications: none.  Plan: 1. Instructed to keep the area dry  and covered for 24-48h and clean thereafter. 2. Warning signs of infection were reviewed.   3. Recommended  that the patient use OTC acetaminophen as needed for pain.     No orders of the defined types were placed in this encounter.   Follow-up: Return in about 3 months (around 06/11/2019), or if symptoms worsen or fail to improve.    Iran Planas, PA-C   ..I, [facility] PA-C, have reviewed and agree with the above documentation in it's entirety.

## 2019-03-24 DIAGNOSIS — K429 Umbilical hernia without obstruction or gangrene: Secondary | ICD-10-CM | POA: Diagnosis not present

## 2019-04-09 ENCOUNTER — Ambulatory Visit (HOSPITAL_COMMUNITY)
Admission: RE | Admit: 2019-04-09 | Discharge: 2019-04-09 | Disposition: A | Discharge status: Home / Self Care | Payer: Medicare Other | Source: Ambulatory Visit | Attending: Cardiovascular Disease | Admitting: Cardiovascular Disease

## 2019-04-09 ENCOUNTER — Other Ambulatory Visit: Payer: Self-pay

## 2019-04-09 DIAGNOSIS — I701 Atherosclerosis of renal artery: Secondary | ICD-10-CM | POA: Diagnosis not present

## 2019-04-14 ENCOUNTER — Telehealth: Payer: Self-pay | Admitting: Cardiovascular Disease

## 2019-04-14 NOTE — Telephone Encounter (Signed)
Called pt and informed that result note for renal artery ultrasound not yet available to review with pt, but pt will be contacted when they are. Pt verbalized understanding

## 2019-04-14 NOTE — Telephone Encounter (Signed)
Patient calling for results to her duplex.

## 2019-04-16 ENCOUNTER — Ambulatory Visit (INDEPENDENT_AMBULATORY_CARE_PROVIDER_SITE_OTHER): Payer: Medicare Other | Admitting: Physician Assistant

## 2019-04-16 ENCOUNTER — Encounter: Payer: Self-pay | Admitting: Physician Assistant

## 2019-04-16 VITALS — BP 136/64 | Temp 97.1°F | Ht 62.0 in | Wt 131.0 lb

## 2019-04-16 DIAGNOSIS — Z20828 Contact with and (suspected) exposure to other viral communicable diseases: Secondary | ICD-10-CM | POA: Diagnosis not present

## 2019-04-16 DIAGNOSIS — Z20822 Contact with and (suspected) exposure to covid-19: Secondary | ICD-10-CM

## 2019-04-16 DIAGNOSIS — I701 Atherosclerosis of renal artery: Secondary | ICD-10-CM

## 2019-04-16 NOTE — Progress Notes (Signed)
Patient ID: Catherine Phillips, female   DOB: [birth year], 73 y.o.   MRN: [id number] .Marland KitchenVirtual Visit via Video Note  I connected with Karma Greaser on 04/16/19 at 10:50 AM EDT by a video enabled telemedicine application and verified that I am speaking with the correct person using two identifiers.  Location: Patient: home Provider: clinic   I discussed the limitations of evaluation and management by telemedicine and the availability of in person appointments. The patient expressed understanding and agreed to proceed.  History of Present Illness: Pt is a 73 yo female with severe COPD with hypoxia, intermittient claudication, PVD, RAS who would be high risk for covid complications calls into clinic after direct covid exposure. Saturday night she went to an outside dining experience with friends. The day after her friend tested positive for COVID. She is currently asymptomatic. No SOB, chest tightness, cough(other than normal), no fever, chills, body aches, or GI symptoms.   .. Active Ambulatory Problems    Diagnosis Date Noted  . Intermittent claudication ([place]) 06/24/2012  . Essential hypertension 06/24/2012  . Anxiety 06/24/2012  . Hyperlipidemia LDL goal <70 06/26/2012  . Osteoporosis 09/04/2012  . GERD (gastroesophageal reflux disease) 10/04/2012  . Pain in limb 12/04/2012  . Atherosclerosis of native artery of extremity with intermittent claudication ([place]) 12/04/2012  . Peripheral vascular disease ([place]) 01/22/2013  . COPD (chronic obstructive pulmonary disease) with emphysema Gold B 08/22/2013  . Peripheral neuropathy 10/28/2013  . Pulmonary nodules 04/28/2014  . Gastritis 12/03/2014  . Osteoarthritis, hand 04/19/2015  . COPD (chronic obstructive pulmonary disease) ([place]) 05/02/2015  . Leukocytosis 05/02/2015  . SOB (shortness of breath) 05/02/2015  . Dehydration 05/02/2015  . AKI (acute kidney injury) ([place]) 05/02/2015  . Epigastric abdominal pain 04/25/2016  . Hyperkalemia 05/22/2016   . Medication reaction 05/31/2016  . Renal artery stenosis ([place]) 06/21/2016  . Acute respiratory failure with hypoxia ([place]) 12/23/2016  . COPD exacerbation ([place]) 12/23/2016  . Nocturnal hypoxia 03/01/2018  . Chest pain with moderate risk of acute coronary syndrome 03/16/2018  . CKD (chronic kidney disease), stage III ([place]) 03/16/2018  . Precordial chest pain 05/26/2018  . Muscle cramping 05/26/2018  . Emphysema of lung ([place]) 06/05/2018  . Umbilical hernia without obstruction and without gangrene 06/21/2018  . Chronic respiratory failure with hypoxia ([place]) 06/21/2018  . Abdominal distention 06/21/2018  . Diastolic congestive heart failure ([place]) 07/16/2018  . Bilateral leg pain 08/20/2018  . Acute left-sided low back pain without sciatica 08/20/2018  . Acute pain of right shoulder 09/24/2018  . Bilateral leg edema 09/24/2018  . Pigmented nevus 03/11/2019  . Orthostatic dizziness 03/11/2019   Resolved Ambulatory Problems    Diagnosis Date Noted  . COPD exacerbation ([place]) 08/09/2014  . CAP (community acquired pneumonia) 08/09/2014  . COPD exacerbation ([place]) 05/02/2015   Past Medical History:  Diagnosis Date  . Diverticulosis   . Emphysema 06/24/2012  . History of Left leg claudication 06/24/2012  . Hypertension   . Renal disorder   . Shortness of breath dyspnea    Reviewed med, problem, allergy list.    Observations/Objective: No acute distress. No coughing on phone call.  No evidence of labored breathing.   .. Today's Vitals   04/16/19 1013  BP: 136/64  Temp: (!) 97.1 F (36.2 C)  TempSrc: Oral  SpO2: 91%  Weight: 131 lb (59.4 kg)  Height: 5\' 2"  (1.575 m)   Body mass index is 23.96 kg/m.   Assessment and Plan: Marland KitchenMarland KitchenIndia was seen today for advice only.  Diagnoses and all orders for this visit:  Close Exposure to Covid-19 Virus   Pt is high risk for complications. Needs testing. Pt declined [place] location. She is going to call [facility] in town. Self isolate until  test results come back and/or 14 days from direct contact with positive patient. Call office with any COVID symptoms.   Follow Up Instructions:    I discussed the assessment and treatment plan with the patient. The patient was provided an opportunity to ask questions and all were answered. The patient agreed with the plan and demonstrated an understanding of the instructions.   The patient was advised to call back or seek an in-person evaluation if the symptoms worsen or if the condition fails to improve as anticipated.   Iran Planas, PA-C

## 2019-04-16 NOTE — Progress Notes (Deleted)
Went out to dinner Saturday with friends, ate outside.  One tested positive for Covid.  No current symptoms.

## 2019-04-18 ENCOUNTER — Telehealth: Payer: Self-pay | Admitting: Neurology

## 2019-04-18 DIAGNOSIS — U071 COVID-19: Secondary | ICD-10-CM | POA: Diagnosis not present

## 2019-04-18 DIAGNOSIS — Z20828 Contact with and (suspected) exposure to other viral communicable diseases: Secondary | ICD-10-CM | POA: Diagnosis not present

## 2019-04-18 NOTE — Telephone Encounter (Signed)
Patient seen 04/16/2019 for visit post Covid exposure.  She is being tested today at 5:30 pm.  She woke up with headache and sore throat. Was told to call if she developed symptoms. Please advise.

## 2019-04-18 NOTE — Telephone Encounter (Signed)
Patient's husband made aware of recommendations.

## 2019-04-18 NOTE — Telephone Encounter (Signed)
Ok yes please get tested. Rest and hydrate. Can take tylenol for ST and headache. Ok to use albuterol/nebulizer every 2-4 hours as needed. Low threshold for going to ED if pulse ox dropping below 88 and if short of breath.

## 2019-04-22 ENCOUNTER — Encounter: Payer: Self-pay | Admitting: Physician Assistant

## 2019-04-22 ENCOUNTER — Telehealth: Payer: Self-pay | Admitting: Neurology

## 2019-04-22 ENCOUNTER — Ambulatory Visit (INDEPENDENT_AMBULATORY_CARE_PROVIDER_SITE_OTHER): Payer: Medicare Other | Admitting: Physician Assistant

## 2019-04-22 VITALS — Ht 62.0 in | Wt 131.0 lb

## 2019-04-22 DIAGNOSIS — J9611 Chronic respiratory failure with hypoxia: Secondary | ICD-10-CM

## 2019-04-22 DIAGNOSIS — U071 COVID-19: Secondary | ICD-10-CM | POA: Diagnosis not present

## 2019-04-22 DIAGNOSIS — I701 Atherosclerosis of renal artery: Secondary | ICD-10-CM | POA: Diagnosis not present

## 2019-04-22 DIAGNOSIS — J431 Panlobular emphysema: Secondary | ICD-10-CM

## 2019-04-22 DIAGNOSIS — I739 Peripheral vascular disease, unspecified: Secondary | ICD-10-CM | POA: Diagnosis not present

## 2019-04-22 DIAGNOSIS — I10 Essential (primary) hypertension: Secondary | ICD-10-CM

## 2019-04-22 MED ORDER — AZITHROMYCIN 250 MG PO TABS: ORAL_TABLET | ORAL | 0 refills | Status: AC

## 2019-04-22 NOTE — Telephone Encounter (Signed)
Patient's husband left vm that patient did test positive for Covid.   She is very sick. Sore throat. Has already been instructed to rest, hydrate, take tylenol for symptoms, use albuterol/nebulizer every 2-4 hours PRN and to go to ED if pulse ox below 88. He wanted to know if anything else to do.

## 2019-04-22 NOTE — Progress Notes (Signed)
..Virtual Visit via Telephone Note  I connected with Catherine Phillips on 04/22/19 at 10:50 AM EDT by telephone and verified that I am speaking with the correct person using two identifiers.  Location: Patient: home Provider: clinic   I discussed the limitations, risks, security and privacy concerns of performing an evaluation and management service by telephone and the availability of in person appointments. I also discussed with the patient that there may be a patient responsible charge related to this service. The patient expressed understanding and agreed to proceed.   History of Present Illness: Pt is a 73 yo female with severe COPD and chronic hypoxia, PAD, RAS who is very high risk for covid complications. She tested positive for COVID. She had a direct exposure last Saturday 10 days ago with a friend. She c/o ST, leg weakness, SOB, headache, cough, fatigue. Currently pulse ox is 90 percent on 2L of O2. She is taking tylenol for pain. She is wondering if there is anything else she can do. Cough is becoming more and more productive. She has a low grade fever at times.   .. Active Ambulatory Problems    Diagnosis Date Noted  . Intermittent claudication ([place]) 06/24/2012  . Essential hypertension 06/24/2012  . Anxiety 06/24/2012  . Hyperlipidemia LDL goal <70 06/26/2012  . Osteoporosis 09/04/2012  . GERD (gastroesophageal reflux disease) 10/04/2012  . Pain in limb 12/04/2012  . Atherosclerosis of native artery of extremity with intermittent claudication ([place]) 12/04/2012  . Peripheral vascular disease ([place]) 01/22/2013  . COPD (chronic obstructive pulmonary disease) with emphysema Gold B 08/22/2013  . Peripheral neuropathy 10/28/2013  . Pulmonary nodules 04/28/2014  . Gastritis 12/03/2014  . Osteoarthritis, hand 04/19/2015  . COPD (chronic obstructive pulmonary disease) ([place]) 05/02/2015  . Leukocytosis 05/02/2015  . SOB (shortness of breath) 05/02/2015  . Dehydration 05/02/2015  . AKI  (acute kidney injury) ([place]) 05/02/2015  . Epigastric abdominal pain 04/25/2016  . Hyperkalemia 05/22/2016  . Medication reaction 05/31/2016  . Renal artery stenosis ([place]) 06/21/2016  . Acute respiratory failure with hypoxia ([place]) 12/23/2016  . COPD exacerbation ([place]) 12/23/2016  . Nocturnal hypoxia 03/01/2018  . Chest pain with moderate risk of acute coronary syndrome 03/16/2018  . CKD (chronic kidney disease), stage III ([place]) 03/16/2018  . Precordial chest pain 05/26/2018  . Muscle cramping 05/26/2018  . Emphysema of lung ([place]) 06/05/2018  . Umbilical hernia without obstruction and without gangrene 06/21/2018  . Chronic respiratory failure with hypoxia ([place]) 06/21/2018  . Abdominal distention 06/21/2018  . Diastolic congestive heart failure ([place]) 07/16/2018  . Bilateral leg pain 08/20/2018  . Acute left-sided low back pain without sciatica 08/20/2018  . Acute pain of right shoulder 09/24/2018  . Bilateral leg edema 09/24/2018  . Pigmented nevus 03/11/2019  . Orthostatic dizziness 03/11/2019  . COVID-19 virus infection 04/22/2019   Resolved Ambulatory Problems    Diagnosis Date Noted  . COPD exacerbation ([place]) 08/09/2014  . CAP (community acquired pneumonia) 08/09/2014  . COPD exacerbation ([place]) 05/02/2015   Past Medical History:  Diagnosis Date  . Diverticulosis   . Emphysema 06/24/2012  . History of Left leg claudication 06/24/2012  . Hypertension   . Renal disorder   . Shortness of breath dyspnea    Reviewed med, allergy, problem list.     Observations/Objective: No video but sounds like she is having some trouble breathing.  Productive cough on exam.  Sounds weak.   .. Today's Vitals   04/22/19 0944  SpO2: 90%  Weight: 131 lb (  59.4 kg)  Height: 5\' 2"  (1.575 m)   Body mass index is 23.96 kg/m.   Assessment and Plan: Marland KitchenMarland KitchenNiyati was seen today for generalized body aches.  Diagnoses and all orders for this visit:  COVID-19 virus infection -      azithromycin (ZITHROMAX) 250 MG tablet; Take 2 tablets now and then one tablet for 4 days.  Essential hypertension  Peripheral vascular disease ([place])  Renal artery stenosis ([facility])  Chronic respiratory failure with hypoxia ([facility])  Panlobular emphysema ([place])  Pt appears pretty stable at this time. Husband is close by and monitoring wife symptoms.   Pt is certainly high risk for complications of COVID. Discussed if pulse ox dropping below 88 percent, having significant problems breathing or laying down, uncontrolled progressive weakness go to ED. Continue to use tylenol, nebulizer every 2-4 hours, stay on O2 continuously. Will add zpak for coverage of secondary bacterial infection. Discuss laying in prone position for as long as can tolerate throughout the day.   Pt and husband are self isolating and aware of when to take to ED.   Follow up as needed.   Follow Up Instructions:    I discussed the assessment and treatment plan with the patient. The patient was provided an opportunity to ask questions and all were answered. The patient agreed with the plan and demonstrated an understanding of the instructions.   The patient was advised to call back or seek an in-person evaluation if the symptoms worsen or if the condition fails to improve as anticipated.  I provided 10 minutes of non-face-to-face time during this encounter.   Iran Planas, PA-C

## 2019-04-22 NOTE — Telephone Encounter (Signed)
Has virtual visit today. I went ahead and called them.

## 2019-04-26 DIAGNOSIS — J439 Emphysema, unspecified: Secondary | ICD-10-CM | POA: Diagnosis present

## 2019-04-26 DIAGNOSIS — I130 Hypertensive heart and chronic kidney disease with heart failure and stage 1 through stage 4 chronic kidney disease, or unspecified chronic kidney disease: Secondary | ICD-10-CM | POA: Diagnosis present

## 2019-04-26 DIAGNOSIS — J90 Pleural effusion, not elsewhere classified: Secondary | ICD-10-CM | POA: Diagnosis not present

## 2019-04-26 DIAGNOSIS — G9340 Encephalopathy, unspecified: Secondary | ICD-10-CM | POA: Diagnosis not present

## 2019-04-26 DIAGNOSIS — Z888 Allergy status to other drugs, medicaments and biological substances status: Secondary | ICD-10-CM | POA: Diagnosis not present

## 2019-04-26 DIAGNOSIS — N390 Urinary tract infection, site not specified: Secondary | ICD-10-CM | POA: Diagnosis present

## 2019-04-26 DIAGNOSIS — A4150 Gram-negative sepsis, unspecified: Secondary | ICD-10-CM | POA: Diagnosis not present

## 2019-04-26 DIAGNOSIS — J189 Pneumonia, unspecified organism: Secondary | ICD-10-CM | POA: Diagnosis not present

## 2019-04-26 DIAGNOSIS — Z87448 Personal history of other diseases of urinary system: Secondary | ICD-10-CM | POA: Diagnosis not present

## 2019-04-26 DIAGNOSIS — Z882 Allergy status to sulfonamides status: Secondary | ICD-10-CM | POA: Diagnosis not present

## 2019-04-26 DIAGNOSIS — J1289 Other viral pneumonia: Secondary | ICD-10-CM | POA: Diagnosis present

## 2019-04-26 DIAGNOSIS — I739 Peripheral vascular disease, unspecified: Secondary | ICD-10-CM | POA: Diagnosis present

## 2019-04-26 DIAGNOSIS — J9621 Acute and chronic respiratory failure with hypoxia: Secondary | ICD-10-CM | POA: Diagnosis not present

## 2019-04-26 DIAGNOSIS — U071 COVID-19: Secondary | ICD-10-CM | POA: Diagnosis not present

## 2019-04-26 DIAGNOSIS — R6521 Severe sepsis with septic shock: Secondary | ICD-10-CM | POA: Diagnosis not present

## 2019-04-26 DIAGNOSIS — I10 Essential (primary) hypertension: Secondary | ICD-10-CM | POA: Diagnosis not present

## 2019-04-26 DIAGNOSIS — Z79899 Other long term (current) drug therapy: Secondary | ICD-10-CM | POA: Diagnosis not present

## 2019-04-26 DIAGNOSIS — Z7982 Long term (current) use of aspirin: Secondary | ICD-10-CM | POA: Diagnosis not present

## 2019-04-26 DIAGNOSIS — I959 Hypotension, unspecified: Secondary | ICD-10-CM | POA: Diagnosis not present

## 2019-04-26 DIAGNOSIS — R0602 Shortness of breath: Secondary | ICD-10-CM | POA: Diagnosis not present

## 2019-04-26 DIAGNOSIS — Z66 Do not resuscitate: Secondary | ICD-10-CM | POA: Diagnosis not present

## 2019-04-26 DIAGNOSIS — J438 Other emphysema: Secondary | ICD-10-CM | POA: Diagnosis not present

## 2019-04-26 DIAGNOSIS — N200 Calculus of kidney: Secondary | ICD-10-CM | POA: Diagnosis not present

## 2019-04-26 DIAGNOSIS — R0902 Hypoxemia: Secondary | ICD-10-CM | POA: Diagnosis not present

## 2019-04-26 DIAGNOSIS — J8410 Pulmonary fibrosis, unspecified: Secondary | ICD-10-CM | POA: Diagnosis not present

## 2019-04-26 DIAGNOSIS — G629 Polyneuropathy, unspecified: Secondary | ICD-10-CM | POA: Diagnosis present

## 2019-04-26 DIAGNOSIS — E872 Acidosis: Secondary | ICD-10-CM | POA: Diagnosis not present

## 2019-04-26 DIAGNOSIS — Z515 Encounter for palliative care: Secondary | ICD-10-CM | POA: Diagnosis not present

## 2019-04-26 DIAGNOSIS — Z20828 Contact with and (suspected) exposure to other viral communicable diseases: Secondary | ICD-10-CM | POA: Diagnosis not present

## 2019-04-26 DIAGNOSIS — Z87891 Personal history of nicotine dependence: Secondary | ICD-10-CM | POA: Diagnosis not present

## 2019-04-26 DIAGNOSIS — Z452 Encounter for adjustment and management of vascular access device: Secondary | ICD-10-CM | POA: Diagnosis not present

## 2019-04-26 DIAGNOSIS — J80 Acute respiratory distress syndrome: Secondary | ICD-10-CM | POA: Diagnosis not present

## 2019-04-26 DIAGNOSIS — I5032 Chronic diastolic (congestive) heart failure: Secondary | ICD-10-CM | POA: Diagnosis present

## 2019-04-26 DIAGNOSIS — I129 Hypertensive chronic kidney disease with stage 1 through stage 4 chronic kidney disease, or unspecified chronic kidney disease: Secondary | ICD-10-CM | POA: Diagnosis present

## 2019-04-26 DIAGNOSIS — K922 Gastrointestinal hemorrhage, unspecified: Secondary | ICD-10-CM | POA: Diagnosis not present

## 2019-04-26 DIAGNOSIS — N183 Chronic kidney disease, stage 3 (moderate): Secondary | ICD-10-CM | POA: Diagnosis present

## 2019-04-26 DIAGNOSIS — I701 Atherosclerosis of renal artery: Secondary | ICD-10-CM | POA: Diagnosis not present

## 2019-04-26 DIAGNOSIS — R918 Other nonspecific abnormal finding of lung field: Secondary | ICD-10-CM | POA: Diagnosis not present

## 2019-04-26 DIAGNOSIS — R0689 Other abnormalities of breathing: Secondary | ICD-10-CM | POA: Diagnosis not present

## 2019-04-26 DIAGNOSIS — A4152 Sepsis due to Pseudomonas: Secondary | ICD-10-CM | POA: Diagnosis not present

## 2019-04-26 DIAGNOSIS — N170 Acute kidney failure with tubular necrosis: Secondary | ICD-10-CM | POA: Diagnosis not present

## 2019-04-26 DIAGNOSIS — N179 Acute kidney failure, unspecified: Secondary | ICD-10-CM | POA: Diagnosis not present

## 2019-04-27 DIAGNOSIS — I10 Essential (primary) hypertension: Secondary | ICD-10-CM | POA: Diagnosis not present

## 2019-04-27 DIAGNOSIS — J438 Other emphysema: Secondary | ICD-10-CM | POA: Diagnosis not present

## 2019-04-27 DIAGNOSIS — E872 Acidosis: Secondary | ICD-10-CM | POA: Diagnosis not present

## 2019-04-27 DIAGNOSIS — A4150 Gram-negative sepsis, unspecified: Secondary | ICD-10-CM | POA: Diagnosis not present

## 2019-04-27 DIAGNOSIS — N179 Acute kidney failure, unspecified: Secondary | ICD-10-CM | POA: Diagnosis not present

## 2019-04-28 ENCOUNTER — Other Ambulatory Visit: Payer: Self-pay

## 2019-04-28 DIAGNOSIS — J90 Pleural effusion, not elsewhere classified: Secondary | ICD-10-CM | POA: Diagnosis not present

## 2019-04-28 DIAGNOSIS — I701 Atherosclerosis of renal artery: Secondary | ICD-10-CM

## 2019-04-28 NOTE — Progress Notes (Signed)
Notes recorded by Lorretta Harp, MD on 04/09/2019 at 1:12 PM EDT  RAR appears better on the left. Left renal dimensions unchanged. Repeat 6 months  Dx:  Renal artery stenosis

## 2019-04-30 DIAGNOSIS — N179 Acute kidney failure, unspecified: Secondary | ICD-10-CM | POA: Diagnosis not present

## 2019-04-30 DIAGNOSIS — N183 Chronic kidney disease, stage 3 (moderate): Secondary | ICD-10-CM | POA: Diagnosis not present

## 2019-04-30 DIAGNOSIS — J438 Other emphysema: Secondary | ICD-10-CM | POA: Diagnosis not present

## 2019-05-01 DIAGNOSIS — R918 Other nonspecific abnormal finding of lung field: Secondary | ICD-10-CM | POA: Diagnosis not present

## 2019-05-01 MED ORDER — PHENOL 1.4 % MT LIQD
2.00 | OROMUCOSAL | Status: DC
Start: ? — End: 2019-05-01

## 2019-05-01 MED ORDER — SODIUM CHLORIDE 0.9 % IV SOLN
10.00 | INTRAVENOUS | Status: DC
Start: ? — End: 2019-05-01

## 2019-05-01 MED ORDER — DEXAMETHASONE 4 MG PO TABS
6.00 | ORAL_TABLET | ORAL | Status: DC
Start: 2019-05-02 — End: 2019-05-01

## 2019-05-01 MED ORDER — ALBUTEROL SULFATE HFA 108 (90 BASE) MCG/ACT IN AERS
2.00 | INHALATION_SPRAY | RESPIRATORY_TRACT | Status: DC
Start: ? — End: 2019-05-01

## 2019-05-01 MED ORDER — BENZOCAINE-MENTHOL 15-3.6 MG MT LOZG
1.00 | LOZENGE | OROMUCOSAL | Status: DC
Start: ? — End: 2019-05-01

## 2019-05-01 MED ORDER — LACTATED RINGERS IV SOLN
75.00 | INTRAVENOUS | Status: DC
Start: ? — End: 2019-05-01

## 2019-05-01 MED ORDER — CLONIDINE HCL 0.1 MG PO TABS
.10 | ORAL_TABLET | ORAL | Status: DC
Start: ? — End: 2019-05-01

## 2019-05-01 MED ORDER — GENERIC EXTERNAL MEDICATION
Status: DC
Start: ? — End: 2019-05-01

## 2019-05-01 MED ORDER — ATORVASTATIN CALCIUM 80 MG PO TABS
80.00 | ORAL_TABLET | ORAL | Status: DC
Start: 2019-05-01 — End: 2019-05-01

## 2019-05-01 MED ORDER — MELATONIN 1 MG PO TABS
2.00 | ORAL_TABLET | ORAL | Status: DC
Start: 2019-05-01 — End: 2019-05-01

## 2019-05-01 MED ORDER — ASPIRIN EC 81 MG PO TBEC
81.00 | DELAYED_RELEASE_TABLET | ORAL | Status: DC
Start: 2019-05-02 — End: 2019-05-01

## 2019-05-01 MED ORDER — PREGABALIN 75 MG PO CAPS
75.00 | ORAL_CAPSULE | ORAL | Status: DC
Start: 2019-05-01 — End: 2019-05-01

## 2019-05-01 MED ORDER — ACETAMINOPHEN 325 MG PO TABS
650.00 | ORAL_TABLET | ORAL | Status: DC
Start: ? — End: 2019-05-01

## 2019-05-01 MED ORDER — SALINE NASAL SPRAY 0.65 % NA SOLN
1.00 | NASAL | Status: DC
Start: ? — End: 2019-05-01

## 2019-05-01 MED ORDER — UMECLIDINIUM-VILANTEROL 62.5-25 MCG/INH IN AEPB
1.00 | INHALATION_SPRAY | RESPIRATORY_TRACT | Status: DC
Start: 2019-05-02 — End: 2019-05-01

## 2019-05-01 MED ORDER — GENERIC EXTERNAL MEDICATION
.05 | Status: DC
Start: ? — End: 2019-05-01

## 2019-05-01 MED ORDER — GUAIFENESIN 100 MG/5ML PO LIQD
200.00 | ORAL | Status: DC
Start: ? — End: 2019-05-01

## 2019-05-01 MED ORDER — HEPARIN SODIUM (PORCINE) 5000 UNIT/ML IJ SOLN
5000.00 | INTRAMUSCULAR | Status: DC
Start: 2019-05-01 — End: 2019-05-01

## 2019-05-01 MED ORDER — AMLODIPINE BESYLATE 5 MG PO TABS
5.00 | ORAL_TABLET | ORAL | Status: DC
Start: 2019-05-02 — End: 2019-05-01

## 2019-05-01 MED ORDER — POLYETHYLENE GLYCOL 3350 17 G PO PACK
17.00 | PACK | ORAL | Status: DC
Start: ? — End: 2019-05-01

## 2019-05-01 MED ORDER — GENERIC EXTERNAL MEDICATION
3.38 | Status: DC
Start: 2019-05-01 — End: 2019-05-01

## 2019-05-01 MED ORDER — LABETALOL HCL 200 MG PO TABS
100.00 | ORAL_TABLET | ORAL | Status: DC
Start: 2019-05-01 — End: 2019-05-01

## 2019-05-01 MED ORDER — MUPIROCIN 2 % EX OINT
TOPICAL_OINTMENT | CUTANEOUS | Status: DC
Start: 2019-05-01 — End: 2019-05-01

## 2019-05-01 MED ORDER — PANTOPRAZOLE SODIUM 20 MG PO TBEC
20.00 | DELAYED_RELEASE_TABLET | ORAL | Status: DC
Start: 2019-05-02 — End: 2019-05-01

## 2019-05-01 MED ORDER — ALUM & MAG HYDROXIDE-SIMETH 200-200-20 MG/5ML PO SUSP
30.00 | ORAL | Status: DC
Start: ? — End: 2019-05-01

## 2019-05-01 MED ORDER — NITROGLYCERIN 0.4 MG SL SUBL
0.40 | SUBLINGUAL_TABLET | SUBLINGUAL | Status: DC
Start: ? — End: 2019-05-01

## 2019-05-02 DIAGNOSIS — U071 COVID-19: Secondary | ICD-10-CM | POA: Diagnosis not present

## 2019-05-02 DIAGNOSIS — I10 Essential (primary) hypertension: Secondary | ICD-10-CM | POA: Diagnosis not present

## 2019-05-03 DIAGNOSIS — J438 Other emphysema: Secondary | ICD-10-CM | POA: Diagnosis not present

## 2019-05-03 DIAGNOSIS — R918 Other nonspecific abnormal finding of lung field: Secondary | ICD-10-CM | POA: Diagnosis not present

## 2019-05-03 DIAGNOSIS — N200 Calculus of kidney: Secondary | ICD-10-CM | POA: Diagnosis not present

## 2019-05-03 DIAGNOSIS — N183 Chronic kidney disease, stage 3 (moderate): Secondary | ICD-10-CM | POA: Diagnosis not present

## 2019-05-03 DIAGNOSIS — N179 Acute kidney failure, unspecified: Secondary | ICD-10-CM | POA: Diagnosis not present

## 2019-05-03 DIAGNOSIS — A4150 Gram-negative sepsis, unspecified: Secondary | ICD-10-CM | POA: Diagnosis not present

## 2019-05-03 DIAGNOSIS — J8410 Pulmonary fibrosis, unspecified: Secondary | ICD-10-CM | POA: Diagnosis not present

## 2019-05-03 DIAGNOSIS — I701 Atherosclerosis of renal artery: Secondary | ICD-10-CM | POA: Diagnosis not present

## 2019-05-09 NOTE — Telephone Encounter (Signed)
Will you call husband and check on Catherine Phillips? I called last week. I can't see updates in careeverywhere. She was admitted to [facility] in ICU with [facility].

## 2019-05-12 NOTE — Telephone Encounter (Signed)
Pt died in ICU on august 16th. Can we please change her status?

## 2019-05-20 DEATH — deceased

## 2019-07-10 ENCOUNTER — Encounter (HOSPITAL_COMMUNITY): Payer: Medicare Other
